# Patient Record
Sex: Male | Born: 1965
Health system: Southern US, Community
[De-identification: ages and names within clinical notes are randomized; demographics above are authoritative.]

## PROBLEM LIST (undated history)

## (undated) ENCOUNTER — Emergency Department (HOSPITAL_COMMUNITY): Discharge status: Absent without leave | Payer: Self-pay

## (undated) DIAGNOSIS — R011 Cardiac murmur, unspecified: Secondary | ICD-10-CM

## (undated) DIAGNOSIS — A938 Other specified arthropod-borne viral fevers: Secondary | ICD-10-CM

## (undated) DIAGNOSIS — F32A Depression, unspecified: Secondary | ICD-10-CM

## (undated) DIAGNOSIS — F1010 Alcohol abuse, uncomplicated: Secondary | ICD-10-CM

## (undated) DIAGNOSIS — I252 Old myocardial infarction: Secondary | ICD-10-CM

## (undated) DIAGNOSIS — G4730 Sleep apnea, unspecified: Secondary | ICD-10-CM

## (undated) DIAGNOSIS — I2510 Atherosclerotic heart disease of native coronary artery without angina pectoris: Secondary | ICD-10-CM

## (undated) DIAGNOSIS — F419 Anxiety disorder, unspecified: Secondary | ICD-10-CM

## (undated) HISTORY — DX: Depression, unspecified: F32.A

## (undated) HISTORY — DX: Anxiety disorder, unspecified: F41.9

## (undated) HISTORY — PX: KNEE SURGERY: SHX244

## (undated) HISTORY — PX: HERNIA REPAIR: SHX51

## (undated) HISTORY — DX: Sleep apnea, unspecified: G47.30

## (undated) HISTORY — DX: Cardiac murmur, unspecified: R01.1

---

## 1998-04-04 ENCOUNTER — Emergency Department (HOSPITAL_COMMUNITY): Admission: EM | Admit: 1998-04-04 | Discharge: 1998-04-04 | Payer: Self-pay | Admitting: Emergency Medicine

## 2000-09-08 ENCOUNTER — Emergency Department (HOSPITAL_COMMUNITY): Admission: EM | Admit: 2000-09-08 | Discharge: 2000-09-08 | Payer: Self-pay | Admitting: Emergency Medicine

## 2000-09-08 ENCOUNTER — Encounter: Payer: Self-pay | Admitting: Emergency Medicine

## 2000-11-02 ENCOUNTER — Encounter: Payer: Self-pay | Admitting: Emergency Medicine

## 2000-11-02 ENCOUNTER — Emergency Department (HOSPITAL_COMMUNITY): Admission: EM | Admit: 2000-11-02 | Discharge: 2000-11-02 | Payer: Self-pay | Admitting: Emergency Medicine

## 2000-12-30 ENCOUNTER — Encounter: Payer: Self-pay | Admitting: *Deleted

## 2000-12-30 ENCOUNTER — Emergency Department (HOSPITAL_COMMUNITY): Admission: EM | Admit: 2000-12-30 | Discharge: 2000-12-30 | Payer: Self-pay | Admitting: Emergency Medicine

## 2002-10-19 ENCOUNTER — Emergency Department (HOSPITAL_COMMUNITY): Admission: EM | Admit: 2002-10-19 | Discharge: 2002-10-20 | Payer: Self-pay | Admitting: Emergency Medicine

## 2002-10-19 ENCOUNTER — Encounter: Payer: Self-pay | Admitting: Emergency Medicine

## 2003-08-13 ENCOUNTER — Emergency Department (HOSPITAL_COMMUNITY): Admission: EM | Admit: 2003-08-13 | Discharge: 2003-08-13 | Payer: Self-pay | Admitting: Emergency Medicine

## 2003-12-08 ENCOUNTER — Emergency Department (HOSPITAL_COMMUNITY): Admission: EM | Admit: 2003-12-08 | Discharge: 2003-12-09 | Payer: Self-pay | Admitting: Emergency Medicine

## 2003-12-11 ENCOUNTER — Inpatient Hospital Stay (HOSPITAL_COMMUNITY): Admission: EM | Admit: 2003-12-11 | Discharge: 2003-12-18 | Payer: Self-pay | Admitting: *Deleted

## 2005-05-25 ENCOUNTER — Emergency Department (HOSPITAL_COMMUNITY): Admission: EM | Admit: 2005-05-25 | Discharge: 2005-05-26 | Payer: Self-pay | Admitting: Emergency Medicine

## 2007-08-04 ENCOUNTER — Emergency Department (HOSPITAL_COMMUNITY): Admission: EM | Admit: 2007-08-04 | Discharge: 2007-08-04 | Payer: Self-pay | Admitting: Emergency Medicine

## 2007-09-02 ENCOUNTER — Encounter: Payer: Self-pay | Admitting: Internal Medicine

## 2007-09-02 ENCOUNTER — Ambulatory Visit: Payer: Self-pay | Admitting: Pulmonary Disease

## 2007-09-02 ENCOUNTER — Ambulatory Visit: Payer: Self-pay | Admitting: Internal Medicine

## 2007-09-02 ENCOUNTER — Inpatient Hospital Stay (HOSPITAL_COMMUNITY): Admission: EM | Admit: 2007-09-02 | Discharge: 2007-09-10 | Payer: Self-pay | Admitting: Emergency Medicine

## 2007-09-03 ENCOUNTER — Encounter: Payer: Self-pay | Admitting: Internal Medicine

## 2007-09-03 ENCOUNTER — Encounter (INDEPENDENT_AMBULATORY_CARE_PROVIDER_SITE_OTHER): Payer: Self-pay | Admitting: Internal Medicine

## 2007-09-03 LAB — CONVERTED CEMR LAB
HCV Ab: NEGATIVE
Hep B C IgM: NEGATIVE
TSH: 2.531 microintl units/mL

## 2007-09-08 ENCOUNTER — Encounter: Payer: Self-pay | Admitting: Pulmonary Disease

## 2007-09-09 ENCOUNTER — Encounter: Payer: Self-pay | Admitting: Pulmonary Disease

## 2007-09-10 ENCOUNTER — Encounter: Payer: Self-pay | Admitting: Internal Medicine

## 2007-09-10 ENCOUNTER — Telehealth: Payer: Self-pay | Admitting: *Deleted

## 2007-09-10 ENCOUNTER — Encounter: Payer: Self-pay | Admitting: Pulmonary Disease

## 2007-09-10 LAB — CONVERTED CEMR LAB
CO2: 26 meq/L
Calcium: 8.9 mg/dL
Chloride: 103 meq/L
Creatinine, Ser: 1.33 mg/dL
Hemoglobin: 13.4 g/dL
Platelets: 293 10*3/uL
Sodium: 136 meq/L
WBC, blood: 4.1 10*3/uL

## 2007-09-16 ENCOUNTER — Encounter: Payer: Self-pay | Admitting: Internal Medicine

## 2007-09-16 DIAGNOSIS — R071 Chest pain on breathing: Secondary | ICD-10-CM

## 2007-09-16 DIAGNOSIS — A5139 Other secondary syphilis of skin: Secondary | ICD-10-CM

## 2007-09-17 ENCOUNTER — Ambulatory Visit: Payer: Self-pay | Admitting: Hospitalist

## 2007-09-30 ENCOUNTER — Ambulatory Visit: Payer: Self-pay | Admitting: Infectious Disease

## 2008-09-06 ENCOUNTER — Emergency Department (HOSPITAL_COMMUNITY): Admission: EM | Admit: 2008-09-06 | Discharge: 2008-09-06 | Payer: Self-pay | Admitting: Emergency Medicine

## 2009-03-27 ENCOUNTER — Emergency Department (HOSPITAL_COMMUNITY): Admission: EM | Admit: 2009-03-27 | Discharge: 2009-03-27 | Payer: Self-pay | Admitting: Emergency Medicine

## 2010-01-14 ENCOUNTER — Emergency Department (HOSPITAL_COMMUNITY): Admission: EM | Admit: 2010-01-14 | Discharge: 2010-01-14 | Payer: Self-pay | Admitting: Emergency Medicine

## 2010-09-07 LAB — POCT I-STAT, CHEM 8
Calcium, Ion: 1.13 mmol/L (ref 1.12–1.32)
Chloride: 98 mEq/L (ref 96–112)
TCO2: 28 mmol/L (ref 0–100)

## 2010-11-05 NOTE — Discharge Summary (Signed)
NAMECESAR, Dean Howard            ACCOUNT NO.:  [id number]   MEDICAL RECORD NO.:  [id number]          PATIENT TYPE:  INP   LOCATION:  5509                         FACILITY:  [facility]   PHYSICIAN:  Madaline Guthrie, M.D.    DATE OF BIRTH:  [birth year]   DATE OF ADMISSION:  09/02/2007  DATE OF DISCHARGE:  09/10/2007                               DISCHARGE SUMMARY   DIAGNOSES AT DISCHARGE:  1. Constitutional syndrome, including weight loss, diffuse      lymphadenopathy, alopecia.  2. Secondary syphilis.  3. Atypical pneumonia, bilateral.   DISCHARGE MEDICATION:  Ibuprofen 600 mg p.o. t.i.d. for 2-4 weeks.   DISPOSITION AND FOLLOWUP:  The patient is to follow up on Friday, September 17, 2007, at the outpatient clinic for benzathine penicillin 1.2 million  units IM in each buttock x1 (that is benzathine penicillin 2.4 million  mmu total).  The patient is also to follow up in the outpatient clinic  on September 30, 2007, at 3:15 p.m. for a hospital followup.  During the  visit, the patient's rash should be assessed along with right-sided  pleuritic chest pain and alopecia.   PROCEDURES PERFORMED:  The patient had a bronchoscopy by Dr. Vassie Loll on  September 09, 2007.  Findings:  Upper airway appeared normal.  Vocal cords  have normal appearance and motion.  All segmental and subsegmental  airways appeared normal.  No endobronchial secretions or lesions seen.  BAL obtained from right lower lobe.  Brushings from posteromedial  segment of right lower lobe under fluoroscopy, transbronchial biopsy x3  from the same area.   CONSULTATION:  Comer Locket. Vassie Loll, MD, from [facility].   BRIEF ADMITTING HISTORY AND PHYSICAL:  Dean Howard is a pleasant 45-  year-old man with a past medical history of [place] spotted fever  at age 33, asthma, and collar-button abscess with MRSA-positive  cultures.  He now comes in with right-sided pleuritic chest pain.  Pain  has been constant since January,  initially throbbing, now sharp, no  palliation including ibuprofen 800 mg, worsened with deep breaths,  movement, cough, and sneezing.  Currently, pain is 8/10 in intensity,  10/10 at its worst.  The patient has never had this pain before and it  does not radiate, except occasionally and present on the left side as  well.  The patient also has hoarseness of his voice, shortness of  breath, productive cough with yellow sputum, and thinning of hair since  January.  Of note, all these symptoms started after a diffuse papular  rash erupted on the chest and spread out to the limbs in November 2008.  At that time, the patient states also he had an increased watery,  itching, and swelling of the eyes.  The rash was pruritic, papular, and  did not involve the palms, soles, or mouth.  The patient also had  pronounced subjective weight loss.  He denies any fevers, chills,  abdominal pain, or recent travel.  The patient does have a history of  staying in a shelter, jail, and recent HIV test (never followed up with  results) and PPD  negative per the patient in January 2009.   PHYSICAL EXAMINATION:  Temperature 98.1, blood pressure 113/74, pulse  69, and O2 saturation 99% on room air.  GENERAL:  The patient was in no acute distress, middle-aged male.  EYES:  Bilateral palpebral edema.  Sclerae were anicteric.  Positive  bilateral gaze nystagmus.  ENT:  Oropharynx was erythematous, but no plaques, edema, or exudates.  Loss of external eyebrows noticed.  NECK:  Supple, diffuse submandibular and submental lymphadenopathy.  Also, a cervical lymphadenopathy noted.  No JVD.  No carotid bruits.  CHEST:  Respiration with good air movement, but slightly decreased at  bases with bilateral basilar crackles, right greater than left.  The  patient appeared in moderate respiratory distress, was slightly  tachypneic pausing to catch his breath.  CARDIOVASCULAR:  Regular rate and rhythm.  No murmurs, gallops, or  rubs.  Peripheral pulses 2+.  GI:  Positive bowel sounds.  Soft, nontender, nondistended.  No palpable  masses or organomegaly.  EXTREMITIES:  No clubbing, dark macules on the bottom of the feet that  were nonblanching.  GU:  No CVA tenderness, dry/desquamative rash on glans penis.  SKIN:  Diffuse healing papular rash with a white center and excoriation  changes secondary to scratching.  LYMPHATICS:  There were no cervical/trochlear/axillary lymph nodes  palpated.  Shotty lymphadenopathy noted again at the submental,  submandibular, cervical, and bilateral groin.  NEURO:  The patient was awake, alert, and oriented x3.  Cranial nerves  II through XII grossly intact.  Motor intact.  Sensation intact.  Cerebellar grossly intact.  PSYCH:  Appropriate.   LABORATORIES ON ADMISSION:  Sodium 136, potassium 4.0, chloride 101,  bicarbonate 27, BUN 11, creatinine 1.23, glucose 74, bilirubin 0.8,  alkaline phosphatase 316, AST 43, ALT 39.  Protein 8.3, albumin 3.5,  calcium 9.4.  WBC 5.1, ANC 2.7, hemoglobin 14.8, MCV 85, platelets  270,000.   HOSPITAL COURSE BY PROBLEMS:  1. Right-sided pleuritic chest pain with productive cough.  It was      felt likely secondary to pneumonia given symptoms and chest x-ray      with bilateral lower lobe densities predominantly involving the      right lower lobe.  The patient initially was put on Avelox for      likely atypical process given length of the symptoms.  Other      opportunistic infections were considered given HIV status unknown.      Therefore, HIV antibody was ordered and was negative.  Given HIV      seronegativity, the patient was to complete a course of Avelox      which he did so throughout the hospitalization.  The patient was      also put on respiratory isolation for possible TB.  Sputum was sent      for AFB and PCP staining and culture.  TB smears were negative.      First AFB stain was negative, and no other respiratory cultures       were obtainable.  The patient was also checked for syphilis with a      positive RPR with titer of 1:256.  This was confirmed with a TPA      that was positive with value greater than 8.  Given the patient's      symptoms are not greatly improved as far as the right-sided      pleuritic chest pain, pulmonary was consulted since CT angiogram  demonstrated no pulmonary embolus and persistent bibasilar airspace      disease with slight improvement on the right.  Pulmonary was      consulted for bronchoscopy for BAL with AFB stains and culture.      The patient had bronchoscopy on September 09, 2007, which was normal in      appearance.  The patient had a BAL sent for AFB, fungal, and      cytology.  Given the patient's AFB stains were negative from the      bronchoscopy, the patient was discharged on 600 mg p.o. t.i.d. for      2-4 weeks with presumptive pleurisy from questionable atypical      pneumonia in the past.  The patient is to follow up in the clinic      to assess the resolution of symptoms.  Of note, the patient also      had a C-reactive protein done that was elevated at 1.5.  Urine      Legionella antigen that was negative.  Acute hepatitis panel that      was negative.  TSH was within normal limits, as well as the T4.      GC/Chlamydia urine probes were both negative.  Epstein-Barr virus      serum PCR was negative.  2. Secondary syphilis.  Given the patient's seropositivity with RPR, a      titer of 1:256, and positive TPA, it was felt that the patient's      constitutional symptoms including weight loss, lymphadenopathy, and      alopecia were all due to the secondary syphilis.  The patient was      treated with benzathine penicillin weekly for 3 doses.  The patient      received 2 of the doses in the hospital and is to follow up on      Friday, September 17, 2007, in the outpatient clinic for the third dose      of benzathine penicillin.  The patient did state improvement in  all      the constitutional symptoms.  No further workup was pursued given      likelihood of secondary syphilis as a source of the constitutional      symptoms.   LABORATORIES ON DISCHARGE:  Sodium 136, potassium 4.0, chloride 103,  bicarbonate 26, glucose 88, BUN 15, creatinine 1.33, and calcium 8.9.  WBC 4.1, hemoglobin 13.4, and platelets 293,000.      Mariea Stable, MD  Electronically Signed      Madaline Guthrie, M.D.  Electronically Signed    MA/MEDQ  D:  09/16/2007  T:  09/17/2007  Job:  [id number]   cc:   Oretha Milch, MD  Mariea Stable, MD

## 2010-11-05 NOTE — Consult Note (Signed)
NAMEDILLIAN, FEIG            ACCOUNT NO.:  [id number]   MEDICAL RECORD NO.:  [id number]          PATIENT TYPE:  INP   LOCATION:  5509                         FACILITY:  [facility]   PHYSICIAN:  Oretha Milch, MD      DATE OF BIRTH:  [birth year]   DATE OF CONSULTATION:  09/08/2007  DATE OF DISCHARGE:                                 CONSULTATION   REFERRING PHYSICIAN:  Mariea Stable, MD   REASON FOR CONSULTATION:  Bronchoscopy for unresolved pneumonia.   HISTORY OF PRESENT ILLNESS:  Mr. Mathey is a 45 year old African  American gentleman who presented with right-sided anterior pleuritic  chest pain. He states that he does not go into the hospital unless there  is a serious problem. This has been constant and ongoing since January.  Ibuprofen does not help much. It is worse with deep breaths and with  sneezing. Occasionally it radiates to his back. He also presented with a  diffuse papular rash, pruritic, weight loss of about 20 pounds, and hair  loss noted from the scalp and his eyebrows. He was found to have an RPR  of 1: 256  and a positive FT antibody and was treated with benzathine  penicillin for secondary syphilis. Sputum x1 was negative for AFB and  for pneumocystis. HIV was negative. Hepatitis panel was negative.  Legionella antigen in the urine was negative. Sputum culture has been  positive for streptococcus group G. Chest x-ray showed bibasilar  airspace disease. CT of the chest showed bilateral lower lobe  consolidations and axillary lymphadenopathy. CT of the abdomen and  pelvis showed diffuse wall thickening of the transverse and left colon  suggestive of colitis. There was mild inguinal lymphadenopathy and  possible sacroiliitis.   We are consulted for a bronchoscopy to rule out TB in view of his  persistent pleuritic chest pain. He has been treated with Avelox for the  past five days. PPD  status is negative by report.   PAST MEDICAL HISTORY:  1. [place]  [place] spotted fever at age 89.  2. Asthma.  3. collar stud  abscess in his left hand status post incision and      drainage by Dr. Amanda Pea in June 2005.   ALLERGIES:  None.   CURRENT MEDICATIONS:  1. Benzathine penicillin.  2. Moxifloxacin 400 mg IV every 24.  3. __________  1 to 2 tablets every 4 hours p.r.n.  4. Diphenhydramine/Benadryl 25 mg IV every 4 hours p.r.n.   SOCIAL HISTORY:  He smokes about half a pack per day. He used to work in  [employer] and moved to [place] to live with his parents November  2008. He has lived in the shelter in the past and has been incarcerated  for threatening my girlfriend.   FAMILY HISTORY:  Breast cancer in his mother, prostate cancer in his  father.   REVIEW OF SYSTEMS:  Twenty pound weight loss in the last three months.  Right-sided anterior chest pain occasionally radiating to his back.  Denies dyspnea, cough, sputum production. He has a papular rash that  started on his chest and spread  to his legs and arms involving the palms  and soles. Pruritic rash  as per patient.   PHYSICAL EXAMINATION:  Alert gentleman setting up in bed in no acute  respiratory  distress. Afebrile since admission. HR 75 respirations 18,  blood pressure BP 132  /61, oxygen saturation 97% on room air.  HEENT:  No thrush. Pupils 3mm and reactive to light. No pallor. No  icterus.  NECK:  Supple. No JVD. No meningeal signs. __________ .  CARDIOVASCULAR:  S1, S2 normal.  CHEST:  Crackles at the left base.  ABDOMEN:  Soft, nontender.  NEUROLOGIC:  Nonfocal.  EXTREMITIES:  No edema.  SKIN:  Diffuse healing papular rash with signs of excoriation and some  scaling over the right arm.   LABORATORY DATA:  WBC 3.6, hemoglobin 13.2, platelets 305, BUN and  creatinine 9/1.2, sodium 139, potassium 4, chloride 105, bicarbonate 27,  calcium 9.4.   IMPRESSION:  1. Persistent bibasilar airspace disease.  2. Secondary syphilis treated with penicillin which explains  the rash      and the lymphadenopathy and his systemic symptoms.  3. Pleuritic pain and weight loss remain unexplained.   RECOMMENDATION:  As per request of the medical team, bronchoscopy will  be performed. We will obtain specimens from the right lower lobe with  lavage. Transbronchial biopsy was discussed with the patient. The risks  of the procedure including coughing, bleeding, and the small chance of  lung puncture requiring a chest tube were discussed, and he evidenced  understanding.      Oretha Milch, MD  Electronically Signed     RVA/MEDQ  D:  09/08/2007  T:  09/08/2007  Job:  [id number]

## 2010-11-06 ENCOUNTER — Emergency Department (HOSPITAL_COMMUNITY)
Admission: EM | Admit: 2010-11-06 | Discharge: 2010-11-07 | Disposition: A | Discharge status: Home / Self Care | Payer: Self-pay | Attending: Emergency Medicine | Admitting: Emergency Medicine

## 2010-11-06 ENCOUNTER — Emergency Department (HOSPITAL_COMMUNITY): Payer: Self-pay

## 2010-11-06 DIAGNOSIS — IMO0002 Reserved for concepts with insufficient information to code with codable children: Secondary | ICD-10-CM | POA: Insufficient documentation

## 2010-11-06 DIAGNOSIS — X58XXXA Exposure to other specified factors, initial encounter: Secondary | ICD-10-CM | POA: Insufficient documentation

## 2010-11-06 DIAGNOSIS — R21 Rash and other nonspecific skin eruption: Secondary | ICD-10-CM | POA: Insufficient documentation

## 2010-11-06 DIAGNOSIS — S91309A Unspecified open wound, unspecified foot, initial encounter: Secondary | ICD-10-CM | POA: Insufficient documentation

## 2010-11-08 NOTE — Op Note (Signed)
NAME:  Dean Howard, Dean Howard                      ACCOUNT NO.:  [id number]   MEDICAL RECORD NO.:  [id number]                   PATIENT TYPE:  INP   LOCATION:  6713                                 FACILITY:  [facility]   PHYSICIAN:  Dionne Ano. Everlene Other, M.D.         DATE OF BIRTH:  [birth year]   DATE OF PROCEDURE:  12/13/2003  DATE OF DISCHARGE:                                 OPERATIVE REPORT   PREOPERATIVE DIAGNOSIS:  Fourth web space collar button abscess.   POSTOPERATIVE DIAGNOSIS:  Fourth web space collar button abscess.   PROCEDURE:  Irrigation and debridement deep abscess left hand, collar button  abscess in nature.   SURGEON:  Dionne Ano. Amanda Pea, M.D.   ASSISTANT:  Karie Chimera, P.A.-C.   COMPLICATIONS:  None.   ANESTHESIA:  General.   SPECIMENS:  Aerobic and anaerobic cultures taken.   DRAINS:  Placed.   COMPLICATIONS:  None.   INDICATIONS FOR PROCEDURE:  The patient is a 45 year old male who presents  for I&D of a collar button abscess that had previous I&D 48 hours ago.  Given the wound conditions, I feel it is imperative to wash him out again as  this is a fairly significant infection with continued drainage and positive  Staph aureus abundant growing.  He understands the risks and benefits and  desires to proceed.   OPERATIVE PROCEDURE:  The patient was seen by myself and anesthesia, taken  to the operating room where he underwent a smooth induction of general  anesthesia.  He was laid supine, appropriately padded, prepped and draped in  the usual sterile fashion.  I then entered the previously incised area.  I  extended the incision somewhat and removed skin, subcutaneous tissue, and  any prenecrotic tissue.  I then went dorsal and volar to the intermetacarpal  ligament and identified some reaccumulation.  I then copiously irrigated  with greater than 2 liters of saline, packed the wound with Iodoform, and  did not close the wound, of course.  The extensor  apparatus was identified  and there was no significant substance in the sheath.  Following this, the  patient was packed nicely, placed in a sterile splint, extubated and taken  to the recovery room.  We will monitor him closely and continue IV  antibiotics, elevation, and range of motion to the fingers.  All questions  have been encouraged and answered.                                               Dionne Ano. Everlene Other, M.D.    Nash Mantis  D:  12/13/2003  T:  12/14/2003  Job:  [id number]

## 2010-11-08 NOTE — Discharge Summary (Signed)
NAME:  ASIA, FAVATA                      ACCOUNT NO.:  [id number]   MEDICAL RECORD NO.:  [id number]                   PATIENT TYPE:  INP   LOCATION:  6713                                 FACILITY:  [facility]   PHYSICIAN:  Dionne Ano. Amanda Pea, M.D.             DATE OF BIRTH:  [birth year]   DATE OF ADMISSION:  12/11/2003  DATE OF DISCHARGE:  12/18/2003                                 DISCHARGE SUMMARY   ADMITTING DIAGNOSIS:  Left hand collar button abscess.   DISCHARGE DIAGNOSIS:  Left hand collar button abscess, improved.   SURGEON:  Dionne Ano. Amanda Pea, M.D.   PROCEDURE:  I&D x2 of the left collar button abscess.   CONSULTS:  None.   BRIEF HOSPITAL COURSE:  Mr. Orourke is a 45 year old gentleman who was  noted to have a deep abscess about the left 4th web space; this had been  present many days prior to this admittance at the hospital, had  significantly worsened with ascending cellulitis, lymphangitis and pain, and  was seen and evaluated by Dr. Amanda Pea.  It was determined that the patient  certainly needed surgical intervention in the form of an I&D with most  likely a repeat washout.  The patient's preoperative labs were obtained and  he was stable to undergo surgery.   HOSPITAL COURSE:  The patient was admitted on December 11, 2003 and underwent an  I&D of the left hand 4th web space; he was noted to have a deep collar  button abscess; he tolerated this quite well.  He was admitted for standard  postoperative care including IV antibiotics in the form of clindamycin as  well as elevation, edema control and hydrotherapy.  Given the presentation,  certainly a repeat washout was in order and would be planned within 24-48  hours.  On postoperative day #2, the patient was doing well, he was alert  and oriented, vital signs were stable, he was afebrile and the wound was  still quite edematous, but without significant drainage.  Preliminary  cultures showed Staph aureus; his antibiotic  regime was changed to  vancomycin to prophylactically cover this, given the fact this was more than  likely MRSA, given his presentation.  He underwent a repeat I&D without  complications.  On postoperative day #3, he was doing well, he had minimal  tenderness, he had no nausea, vomiting, fever or chills, vital signs were  stable and he was afebrile.  Dressings were removed; he had minimal  discharge, more sanguineous discharge, but no purulent discharge was  expressed.  He had moderate edema.  Neurovascularly, he was intact,  sensation intact and he had excellent range of motion without signs of  Kanavel's.  Final cultures were obtained that showed abundant MRSA,  sensitive to vancomycin, gentamicin, rifampin and tetracycline; clindamycin  was pending.  The patient continued whirlpool and therapeutic endeavors  throughout his stay and on December 18, 2003, he was doing much better, he  was  improved overall, his vital signs were stable and he was afebrile.  The  decision was made to discharge him home.   ASSESSMENT:  Status post I&D x2 of left fourth web space abscess with  cultures indicative of MRSA.   PLAN:   CONDITION ON DISCHARGE:  Improved.   DIET:  Diet is regular.   ACTIVITIES:  He will keep his dressings clean, dry and intact, but will need  b.i.d. dressing changes, wet-to-dry, and therapists have instructed him on  how to perform wet-to-dry changes and have provided him with home supplies  including saline.  He will continue range of motion endeavors.   DISCHARGE MEDICATIONS:  His discharge medications will be:  1.  Doxycycline 100 mg 1 p.o. b.i.d. x3 weeks.  2.  He was given Vicodin 5/500 mg 1-2 p.o. q.4-6 h. p.r.n. pain, #30 of      those.   FOLLOWUP:  Will follow up with Dr. Amanda Pea in 1 week.      Karie Chimera, P.A.-C.                   Dionne Ano. Amanda Pea, M.D.    BB/MEDQ  D:  03/01/2004  T:  03/02/2004  Job:  [id number]

## 2010-11-08 NOTE — Op Note (Signed)
NAME:  Dean Howard, Dean Howard                      ACCOUNT NO.:  [id number]   MEDICAL RECORD NO.:  [id number]                   PATIENT TYPE:  INP   LOCATION:  6713                                 FACILITY:  [facility]   PHYSICIAN:  Dionne Ano. Everlene Other, M.D.         DATE OF BIRTH:  Nov 01, 1965   DATE OF PROCEDURE:  12/11/2003  DATE OF DISCHARGE:                                 OPERATIVE REPORT   PREOPERATIVE DIAGNOSIS:  Collar button abscess, left hand about the fourth  web.   POSTOPERATIVE DIAGNOSIS:  Collar button abscess, left hand about the fourth  web.   OPERATION PERFORMED:  Incision and drainage collar button abscess, fourth  webspace.   SURGEON:  Dionne Ano. Amanda Pea, M.D.   ASSISTANT:  Karie Chimera, P.A.-C.   ANESTHESIA:  Local with IV sedation as necessary.   COMPLICATIONS:  None.   TOURNIQUET TIME:  0.   CULTURES:  Aerobic and anaerobic taken.   INDICATIONS FOR PROCEDURE:  The patient is a 45 year old male who presents  with the above mentioned diagnosis.  I have counseled the patient in regard  to the risks and benefits of surgery and he desires to proceed.  I discussed  with him he had an abscess.  I would recommend drainage.  This abscess  unfortunately occurred many days ago.  This is significantly worsened with  ascending cellulitis, lymphangitis and pain.  He has no frank Kanavel's  signs but certainly has signs indicative of collar button abscess.  The  patient notes no unusual exposures.  He does note a puncture wound history  10 days ago he feels.   DESCRIPTION OF PROCEDURE:  The patient was seen by myself and was thoroughly  evaluated.  He underwent consent for operative intervention.  He  appropriately consented to the procedure of course and following this, we  performed a local block with lidocaine without epinephrine.  Following this,  he underwent a Betadine scrub and paint and once a sterile field was  secured, underwent incision.  The incision was  taken through the skin  following going through the dermal, tissue.  We then encountered an abscess  in the subcutaneous tissues and deeper tissues.  I swept a hemostat on  either side of the intermetacarpal ligament freeing up the abscess which was  very prominent and noticeable. Following this, aerobic and anaerobic  cultures were taken and sent.  Once this was done, he underwent copious  thorough lavage  with saline.  Following this, the wound was packed open  with iodoform gauze.  Once this done, he then underwent application of  sterile dressing.  Following this, the patient was then admitted to  the hospital for IV antibiotics, elevation, dressing changes, pain control,  etc.  He has a significant infection.  He will need to be admitted for  intravenous antibiotics, elevation, general precautions, possible second  wash out, etc.  All questions have been encouraged and answered.  Dionne Ano. Everlene Other, M.D.    Nash Mantis  D:  12/11/2003  T:  12/12/2003  Job:  [id number]

## 2011-03-14 LAB — I-STAT 8, (EC8 V) (CONVERTED LAB)
Acid-Base Excess: 4 — ABNORMAL HIGH
Hemoglobin: 15.3
Operator id: 198171
TCO2: 31
pCO2, Ven: 47.3
pH, Ven: 7.404 — ABNORMAL HIGH

## 2011-03-14 LAB — POCT I-STAT CREATININE: Creatinine, Ser: 1.4

## 2011-03-17 LAB — COMPREHENSIVE METABOLIC PANEL
ALT: 39
Albumin: 3.5
Alkaline Phosphatase: 316 — ABNORMAL HIGH
Calcium: 9.4
Chloride: 101
Creatinine, Ser: 1.23
Glucose, Bld: 74
Sodium: 136
Total Bilirubin: 0.8
Total Protein: 8.3

## 2011-03-17 LAB — DIFFERENTIAL
Basophils Absolute: 0.1
Basophils Relative: 1
Basophils Relative: 1
Eosinophils Absolute: 0.4
Eosinophils Absolute: 0.3
Eosinophils Relative: 7 — ABNORMAL HIGH
Lymphocytes Relative: 29
Lymphocytes Relative: 35
Lymphs Abs: 1.5
Lymphs Abs: 1.4
Monocytes Absolute: 0.5
Monocytes Relative: 11
Monocytes Relative: 13 — ABNORMAL HIGH
Monocytes Relative: 16 — ABNORMAL HIGH
Neutro Abs: 2.7
Neutro Abs: 1.6 — ABNORMAL LOW
Neutrophils Relative %: 53
Neutrophils Relative %: 31 — ABNORMAL LOW
Neutrophils Relative %: 40 — ABNORMAL LOW

## 2011-03-17 LAB — BASIC METABOLIC PANEL
BUN: 11
BUN: 10
BUN: 12
CO2: 26
CO2: 27
CO2: 29
Calcium: 8.9
Calcium: 8.9
Calcium: 9.4
Chloride: 104
Chloride: 103
Chloride: 103
Chloride: 106
Creatinine, Ser: 1.22
Creatinine, Ser: 1.22
Creatinine, Ser: 1.23
Creatinine, Ser: 1.33
GFR calc Af Amer: >60
GFR calc Af Amer: >60
GFR calc Af Amer: >60
GFR calc non Af Amer: >60
GFR calc non Af Amer: 60 — ABNORMAL LOW
Glucose, Bld: 85
Glucose, Bld: 85
Potassium: 4.1
Sodium: 138

## 2011-03-17 LAB — URINALYSIS, ROUTINE W REFLEX MICROSCOPIC
Glucose, UA: NEGATIVE
Hgb urine dipstick: NEGATIVE
Specific Gravity, Urine: 1.02

## 2011-03-17 LAB — AFB CULTURE WITH SMEAR (NOT AT ARMC): Acid Fast Smear: NONE SEEN

## 2011-03-17 LAB — CBC
HCT: 38 — ABNORMAL LOW
HCT: 43.5
Hemoglobin: 12.8 — ABNORMAL LOW
Hemoglobin: 14.8
MCHC: 33.5
MCHC: 34.1
MCV: 84.9
MCV: 85.7
MCV: 85
Platelets: 267
Platelets: 303
RBC: 4.44
RBC: 5.12
RBC: 4.54
RBC: 4.63
RDW: 18.1 — ABNORMAL HIGH
RDW: 17.9 — ABNORMAL HIGH
RDW: 18 — ABNORMAL HIGH
WBC: 5.1
WBC: 5.4
WBC: 4.1

## 2011-03-17 LAB — CARDIAC PANEL(CRET KIN+CKTOT+MB+TROPI)
CK, MB: 1.1
Relative Index: INVALID
Total CK: 73

## 2011-03-17 LAB — URINE CULTURE
Culture: NO GROWTH
Special Requests: NEGATIVE

## 2011-03-17 LAB — CULTURE, RESPIRATORY W GRAM STAIN: Gram Stain: NONE SEEN

## 2011-03-17 LAB — EXPECTORATED SPUTUM ASSESSMENT W GRAM STAIN, RFLX TO RESP C

## 2011-03-17 LAB — P CARINII SMEAR DFA

## 2011-03-17 LAB — HEPATITIS PANEL, ACUTE
Hep A IgM: NEGATIVE
Hep B C IgM: NEGATIVE
Hepatitis B Surface Ag: NEGATIVE

## 2011-03-17 LAB — GC/CHLAMYDIA PROBE AMP, URINE
Chlamydia, Swab/Urine, PCR: NEGATIVE
GC Probe Amp, Urine: NEGATIVE

## 2011-03-17 LAB — CULTURE, BLOOD (ROUTINE X 2): Culture: NO GROWTH

## 2011-03-17 LAB — LEGIONELLA ANTIGEN, URINE: Legionella Antigen, Urine: NEGATIVE

## 2011-03-17 LAB — FUNGUS CULTURE W SMEAR: Fungal Smear: NONE SEEN

## 2011-03-17 LAB — SYPHILIS: RPR W/REFLEX TO RPR TITER AND TREPONEMAL ANTIBODIES, TRADITIONAL SCREENING AND DIAGNOSIS ALGORITHM: RPR Ser Ql: REACTIVE — AB

## 2011-03-17 LAB — EPSTEIN BARR VIRUS(EBV) BY PCR: EBV DNA, PCR: NEGATIVE

## 2011-03-17 LAB — RPR TITER: RPR Titer: 1:256 {titer} — AB

## 2011-03-17 LAB — TSH: TSH: 2.531

## 2011-03-17 LAB — CK TOTAL AND CKMB (NOT AT ARMC)
CK, MB: 1
Total CK: 84

## 2011-03-17 LAB — HIV ANTIBODY (ROUTINE TESTING W REFLEX): HIV: NONREACTIVE

## 2011-03-17 LAB — TROPONIN I: Troponin I: 0.01

## 2011-03-17 LAB — T.PALLIDUM AB, IGG: T pallidum Antibodies (TP-PA): >8 — ABNORMAL HIGH

## 2011-03-17 LAB — T4, FREE: Free T4: 0.68 — ABNORMAL LOW

## 2014-01-18 ENCOUNTER — Emergency Department (HOSPITAL_COMMUNITY)
Admission: EM | Admit: 2014-01-18 | Discharge: 2014-01-18 | Disposition: A | Discharge status: Home / Self Care | Payer: Self-pay | Attending: Emergency Medicine | Admitting: Emergency Medicine

## 2014-01-18 ENCOUNTER — Encounter (HOSPITAL_COMMUNITY): Payer: Self-pay | Admitting: Emergency Medicine

## 2014-01-18 DIAGNOSIS — L253 Unspecified contact dermatitis due to other chemical products: Secondary | ICD-10-CM | POA: Insufficient documentation

## 2014-01-18 DIAGNOSIS — L259 Unspecified contact dermatitis, unspecified cause: Secondary | ICD-10-CM

## 2014-01-18 DIAGNOSIS — R21 Rash and other nonspecific skin eruption: Secondary | ICD-10-CM | POA: Insufficient documentation

## 2014-01-18 DIAGNOSIS — IMO0002 Reserved for concepts with insufficient information to code with codable children: Secondary | ICD-10-CM | POA: Insufficient documentation

## 2014-01-18 DIAGNOSIS — F17200 Nicotine dependence, unspecified, uncomplicated: Secondary | ICD-10-CM | POA: Insufficient documentation

## 2014-01-18 MED ORDER — PREDNISONE 20 MG PO TABS
60.0000 mg | ORAL_TABLET | Freq: Once | ORAL | Status: AC
  Administered 2014-01-18: 60 mg via ORAL
  Filled 2014-01-18: qty 3

## 2014-01-18 MED ORDER — PREDNISONE 20 MG PO TABS: ORAL_TABLET | ORAL | Status: DC

## 2014-01-18 NOTE — Discharge Instructions (Signed)
Please read and follow all provided instructions.  Your diagnoses today include:  1. Contact dermatitis     Tests performed today include:  Vital signs. See below for your results today.   Medications prescribed:   Prednisone - steroid medicine   It is best to take this medication in the morning to prevent sleeping problems. If you are diabetic, monitor your blood sugar closely and stop taking Prednisone if blood sugar is over 300. Take with food to prevent stomach upset.   Home care instructions:  Follow any educational materials contained in this packet.  Follow-up instructions: Please follow-up with your primary care provider as needed for further evaluation of your symptoms.  Return instructions:   Please return to the Emergency Department if you experience worsening symptoms.   Please return if you have any other emergent concerns.  Additional Information:  Your vital signs today were: BP 118/77   Pulse 66   Temp(Src) 97.6 F (36.4 C) (Oral)   Resp 17   Ht 6\' 3"  (1.905 m)   Wt 171 lb (77.565 kg)   BMI 21.37 kg/m2   SpO2 100% If your blood pressure (BP) was elevated above 135/85 this visit, please have this repeated by your doctor within one month. ---------------

## 2014-01-18 NOTE — ED Notes (Signed)
Pt reports rash to arms, neck and trunk x 2 weeks. Reports itching, rash looks like possible poison ivy. Airway intact.

## 2014-01-18 NOTE — ED Provider Notes (Signed)
Medical screening examination/treatment/procedure(s) were performed by non-physician practitioner and as supervising physician I was immediately available for consultation/collaboration.   EKG Interpretation None        William Linley Moskal, MD 01/18/14 1445 

## 2014-01-18 NOTE — ED Provider Notes (Signed)
CSN: [id number]     Arrival date & time 01/18/14  0754 History   First MD Initiated Contact with Patient 01/18/14 [phone]     Chief Complaint  Patient presents with  . Rash     (Consider location/radiation/quality/duration/timing/severity/associated sxs/prior Treatment) HPI Comments: Patients with history of itchy rash on exposed areas including upper extremities, lower extremities, neck, and forehead for one week. Patient works in [employer], [employer] and was spraying for weeds outside of a house one week ago prior to symptom onset. No pain. No eye involvement. Patient denies fever, nausea, vomiting. No tick bites or rash on palms or soles. Patient has been using over-the-counter cortisone cream with some relief of symptoms. No other symptoms of illness. Patient denies history of diabetes.  Patient is a 48 y.o. male presenting with rash. The history is provided by the patient.  Rash Associated symptoms: no fever, no myalgias, no nausea, no shortness of breath, not vomiting and not wheezing     History reviewed. No pertinent past medical history. History reviewed. No pertinent past surgical history. History reviewed. No pertinent family history. History  Substance Use Topics  . Smoking status: Current Every Day Smoker    Types: Cigarettes  . Smokeless tobacco: Not on file  . Alcohol Use: Yes    Review of Systems  Constitutional: Negative for fever.  HENT: Negative for facial swelling and trouble swallowing.   Eyes: Negative for redness.  Respiratory: Negative for shortness of breath, wheezing and stridor.   Cardiovascular: Negative for chest pain.  Gastrointestinal: Negative for nausea and vomiting.  Musculoskeletal: Negative for myalgias.  Skin: Positive for rash.  Neurological: Negative for light-headedness.  Psychiatric/Behavioral: Negative for confusion.      Allergies  Review of patient's allergies indicates no known allergies.  Home Medications   Prior to Admission  medications   Medication Sig Start Date End Date Taking? Authorizing Provider  hydrocortisone cream 1 % Apply 1 application topically as needed for itching.   Yes Historical Provider, MD  predniSONE (DELTASONE) 20 MG tablet 3 Tabs PO Days 1-3, then 2 tabs PO Days 4-6, then 1 tab PO Day 7-9, then Half Tab PO Day 10-12 01/18/14   Renne Crigler, PA-C   BP 118/77  Pulse 66  Temp(Src) 97.6 F (36.4 C) (Oral)  Resp 17  Ht 6\' 3"  (1.905 m)  Wt 171 lb (77.565 kg)  BMI 21.37 kg/m2  SpO2 100% Physical Exam  Nursing note and vitals reviewed. Constitutional: He appears well-developed and well-nourished.  HENT:  Head: Normocephalic and atraumatic.  Eyes: Conjunctivae are normal.  Neck: Normal range of motion. Neck supple.  Pulmonary/Chest: No respiratory distress.  Neurological: He is alert.  Skin: Skin is warm and dry.  No rash on palms or soles. Patient with scattered areas of vesicles, mostly in a linear pattern, over exposed areas of arms, lower legs, neck, forehead.  Psychiatric: He has a normal mood and affect.    ED Course  Procedures (including critical care time) Labs Review Labs Reviewed - No data to display  Imaging Review No results found.   EKG Interpretation None      8:17 AM Patient seen and examined. Medications ordered.   Vital signs reviewed and are as follows: Filed Vitals:   01/18/14 0801  BP: 118/77  Pulse: 66  Temp: 97.6 F (36.4 C)  Resp: 17   Patient denies history of diabetes, GI bleed, allergy to prednisone.  Patient urged to return with worsening symptoms or other concerns.  Patient verbalized understanding and agrees with plan.     MDM   Final diagnoses:  Contact dermatitis   Patients with linear vesicular rash consistent with contact dermatitis, likely due to plant exposure. No signs of anaphylaxis or other serious allergic reaction.    Renne CriglerJoshua Larraine Argo, PA-C 01/18/14 [phone]

## 2014-01-28 ENCOUNTER — Emergency Department (HOSPITAL_COMMUNITY): Payer: Self-pay

## 2014-01-28 ENCOUNTER — Encounter (HOSPITAL_COMMUNITY): Payer: Self-pay | Admitting: Emergency Medicine

## 2014-01-28 ENCOUNTER — Emergency Department (HOSPITAL_COMMUNITY)
Admission: EM | Admit: 2014-01-28 | Discharge: 2014-01-28 | Disposition: A | Discharge status: Home / Self Care | Payer: Self-pay | Attending: Emergency Medicine | Admitting: Emergency Medicine

## 2014-01-28 DIAGNOSIS — F17200 Nicotine dependence, unspecified, uncomplicated: Secondary | ICD-10-CM | POA: Insufficient documentation

## 2014-01-28 DIAGNOSIS — S0181XA Laceration without foreign body of other part of head, initial encounter: Secondary | ICD-10-CM

## 2014-01-28 DIAGNOSIS — S02109B Fracture of base of skull, unspecified side, initial encounter for open fracture: Secondary | ICD-10-CM | POA: Insufficient documentation

## 2014-01-28 DIAGNOSIS — S01409A Unspecified open wound of unspecified cheek and temporomandibular area, initial encounter: Secondary | ICD-10-CM | POA: Insufficient documentation

## 2014-01-28 DIAGNOSIS — S02401B Maxillary fracture, unspecified, initial encounter for open fracture: Secondary | ICD-10-CM

## 2014-01-28 MED ORDER — OXYCODONE-ACETAMINOPHEN 5-325 MG PO TABS
1.0000 | ORAL_TABLET | Freq: Once | ORAL | Status: AC
  Administered 2014-01-28: 1 via ORAL
  Filled 2014-01-28: qty 1

## 2014-01-28 MED ORDER — CEPHALEXIN 500 MG PO CAPS: 500.0000 mg | ORAL_CAPSULE | Freq: Four times a day (QID) | ORAL | Status: DC

## 2014-01-28 MED ORDER — OXYCODONE-ACETAMINOPHEN 5-325 MG PO TABS: 1.0000 | ORAL_TABLET | Freq: Four times a day (QID) | ORAL | Status: DC | PRN

## 2014-01-28 NOTE — Discharge Instructions (Signed)
Facial Fracture °A facial fracture is a break in one of the bones of your face. °HOME CARE INSTRUCTIONS  °· Protect the injured part of your face until it is healed. °· Do not participate in activities which give chance for re-injury until your doctor approves. °· Gently wash and dry your face. °· Wear head and facial protection while riding a bicycle, motorcycle, or snowmobile. °SEEK MEDICAL CARE IF:  °· An oral temperature above 102° F (38.9° C) develops. °· You have severe headaches or notice changes in your vision. °· You have new numbness or tingling in your face. °· You develop nausea (feeling sick to your stomach), vomiting or a stiff neck. °SEEK IMMEDIATE MEDICAL CARE IF:  °· You develop difficulty seeing or experience double vision. °· You become dizzy, lightheaded, or faint. °· You develop trouble speaking, breathing, or swallowing. °· You have a watery discharge from your nose or ear. °MAKE SURE YOU:  °· Understand these instructions. °· Will watch your condition. °· Will get help right away if you are not doing well or get worse. °Document Released: 06/09/2005 Document Revised: 09/01/2011 Document Reviewed: 01/27/2008 °ExitCare® Patient Information ©2015 [employer]. This information is not intended to replace advice given to you by your health care provider. Make sure you discuss any questions you have with your health care provider. ° °Facial Laceration ° A facial laceration is a cut on the face. These injuries can be painful and cause bleeding. Lacerations usually heal quickly, but they need special care to reduce scarring. °DIAGNOSIS  °Your health care provider will take a medical history, ask for details about how the injury occurred, and examine the wound to determine how deep the cut is. °TREATMENT  °Some facial lacerations may not require closure. Others may not be able to be closed because of an increased risk of infection. The risk of infection and the chance for successful closure will  depend on various factors, including the amount of time since the injury occurred. °The wound may be cleaned to help prevent infection. If closure is appropriate, pain medicines may be given if needed. Your health care provider will use stitches (sutures), wound glue (adhesive), or skin adhesive strips to repair the laceration. These tools bring the skin edges together to allow for faster healing and a better cosmetic outcome. If needed, you may also be given a tetanus shot. °HOME CARE INSTRUCTIONS °· Only take over-the-counter or prescription medicines as directed by your health care provider. °· Follow your health care provider's instructions for wound care. These instructions will vary depending on the technique used for closing the wound. °For Sutures: °· Keep the wound clean and dry.   °· If you were given a bandage (dressing), you should change it at least once a day. Also change the dressing if it becomes wet or dirty, or as directed by your health care provider.   °· Wash the wound with soap and water 2 times a day. Rinse the wound off with water to remove all soap. Pat the wound dry with a clean towel.   °· After cleaning, apply a thin layer of the antibiotic ointment recommended by your health care provider. This will help prevent infection and keep the dressing from sticking.   °· You may shower as usual after the first 24 hours. Do not soak the wound in water until the sutures are removed.   °· Get your sutures removed as directed by your health care provider. With facial lacerations, sutures should usually be taken out after   days to avoid stitch marks.   Wait a few days after your sutures are removed before applying any makeup. For Skin Adhesive Strips:  Keep the wound clean and dry.   Do not get the skin adhesive strips wet. You may bathe carefully, using caution to keep the wound dry.   If the wound gets wet, pat it dry with a clean towel.   Skin adhesive strips will fall off on  their own. You may trim the strips as the wound heals. Do not remove skin adhesive strips that are still stuck to the wound. They will fall off in time.  For Wound Adhesive:  You may briefly wet your wound in the shower or bath. Do not soak or scrub the wound. Do not swim. Avoid periods of heavy sweating until the skin adhesive has fallen off on its own. After showering or bathing, gently pat the wound dry with a clean towel.   Do not apply liquid medicine, cream medicine, ointment medicine, or makeup to your wound while the skin adhesive is in place. This may loosen the film before your wound is healed.   If a dressing is placed over the wound, be careful not to apply tape directly over the skin adhesive. This may cause the adhesive to be pulled off before the wound is healed.   Avoid prolonged exposure to sunlight or tanning lamps while the skin adhesive is in place.  The skin adhesive will usually remain in place for 5-10 days, then naturally fall off the skin. Do not pick at the adhesive film.  After Healing: Once the wound has healed, cover the wound with sunscreen during the day for 1 full year. This can help minimize scarring. Exposure to ultraviolet light in the first year will darken the scar. It can take 1-2 years for the scar to lose its redness and to heal completely.  SEEK IMMEDIATE MEDICAL CARE IF:  You have redness, pain, or swelling around the wound.   You see ayellowish-white fluid (pus) coming from the wound.   You have chills or a fever.  MAKE SURE YOU:  Understand these instructions.  Will watch your condition.  Will get help right away if you are not doing well or get worse. Document Released: 07/17/2004 Document Revised: 03/30/2013 Document Reviewed: 01/20/2013 [facility] Patient Information 2015 [place], [place]. This information is not intended to replace advice given to you by your health care provider. Make sure you discuss any questions you have with your  health care provider.
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
## 2014-01-28 NOTE — ED Notes (Signed)
Dr. Wilkie AyeHorton at bedside to suture laceration.

## 2015-05-23 ENCOUNTER — Emergency Department (HOSPITAL_COMMUNITY)
Admission: EM | Admit: 2015-05-23 | Discharge: 2015-05-23 | Disposition: A | Discharge status: Home / Self Care | Payer: Self-pay | Attending: Emergency Medicine | Admitting: Emergency Medicine

## 2015-05-23 ENCOUNTER — Encounter (HOSPITAL_COMMUNITY): Payer: Self-pay | Admitting: Emergency Medicine

## 2015-05-23 DIAGNOSIS — I252 Old myocardial infarction: Secondary | ICD-10-CM | POA: Insufficient documentation

## 2015-05-23 DIAGNOSIS — I2510 Atherosclerotic heart disease of native coronary artery without angina pectoris: Secondary | ICD-10-CM | POA: Insufficient documentation

## 2015-05-23 DIAGNOSIS — Z792 Long term (current) use of antibiotics: Secondary | ICD-10-CM | POA: Insufficient documentation

## 2015-05-23 DIAGNOSIS — Z87891 Personal history of nicotine dependence: Secondary | ICD-10-CM | POA: Insufficient documentation

## 2015-05-23 DIAGNOSIS — A084 Viral intestinal infection, unspecified: Secondary | ICD-10-CM | POA: Insufficient documentation

## 2015-05-23 HISTORY — DX: Old myocardial infarction: I25.2

## 2015-05-23 HISTORY — DX: Atherosclerotic heart disease of native coronary artery without angina pectoris: I25.10

## 2015-05-23 LAB — CBC
HEMATOCRIT: 42.4 % (ref 39.0–52.0)
HEMOGLOBIN: 14.6 g/dL (ref 13.0–17.0)
MCH: 31.5 pg (ref 26.0–34.0)
MCHC: 34.4 g/dL (ref 30.0–36.0)
MCV: 91.4 fL (ref 78.0–100.0)
Platelets: 149 10*3/uL — ABNORMAL LOW (ref 150–400)
RBC: 4.64 MIL/uL (ref 4.22–5.81)
RDW: 15 % (ref 11.5–15.5)
WBC: 3.1 10*3/uL — ABNORMAL LOW (ref 4.0–10.5)

## 2015-05-23 LAB — URINALYSIS, ROUTINE W REFLEX MICROSCOPIC
GLUCOSE, UA: NEGATIVE mg/dL
Hgb urine dipstick: NEGATIVE
KETONES UR: 40 mg/dL — AB
Nitrite: NEGATIVE
PH: 6 (ref 5.0–8.0)
Protein, ur: NEGATIVE mg/dL
SPECIFIC GRAVITY, URINE: 1.031 — AB (ref 1.005–1.030)

## 2015-05-23 LAB — COMPREHENSIVE METABOLIC PANEL
ALBUMIN: 3.4 g/dL — AB (ref 3.5–5.0)
ALK PHOS: 51 U/L (ref 38–126)
ALT: 69 U/L — ABNORMAL HIGH (ref 17–63)
ANION GAP: 8 (ref 5–15)
AST: 107 U/L — ABNORMAL HIGH (ref 15–41)
BILIRUBIN TOTAL: 1 mg/dL (ref 0.3–1.2)
BUN: 14 mg/dL (ref 6–20)
CALCIUM: 8.6 mg/dL — AB (ref 8.9–10.3)
CO2: 26 mmol/L (ref 22–32)
Chloride: 100 mmol/L — ABNORMAL LOW (ref 101–111)
Creatinine, Ser: 1.59 mg/dL — ABNORMAL HIGH (ref 0.61–1.24)
GFR calc Af Amer: 58 mL/min — ABNORMAL LOW (ref >60)
GFR, EST NON AFRICAN AMERICAN: 50 mL/min — AB (ref >60)
GLUCOSE: 86 mg/dL (ref 65–99)
POTASSIUM: 3.6 mmol/L (ref 3.5–5.1)
Sodium: 134 mmol/L — ABNORMAL LOW (ref 135–145)
TOTAL PROTEIN: 6.2 g/dL — AB (ref 6.5–8.1)

## 2015-05-23 LAB — URINE MICROSCOPIC-ADD ON

## 2015-05-23 MED ORDER — METOCLOPRAMIDE HCL 5 MG/ML IJ SOLN
10.0000 mg | INTRAMUSCULAR | Status: AC
  Administered 2015-05-23: 10 mg via INTRAVENOUS
  Filled 2015-05-23: qty 2

## 2015-05-23 MED ORDER — KETOROLAC TROMETHAMINE 30 MG/ML IJ SOLN
30.0000 mg | Freq: Once | INTRAMUSCULAR | Status: AC
  Administered 2015-05-23: 30 mg via INTRAVENOUS
  Filled 2015-05-23: qty 1

## 2015-05-23 MED ORDER — ONDANSETRON HCL 4 MG PO TABS: 4.0000 mg | ORAL_TABLET | Freq: Three times a day (TID) | ORAL | Status: DC | PRN

## 2015-05-23 MED ORDER — DIPHENHYDRAMINE HCL 50 MG/ML IJ SOLN
25.0000 mg | Freq: Once | INTRAMUSCULAR | Status: AC
  Administered 2015-05-23: 25 mg via INTRAVENOUS
  Filled 2015-05-23: qty 1

## 2015-05-23 MED ORDER — SODIUM CHLORIDE 0.9 % IV BOLUS (SEPSIS)
1000.0000 mL | Freq: Once | INTRAVENOUS | Status: AC
  Administered 2015-05-23: 1000 mL via INTRAVENOUS

## 2015-05-23 NOTE — ED Notes (Signed)
PA at bedside.

## 2015-05-23 NOTE — ED Provider Notes (Signed)
CSN: [id number]     Arrival date & time 05/23/15  [id number] History   First MD Initiated Contact with Patient 05/23/15 [phone]     Chief Complaint  Patient presents with  . Emesis  . Diarrhea  . Abdominal Pain     (Consider location/radiation/quality/duration/timing/severity/associated sxs/prior Treatment) HPI Comments: Patient is a 49 year old male who presents with a 3 day history of abdominal pain. The pain is located in his generalized abdomen and does not radiate. The pain is described as cramping and severe. The pain started gradually and progressively worsened since the onset. No alleviating/aggravating factors. The patient has tried nothing for symptoms without relief. Associated symptoms include NVD and headache. Patient denies fever, chest pain, SOB, dysuria, constipation. No history of abdominal surgery. No known sick contacts.   Patient is a 49 y.o. male presenting with vomiting, diarrhea, and abdominal pain.  Emesis Associated symptoms: abdominal pain and diarrhea   Associated symptoms: no arthralgias and no chills   Diarrhea Associated symptoms: abdominal pain and vomiting   Associated symptoms: no arthralgias, no chills and no fever   Abdominal Pain Associated symptoms: diarrhea, nausea and vomiting   Associated symptoms: no chest pain, no chills, no dysuria, no fatigue, no fever and no shortness of breath     Past Medical History  Diagnosis Date  . Coronary artery disease   . MI, old    Past Surgical History  Procedure Laterality Date  . Knee surgery     No family history on file. Social History  Substance Use Topics  . Smoking status: Current Every Day Smoker    Types: Cigarettes  . Smokeless tobacco: None  . Alcohol Use: Yes    Review of Systems  Constitutional: Negative for fever, chills and fatigue.  HENT: Negative for trouble swallowing.   Eyes: Negative for visual disturbance.  Respiratory: Negative for shortness of breath.   Cardiovascular: Negative  for chest pain and palpitations.  Gastrointestinal: Positive for nausea, vomiting, abdominal pain and diarrhea.  Genitourinary: Negative for dysuria and difficulty urinating.  Musculoskeletal: Negative for arthralgias and neck pain.  Skin: Negative for color change.  Neurological: Negative for dizziness and weakness.  Psychiatric/Behavioral: Negative for dysphoric mood.      Allergies  Review of patient's allergies indicates no known allergies.  Home Medications   Prior to Admission medications   Medication Sig Start Date End Date Taking? Authorizing Provider  cephALEXin (KEFLEX) 500 MG capsule Take 1 capsule (500 mg total) by mouth 4 (four) times daily. 01/28/14   Shon Batonourtney F Horton, MD  hydrocortisone cream 1 % Apply 1 application topically as needed for itching.    Historical Provider, MD  oxyCODONE-acetaminophen (PERCOCET/ROXICET) 5-325 MG per tablet Take 1 tablet by mouth every 6 (six) hours as needed for severe pain. 01/28/14   Shon Batonourtney F Horton, MD   BP 132/96 mmHg  Pulse 85  Temp(Src) 98.6 F (37 C)  Resp 16  Ht 6\' 2"  (1.88 m)  Wt 71.215 kg  BMI 20.15 kg/m2  SpO2 99% Physical Exam  Constitutional: He is oriented to person, place, and time. He appears well-developed and well-nourished. No distress.  HENT:  Head: Normocephalic and atraumatic.  Eyes: Conjunctivae and EOM are normal.  Neck: Normal range of motion.  Cardiovascular: Normal rate and regular rhythm.  Exam reveals no gallop and no friction rub.   No murmur heard. Pulmonary/Chest: Effort normal and breath sounds normal. He has no wheezes. He has no rales. He exhibits no tenderness.  Abdominal: Soft. He exhibits no distension. There is tenderness.  Mild diffuse abdominal tenderness to palpation. No focal tenderness.   Musculoskeletal: Normal range of motion.  Neurological: He is alert and oriented to person, place, and time. Coordination normal.  Speech is goal-oriented. Moves limbs without ataxia.   Skin: Skin  is warm and dry.  Psychiatric: He has a normal mood and affect. His behavior is normal.  Nursing note and vitals reviewed.   ED Course  Procedures (including critical care time) Labs Review Labs Reviewed  CBC - Abnormal; Notable for the following:    WBC 3.1 (*)    Platelets 149 (*)    All other components within normal limits  URINALYSIS, ROUTINE W REFLEX MICROSCOPIC (NOT AT [facility]) - Abnormal; Notable for the following:    Color, Urine ORANGE (*)    Specific Gravity, Urine 1.031 (*)    Bilirubin Urine MODERATE (*)    Ketones, ur 40 (*)    Leukocytes, UA TRACE (*)    All other components within normal limits  COMPREHENSIVE METABOLIC PANEL - Abnormal; Notable for the following:    Sodium 134 (*)    Chloride 100 (*)    Creatinine, Ser 1.59 (*)    Calcium 8.6 (*)    Total Protein 6.2 (*)    Albumin 3.4 (*)    AST 107 (*)    ALT 69 (*)    GFR calc non Af Amer 50 (*)    GFR calc Af Amer 58 (*)    All other components within normal limits  URINE MICROSCOPIC-ADD ON - Abnormal; Notable for the following:    Squamous Epithelial / LPF 0-5 (*)    Bacteria, UA RARE (*)    All other components within normal limits    Imaging Review No results found. I have personally reviewed and evaluated these images and lab results as part of my medical decision-making.   EKG Interpretation   Date/Time:  Wednesday May 23 2015 08:38:50 EST Ventricular Rate:  79 PR Interval:  161 QRS Duration: 95 QT Interval:  402 QTC Calculation: 461 R Axis:   -69 Text Interpretation:  Sinus rhythm Interpretation limited secondary to  artifact Right atrial enlargement Left anterior fascicular block Probable  anteroseptal infarct, old No old tracing to compare Confirmed by KOHUT   MD, STEPHEN ([id number]) on 05/23/2015 8:41:48 AM      MDM   Final diagnoses:  Viral gastroenteritis    8:51 AM Labs pending. Vitals stable and patient afebrile. No neuro deficits at this time. Patient likely has a  migraine and abdominal pain from a viral illness. Patient will have migraine cocktail and IV fluids.   11:53 AM Labs and urine show dehydration without other acute changes on infection. Patient feeling better after migraine cocktail. Patient likely has a viral illness and will be treated symptomatically. Vitals stable and patient afebrile. Patient instructed to drink fluids and rest. Patient will be discharged with zofran.     Emilia Beck, PA-C 05/23/15 1154  Raeford Razor, MD 05/26/15 2057

## 2015-05-23 NOTE — Discharge Instructions (Signed)
Take zofran as needed for nausea. Refer to attached documents for more information. Return to the ED with worsening or concerning symptoms.  °

## 2015-05-23 NOTE — ED Notes (Signed)
From home via [facility] for abd pain, V/D X3 days, VSS, CBG 103  4 mg Zofran
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
## 2015-09-06 ENCOUNTER — Emergency Department (HOSPITAL_COMMUNITY)
Admission: EM | Admit: 2015-09-06 | Discharge: 2015-09-06 | Disposition: A | Discharge status: Home / Self Care | Payer: Self-pay | Attending: Emergency Medicine | Admitting: Emergency Medicine

## 2015-09-06 ENCOUNTER — Emergency Department (HOSPITAL_COMMUNITY): Payer: Self-pay

## 2015-09-06 ENCOUNTER — Encounter (HOSPITAL_COMMUNITY): Payer: Self-pay | Admitting: Emergency Medicine

## 2015-09-06 DIAGNOSIS — Y9289 Other specified places as the place of occurrence of the external cause: Secondary | ICD-10-CM | POA: Insufficient documentation

## 2015-09-06 DIAGNOSIS — I252 Old myocardial infarction: Secondary | ICD-10-CM | POA: Insufficient documentation

## 2015-09-06 DIAGNOSIS — I2510 Atherosclerotic heart disease of native coronary artery without angina pectoris: Secondary | ICD-10-CM | POA: Insufficient documentation

## 2015-09-06 DIAGNOSIS — S92502A Displaced unspecified fracture of left lesser toe(s), initial encounter for closed fracture: Secondary | ICD-10-CM

## 2015-09-06 DIAGNOSIS — S92522A Displaced fracture of medial phalanx of left lesser toe(s), initial encounter for closed fracture: Secondary | ICD-10-CM | POA: Insufficient documentation

## 2015-09-06 DIAGNOSIS — F17210 Nicotine dependence, cigarettes, uncomplicated: Secondary | ICD-10-CM | POA: Insufficient documentation

## 2015-09-06 DIAGNOSIS — Y9389 Activity, other specified: Secondary | ICD-10-CM | POA: Insufficient documentation

## 2015-09-06 DIAGNOSIS — Y999 Unspecified external cause status: Secondary | ICD-10-CM | POA: Insufficient documentation

## 2015-09-06 MED ORDER — ACETAMINOPHEN 325 MG PO TABS
650.0000 mg | ORAL_TABLET | Freq: Once | ORAL | Status: AC
  Administered 2015-09-06: 650 mg via ORAL
  Filled 2015-09-06: qty 2

## 2015-09-06 MED ORDER — NAPROXEN 500 MG PO TABS: 500.0000 mg | ORAL_TABLET | Freq: Two times a day (BID) | ORAL | Status: DC
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
## 2015-09-06 NOTE — Discharge Instructions (Signed)
Toe Fracture °A toe fracture is a break in one of the toe bones (phalanges). °CAUSES °This condition may be caused by: °· Dropping a heavy object on your toe. °· Stubbing your toe. °· Overusing your toe or doing repetitive exercise. °· Twisting or stretching your toe out of place. °RISK FACTORS °This condition is more likely to develop in people who: °· Play contact sports. °· Have a bone disease. °· Have a low calcium level. °SYMPTOMS °The main symptoms of this condition are swelling and pain in the toe. The pain may get worse with standing or walking. Other symptoms include: °· Bruising. °· Stiffness. °· Numbness. °· A change in the way the toe looks. °· Broken bones that poke through the skin. °· Blood beneath the toenail. °DIAGNOSIS °This condition is diagnosed with a physical exam. You may also have X-rays. °TREATMENT  °Treatment for this condition depends on the type of fracture and its severity. Treatment may involve: °· Taping the broken toe to a toe that is next to it (buddy taping). This is the most common treatment for fractures in which the bone has not moved out of place (nondisplaced fracture). °· Wearing a shoe that has a wide, rigid sole to protect the toe and to limit its movement. °· Wearing a walking cast. °· Having a procedure to move the toe back into place. °· Surgery. This may be needed: °¨ If there are many pieces of broken bone that are out of place (displaced). °¨ If the toe joint breaks. °¨ If the bone breaks through the skin. °· Physical therapy. This is done to help regain movement and strength in the toe. °You may need follow-up X-rays to make sure that the bone is healing well and staying in position. °HOME CARE INSTRUCTIONS °If You Have a Cast: °· Do not stick anything inside the cast to scratch your skin. Doing that increases your risk of infection. °· Check the skin around the cast every day. Report any concerns to your health care provider. You may put lotion on dry skin around the  edges of the cast. Do not apply lotion to the skin underneath the cast. °· Do not put pressure on any part of the cast until it is fully hardened. This may take several hours. °· Keep the cast clean and dry. °Bathing °· Do not take baths, swim, or use a hot tub until your health care provider approves. Ask your health care provider if you can take showers. You may only be allowed to take sponge baths for bathing. °· If your health care provider approves bathing and showering, cover the cast or bandage (dressing) with a watertight plastic bag to protect it from water. Do not let the cast or dressing get wet. °Managing Pain, Stiffness, and Swelling °· If you do not have a cast, apply ice to the injured area, if directed. °¨ Put ice in a plastic bag. °¨ Place a towel between your skin and the bag. °¨ Leave the ice on for 20 minutes, 2-3 times per day. °· Move your toes often to avoid stiffness and to lessen swelling. °· Raise (elevate) the injured area above the level of your heart while you are sitting or lying down. °Driving °· Do not drive or operate heavy machinery while taking pain medicine. °· Do not drive while wearing a cast on a foot that you use for driving. °Activity °· Return to your normal activities as directed by your health care provider. Ask your health care   provider what activities are safe for you. °· Perform exercises daily as directed by your health care provider or physical therapist. °Safety °· Do not use the injured limb to support your body weight until your health care provider says that you can. Use crutches or other assistive devices as directed by your health care provider. °General Instructions °· If your toe was treated with buddy taping, follow your health care provider's instructions for changing the gauze and tape. Change it more often: °¨ The gauze and tape get wet. If this happens, dry the space between the toes. °¨ The gauze and tape are too tight and cause your toe to become pale  or numb. °· Wear a protective shoe as directed by your health care provider. If you were not given a protective shoe, wear sturdy, supportive shoes. Your shoes should not pinch your toes and should not fit tightly against your toes. °· Do not use any tobacco products, including cigarettes, chewing tobacco, or e-cigarettes. Tobacco can delay bone healing. If you need help quitting, ask your health care provider. °· Take medicines only as directed by your health care provider. °· Keep all follow-up visits as directed by your health care provider. This is important. °SEEK MEDICAL CARE IF: °· You have a fever. °· Your pain medicine is not helping. °· Your toe is cold. °· Your toe is numb. °· You still have pain after one week of rest and treatment. °· You still have pain after your health care provider has said that you can start walking again. °· You have pain, tingling, or numbness in your foot that is not going away. °SEEK IMMEDIATE MEDICAL CARE IF: °· You have severe pain. °· You have redness or inflammation in your toe that is getting worse. °· You have pain or numbness in your toe that is getting worse. °· Your toe turns blue. °  °This information is not intended to replace advice given to you by your health care provider. Make sure you discuss any questions you have with your health care provider. °  °Document Released: 06/06/2000 Document Revised: 02/28/2015 Document Reviewed: 04/05/2014 °Elsevier Interactive Patient Education ©2016 Elsevier Inc. ° °

## 2015-09-06 NOTE — ED Notes (Signed)
Declined W/C at D/C and was escorted to lobby by RN. 
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
## 2015-10-04 ENCOUNTER — Emergency Department (HOSPITAL_COMMUNITY)
Admission: EM | Admit: 2015-10-04 | Discharge: 2015-10-04 | Disposition: A | Discharge status: Home / Self Care | Payer: No Typology Code available for payment source | Attending: Emergency Medicine | Admitting: Emergency Medicine

## 2015-10-04 ENCOUNTER — Encounter (HOSPITAL_COMMUNITY): Payer: Self-pay

## 2015-10-04 ENCOUNTER — Emergency Department (HOSPITAL_COMMUNITY): Payer: No Typology Code available for payment source

## 2015-10-04 DIAGNOSIS — S92522P Displaced fracture of medial phalanx of left lesser toe(s), subsequent encounter for fracture with malunion: Secondary | ICD-10-CM | POA: Insufficient documentation

## 2015-10-04 DIAGNOSIS — Z791 Long term (current) use of non-steroidal anti-inflammatories (NSAID): Secondary | ICD-10-CM | POA: Diagnosis not present

## 2015-10-04 DIAGNOSIS — F17210 Nicotine dependence, cigarettes, uncomplicated: Secondary | ICD-10-CM | POA: Insufficient documentation

## 2015-10-04 DIAGNOSIS — Z79899 Other long term (current) drug therapy: Secondary | ICD-10-CM | POA: Diagnosis not present

## 2015-10-04 DIAGNOSIS — I252 Old myocardial infarction: Secondary | ICD-10-CM | POA: Insufficient documentation

## 2015-10-04 DIAGNOSIS — M79675 Pain in left toe(s): Secondary | ICD-10-CM | POA: Diagnosis present

## 2015-10-04 DIAGNOSIS — I2510 Atherosclerotic heart disease of native coronary artery without angina pectoris: Secondary | ICD-10-CM | POA: Insufficient documentation

## 2015-10-04 DIAGNOSIS — S92912P Unspecified fracture of left toe(s), subsequent encounter for fracture with malunion: Secondary | ICD-10-CM

## 2015-10-04 MED ORDER — OXYCODONE-ACETAMINOPHEN 5-325 MG PO TABS
1.0000 | ORAL_TABLET | Freq: Once | ORAL | Status: AC
  Administered 2015-10-04: 1 via ORAL
  Filled 2015-10-04: qty 1

## 2015-10-04 MED ORDER — NAPROXEN 500 MG PO TABS: 500.0000 mg | ORAL_TABLET | Freq: Two times a day (BID) | ORAL | Status: DC

## 2015-10-04 NOTE — Discharge Instructions (Signed)
Toe Fracture  A fracture is a break in the bone that can be either partial or complete. Fractures of the toe bones may or may not include the joints that separate the bones. SYMPTOMS   Severe pain over the fracture site at the time of injury that may persist for an extend period of time.  Pain, tenderness, inflammation, and/or bruising (contusion) over the fracture site.  Visible deformity, if the bone fragments are not properly aligned (displaced fracture).  Signs of vascular damage: numbness or coldness (uncommon). CAUSES  Toe fractures occur when a force is placed on the bone that is greater than it can withstand.  Direct hit (trauma) to the toe.  Indirect trauma to the toe, such as forcefully pivoting on a planted foot. RISK INCREASES WITH:  Performing activities barefoot (i.e. ballet, gymnastics).  Wearing shoes with little support or protection.  Sports with cleats (i.e. football, rugby, lacrosse, soccer).  Bone disease (i.e. osteoporosis, bone tumors). PREVENTION   Wear properly fitted and protective shoes.  Protect previously injured toes with tape or padding. PROGNOSIS  If treated properly, toe fractures usually heal within 4 to 6 weeks. RELATED COMPLICATIONS   Failure of the fracture to heal (nonunion).  Healing of the fracture in a poor position (malunion).  Recurring symptoms.  Recurring symptoms that result in a chronic problem.  Excessive bleeding, causing pressure on nerves and blood vessels (rare).  Arthritis of the affected joints.  Stopping of bone growth in children.  Infection in fractures where the skin is broken over the fracture (open fracture).  Shortening of injured bones. TREATMENT  Treatment first involves the use of ice and medicine to reduce pain and inflammation. The toe should be restrained for a period of time to allow for healing, usually about 4 weeks. Your caregiver may advise wearing a hard-soled shoe to minimize stress on the  healing bone. Surgery is uncommon for this injury, but may be necessary if the fracture is severely displaced or if the bone pushes through the skin. Surgery typically involves the use of screws, pins, and/or plates to hold the fracture in place. After surgery, restraint of the foot is necessary. MEDICATION   If pain medicine is necessary, nonsteroidal anti-inflammatory medications (aspirin and ibuprofen), or other minor pain relievers (acetaminophen), are often recommended.  Do not take pain medicine for 7 days before surgery.  Prescription pain relievers may be given if your caregiver thinks they are needed. Use only as directed and only as much as you need. COLD THERAPY  Cold treatment (icing) relieves pain and reduces inflammation. Cold treatment should be applied for 10 to 15 minutes every 2 to 3 hours, and immediately after activity that aggravates your symptoms. Use ice packs or an ice massage. SEEK MEDICAL CARE IF:   Treatment does not seem to help, or the condition gets worse.  Any medicines produce negative side effects.  Any complications from surgery occur:  Pain, numbness, or coldness in the affected foot.  Discoloration beneath the toenails (blue or gray) of the affected foot.  Signs of infection (fever, pain, inflammation, redness, or persistent bleeding).    This information is not intended to replace advice given to you by your health care provider. Make sure you discuss any questions you have with your health care provider.   Document Released: 06/09/2005 Document Revised: 10/24/2014 Document Reviewed: 09/21/2008 Elsevier Interactive Patient Education [employer]2016 Elsevier Inc.

## 2015-10-04 NOTE — ED Notes (Signed)
Returned from xray
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
## 2015-10-04 NOTE — ED Notes (Signed)
Into to room to check on pt pain relief s/p po medication.  Pt sleeping, did not awaken to my voice.
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
## 2015-10-04 NOTE — ED Notes (Signed)
Patient transported to X-ray via wheelchair 

## 2015-10-04 NOTE — ED Notes (Signed)
Pt has crutches at home and prefers to use them.  Shoe replaced.

## 2015-10-06 NOTE — ED Notes (Signed)
Pt admits to being "hard headed" and not following dr orders.  Has removed hard soled shoe and worn regular shoes.   Toe #2 left foot swollen with copious amounts of odiferous accumulation of dirt/dead skin between buddy taped toe #2 and #3.  Reports when he lived in [place] he once used crutches for 3 years due to broken foot that did not heal right due to his "hard headed" non-compliance.

## 2015-10-22 ENCOUNTER — Encounter: Payer: Self-pay | Admitting: Internal Medicine

## 2015-10-22 ENCOUNTER — Inpatient Hospital Stay: Payer: Self-pay | Admitting: Internal Medicine

## 2015-10-22 ENCOUNTER — Ambulatory Visit: Payer: Self-pay | Attending: Internal Medicine | Admitting: Internal Medicine

## 2015-10-22 VITALS — BP 119/71 | HR 57 | Temp 98.0°F | Resp 18 | Ht 74.0 in | Wt 157.0 lb

## 2015-10-22 DIAGNOSIS — M79675 Pain in left toe(s): Secondary | ICD-10-CM | POA: Insufficient documentation

## 2015-10-22 DIAGNOSIS — F4323 Adjustment disorder with mixed anxiety and depressed mood: Secondary | ICD-10-CM | POA: Insufficient documentation

## 2015-10-22 DIAGNOSIS — I252 Old myocardial infarction: Secondary | ICD-10-CM | POA: Insufficient documentation

## 2015-10-22 DIAGNOSIS — Z79899 Other long term (current) drug therapy: Secondary | ICD-10-CM | POA: Insufficient documentation

## 2015-10-22 DIAGNOSIS — I2510 Atherosclerotic heart disease of native coronary artery without angina pectoris: Secondary | ICD-10-CM | POA: Insufficient documentation

## 2015-10-22 DIAGNOSIS — F329 Major depressive disorder, single episode, unspecified: Secondary | ICD-10-CM

## 2015-10-22 DIAGNOSIS — S92592A Other fracture of left lesser toe(s), initial encounter for closed fracture: Secondary | ICD-10-CM

## 2015-10-22 DIAGNOSIS — Z560 Unemployment, unspecified: Secondary | ICD-10-CM

## 2015-10-22 DIAGNOSIS — S92502A Displaced unspecified fracture of left lesser toe(s), initial encounter for closed fracture: Secondary | ICD-10-CM

## 2015-10-22 DIAGNOSIS — F17210 Nicotine dependence, cigarettes, uncomplicated: Secondary | ICD-10-CM | POA: Insufficient documentation

## 2015-10-22 DIAGNOSIS — F32A Depression, unspecified: Secondary | ICD-10-CM

## 2015-10-22 MED ORDER — BLOOD PRESSURE CUFF MISC: 1.0000 "application " | Freq: Two times a day (BID) | Status: DC

## 2015-10-22 MED ORDER — CITALOPRAM HYDROBROMIDE 20 MG PO TABS: 20.0000 mg | ORAL_TABLET | Freq: Every day | ORAL | Status: DC

## 2015-10-22 MED ORDER — GABAPENTIN 300 MG PO CAPS: 300.0000 mg | ORAL_CAPSULE | Freq: Two times a day (BID) | ORAL | Status: DC

## 2015-10-22 NOTE — Addendum Note (Signed)
Addended byDierdre Searles: Farah Benish T on: 10/22/2015 05:49 PM   Modules accepted: Orders, Medications

## 2015-10-22 NOTE — Patient Instructions (Signed)
Generalized Anxiety Disorder Generalized anxiety disorder (GAD) is a mental disorder. It interferes with life functions, including relationships, work, and school. GAD is different from normal anxiety, which everyone experiences at some point in their lives in response to specific life events and activities. Normal anxiety actually helps [place] prepare for and get through these life events and activities. Normal anxiety goes away after the event or activity is over.  GAD causes anxiety that is not necessarily related to specific events or activities. It also causes excess anxiety in proportion to specific events or activities. The anxiety associated with GAD is also difficult to control. GAD can vary from mild to severe. People with severe GAD can have intense waves of anxiety with physical symptoms (panic attacks).  SYMPTOMS The anxiety and worry associated with GAD are difficult to control. This anxiety and worry are related to many life events and activities and also occur more days than not for 6 months or longer. People with GAD also have three or more of the following symptoms (one or more in children):  Restlessness.   Fatigue.  Difficulty concentrating.   Irritability.  Muscle tension.  Difficulty sleeping or unsatisfying sleep. DIAGNOSIS GAD is diagnosed through an assessment by your health care provider. Your health care provider will ask you questions aboutyour mood,physical symptoms, and events in your life. Your health care provider may ask you about your medical history and use of alcohol or drugs, including prescription medicines. Your health care provider may also do a physical exam and blood tests. Certain medical conditions and the use of certain substances can cause symptoms similar to those associated with GAD. Your health care provider may refer you to a mental health specialist for further evaluation. TREATMENT The following therapies are usually used to treat GAD:    Medication. Antidepressant medication usually is prescribed for long-term daily control. Antianxiety medicines may be added in severe cases, especially when panic attacks occur.   Talk therapy (psychotherapy). Certain types of talk therapy can be helpful in treating GAD by providing support, education, and guidance. A form of talk therapy called cognitive behavioral therapy can teach you healthy ways to think about and react to daily life events and activities.  Stress managementtechniques. These include yoga, meditation, and exercise and can be very helpful when they are practiced regularly. A mental health specialist can help determine which treatment is best for you. Some people see improvement with one therapy. However, other people require a combination of therapies.   This information is not intended to replace advice given to you by your health care provider. Make sure you discuss any questions you have with your health care provider.   Document Released: 10/04/2012 Document Revised: 06/30/2014 Document Reviewed: 10/04/2012  - Major Depressive Disorder Major depressive disorder is a mental illness. It also may be called clinical depression or unipolar depression. Major depressive disorder usually causes feelings of sadness, hopelessness, or helplessness. Some people with this disorder do not feel particularly sad but lose interest in doing things they used to enjoy (anhedonia). Major depressive disorder also can cause physical symptoms. It can interfere with work, school, relationships, and other normal everyday activities. The disorder varies in severity but is longer lasting and more serious than the sadness we all feel from time to time in our lives. Major depressive disorder often is triggered by stressful life events or major life changes. Examples of these triggers include divorce, loss of your job or home, a move, and the death of a  family member or close friend. Sometimes this  disorder occurs for no obvious reason at all. People who have family members with major depressive disorder or bipolar disorder are at higher risk for developing this disorder, with or without life stressors. Major depressive disorder can occur at any age. It may occur just once in your life (single episode major depressive disorder). It may occur multiple times (recurrent major depressive disorder). SYMPTOMS People with major depressive disorder have either anhedonia or depressed mood on nearly a daily basis for at least 2 weeks or longer. Symptoms of depressed mood include:  Feelings of sadness (blue or down in the dumps) or emptiness.  Feelings of hopelessness or helplessness.  Tearfulness or episodes of crying (may be observed by others).  Irritability (children and adolescents). In addition to depressed mood or anhedonia or both, people with this disorder have at least four of the following symptoms:  Difficulty sleeping or sleeping too much.   Significant change (increase or decrease) in appetite or weight.   Lack of energy or motivation.  Feelings of guilt and worthlessness.   Difficulty concentrating, remembering, or making decisions.  Unusually slow movement (psychomotor retardation) or restlessness (as observed by others).   Recurrent wishes for death, recurrent thoughts of self-harm (suicide), or a suicide attempt. People with major depressive disorder commonly have persistent negative thoughts about themselves, other people, and the world. People with severe major depressive disorder may experiencedistorted beliefs or perceptions about the world (psychotic delusions). They also may see or hear things that are not real (psychotic hallucinations). DIAGNOSIS Major depressive disorder is diagnosed through an assessment by your health care provider. Your health care provider will ask aboutaspects of your daily life, such as mood,sleep, and appetite, to see if you have the  diagnostic symptoms of major depressive disorder. Your health care provider may ask about your medical history and use of alcohol or drugs, including prescription medicines. Your health care provider also may do a physical exam and blood work. This is because certain medical conditions and the use of certain substances can cause major depressive disorder-like symptoms (secondary depression). Your health care provider also may refer you to a mental health specialist for further evaluation and treatment. TREATMENT It is important to recognize the symptoms of major depressive disorder and seek treatment. The following treatments can be prescribed for this disorder:   Medicine. Antidepressant medicines usually are prescribed. Antidepressant medicines are thought to correct chemical imbalances in the brain that are commonly associated with major depressive disorder. Other types of medicine may be added if the symptoms do not respond to antidepressant medicines alone or if psychotic delusions or hallucinations occur.  Talk therapy. Talk therapy can be helpful in treating major depressive disorder by providing support, education, and guidance. Certain types of talk therapy also can help with negative thinking (cognitive behavioral therapy) and with relationship issues that trigger this disorder (interpersonal therapy). A mental health specialist can help determine which treatment is best for you. Most people with major depressive disorder do well with a combination of medicine and talk therapy. Treatments involving electrical stimulation of the brain can be used in situations with extremely severe symptoms or when medicine and talk therapy do not work over time. These treatments include electroconvulsive therapy, transcranial magnetic stimulation, and vagal nerve stimulation.   This information is not intended to replace advice given to you by your health care provider. Make sure you discuss any questions you  have with your health care provider.  Document Released: 10/04/2012 Document Revised: 06/30/2014 Document Reviewed: 10/04/2012 Elsevier Interactive Patient Education 2016 [employer].  Risk analyst Patient Education [employer]

## 2015-10-22 NOTE — Progress Notes (Signed)
Patient is here for Toe Pain and Referral to Ortho  Patient complains of toe pain being present intermittently for the past month. Pain is described as burning scaled at a 6.  Patient has not taken any medications. Patient has eaten.
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
## 2015-11-05 ENCOUNTER — Ambulatory Visit: Payer: Self-pay | Attending: Internal Medicine | Admitting: Internal Medicine

## 2015-11-05 ENCOUNTER — Encounter: Payer: Self-pay | Admitting: Internal Medicine

## 2015-11-05 VITALS — BP 118/70 | HR 70 | Temp 97.7°F | Resp 18 | Ht 74.0 in | Wt 152.8 lb

## 2015-11-05 DIAGNOSIS — F329 Major depressive disorder, single episode, unspecified: Secondary | ICD-10-CM | POA: Insufficient documentation

## 2015-11-05 DIAGNOSIS — F32A Depression, unspecified: Secondary | ICD-10-CM

## 2015-11-05 DIAGNOSIS — I2510 Atherosclerotic heart disease of native coronary artery without angina pectoris: Secondary | ICD-10-CM | POA: Insufficient documentation

## 2015-11-05 DIAGNOSIS — F411 Generalized anxiety disorder: Secondary | ICD-10-CM | POA: Insufficient documentation

## 2015-11-05 DIAGNOSIS — Z79899 Other long term (current) drug therapy: Secondary | ICD-10-CM | POA: Insufficient documentation

## 2015-11-05 DIAGNOSIS — S92902P Unspecified fracture of left foot, subsequent encounter for fracture with malunion: Secondary | ICD-10-CM

## 2015-11-05 MED ORDER — CITALOPRAM HYDROBROMIDE 20 MG PO TABS: 20.0000 mg | ORAL_TABLET | Freq: Every day | ORAL | Status: DC

## 2015-11-05 MED FILL — ?CITALOPRAM HBR 20 MG TABLE: 20 | 30 days supply | Qty: 30 | Fill #0

## 2015-11-05 NOTE — Progress Notes (Signed)
Patient is here for FU 2 WEEK  Patient complains of left foot pain scaled currently at a 6.  Patient has not picked up medication from pharmacy. Patient has not eaten today.
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
## 2015-11-05 NOTE — Patient Instructions (Signed)
Generalized Anxiety Disorder Generalized anxiety disorder (GAD) is a mental disorder. It interferes with life functions, including relationships, work, and school. GAD is different from normal anxiety, which everyone experiences at some point in their lives in response to specific life events and activities. Normal anxiety actually helps us prepare for and get through these life events and activities. Normal anxiety goes away after the event or activity is over.  GAD causes anxiety that is not necessarily related to specific events or activities. It also causes excess anxiety in proportion to specific events or activities. The anxiety associated with GAD is also difficult to control. GAD can vary from mild to severe. People with severe GAD can have intense waves of anxiety with physical symptoms (panic attacks).  SYMPTOMS The anxiety and worry associated with GAD are difficult to control. This anxiety and worry are related to many life events and activities and also occur more days than not for 6 months or longer. People with GAD also have three or more of the following symptoms (one or more in children):  Restlessness.   Fatigue.  Difficulty concentrating.   Irritability.  Muscle tension.  Difficulty sleeping or unsatisfying sleep. DIAGNOSIS GAD is diagnosed through an assessment by your health care provider. Your health care provider will ask you questions aboutyour mood,physical symptoms, and events in your life. Your health care provider may ask you about your medical history and use of alcohol or drugs, including prescription medicines. Your health care provider may also do a physical exam and blood tests. Certain medical conditions and the use of certain substances can cause symptoms similar to those associated with GAD. Your health care provider may refer you to a mental health specialist for further evaluation. TREATMENT The following therapies are usually used to treat GAD:    Medication. Antidepressant medication usually is prescribed for long-term daily control. Antianxiety medicines may be added in severe cases, especially when panic attacks occur.   Talk therapy (psychotherapy). Certain types of talk therapy can be helpful in treating GAD by providing support, education, and guidance. A form of talk therapy called cognitive behavioral therapy can teach you healthy ways to think about and react to daily life events and activities.  Stress managementtechniques. These include yoga, meditation, and exercise and can be very helpful when they are practiced regularly. A mental health specialist can help determine which treatment is best for you. Some people see improvement with one therapy. However, other people require a combination of therapies.   This information is not intended to replace advice given to you by your health care provider. Make sure you discuss any questions you have with your health care provider.   Document Released: 10/04/2012 Document Revised: 06/30/2014 Document Reviewed: 10/04/2012 Elsevier Interactive Patient Education 2016 Elsevier Inc.  

## 2015-11-12 ENCOUNTER — Ambulatory Visit: Payer: Self-pay | Attending: Internal Medicine

## 2015-11-22 ENCOUNTER — Telehealth: Payer: Self-pay | Admitting: Internal Medicine

## 2015-11-22 NOTE — Telephone Encounter (Signed)
Pt. Called requesting a Rx for pain. He stated he broke his toe and he has pain. Please f/u

## 2015-11-22 NOTE — Telephone Encounter (Signed)
Will forward message to PCP.

## 2015-11-23 MED ORDER — IBUPROFEN 800 MG PO TABS: 800.0000 mg | ORAL_TABLET | Freq: Three times a day (TID) | ORAL | Status: DC | PRN

## 2015-11-23 MED FILL — IBUPROFEN 800 MG TABLET: 800 | 10 days supply | Qty: 30 | Fill #0

## 2015-11-23 NOTE — Telephone Encounter (Signed)
i put in rx for motrin 800mg .  Please call pt abt rx and to f/u W/ ortho:  Referral placed; Orthopedics Ph# [phone] address [address]. They will contact the patient for an appointment   - or he can call them to.  thanks

## 2015-11-27 NOTE — Telephone Encounter (Signed)
Clld pt - advsd of Rx Motrin 800 mg and status of referral to Orthopedics. Pt stated he understood. Pt advsd because of the numbness getting worse in his foot he fell last night. He thinks he may have broken a rib and may go the the ER for evaluation and treatment. Pt was advsd that he definitely should go for E&T.

## 2015-12-05 ENCOUNTER — Ambulatory Visit: Payer: Self-pay | Admitting: Internal Medicine

## 2016-03-19 ENCOUNTER — Ambulatory Visit: Payer: Self-pay | Attending: Internal Medicine | Admitting: *Deleted

## 2016-03-19 DIAGNOSIS — Z111 Encounter for screening for respiratory tuberculosis: Secondary | ICD-10-CM | POA: Insufficient documentation

## 2016-03-19 NOTE — Patient Instructions (Signed)
[facility] for you TB TEST. Please return in 48 hours to have you test read and resulted

## 2016-03-19 NOTE — Progress Notes (Signed)
Patient denies any allergies to rubbing alcohol or latex.  Patient is aware of instructions of TB site. Patient tolerated injection well today.

## 2016-03-21 ENCOUNTER — Ambulatory Visit: Payer: Self-pay | Attending: Internal Medicine

## 2016-03-21 DIAGNOSIS — Z111 Encounter for screening for respiratory tuberculosis: Secondary | ICD-10-CM

## 2016-03-21 LAB — TB SKIN TEST
Induration: 0 mm
TB Skin Test: NEGATIVE

## 2016-03-21 NOTE — Progress Notes (Signed)
Patient here for PPD reading only. 

## 2016-03-21 NOTE — Patient Instructions (Signed)
Letter generated

## 2016-03-28 ENCOUNTER — Telehealth: Payer: Self-pay

## 2016-03-28 NOTE — Telephone Encounter (Signed)
Contacted pt to inform him of his ppd patient didn't answer and was unable to lvm

## 2016-06-03 ENCOUNTER — Emergency Department (HOSPITAL_COMMUNITY)
Admission: EM | Admit: 2016-06-03 | Discharge: 2016-06-04 | Disposition: A | Discharge status: Home / Self Care | Payer: Self-pay

## 2016-06-03 ENCOUNTER — Encounter (HOSPITAL_COMMUNITY): Payer: Self-pay | Admitting: Emergency Medicine

## 2016-06-03 DIAGNOSIS — Z79899 Other long term (current) drug therapy: Secondary | ICD-10-CM | POA: Insufficient documentation

## 2016-06-03 DIAGNOSIS — I252 Old myocardial infarction: Secondary | ICD-10-CM | POA: Insufficient documentation

## 2016-06-03 DIAGNOSIS — F32A Depression, unspecified: Secondary | ICD-10-CM

## 2016-06-03 DIAGNOSIS — Z008 Encounter for other general examination: Secondary | ICD-10-CM

## 2016-06-03 DIAGNOSIS — F329 Major depressive disorder, single episode, unspecified: Secondary | ICD-10-CM | POA: Insufficient documentation

## 2016-06-03 DIAGNOSIS — F1010 Alcohol abuse, uncomplicated: Secondary | ICD-10-CM | POA: Insufficient documentation

## 2016-06-03 DIAGNOSIS — F17210 Nicotine dependence, cigarettes, uncomplicated: Secondary | ICD-10-CM | POA: Insufficient documentation

## 2016-06-03 DIAGNOSIS — F4325 Adjustment disorder with mixed disturbance of emotions and conduct: Secondary | ICD-10-CM | POA: Diagnosis present

## 2016-06-03 DIAGNOSIS — R45851 Suicidal ideations: Secondary | ICD-10-CM | POA: Insufficient documentation

## 2016-06-03 DIAGNOSIS — I2510 Atherosclerotic heart disease of native coronary artery without angina pectoris: Secondary | ICD-10-CM | POA: Insufficient documentation

## 2016-06-03 LAB — ETHANOL

## 2016-06-03 LAB — CBC
HCT: 38 % — ABNORMAL LOW (ref 39.0–52.0)
Hemoglobin: 13.4 g/dL (ref 13.0–17.0)
MCH: 31.1 pg (ref 26.0–34.0)
MCHC: 35.3 g/dL (ref 30.0–36.0)
MCV: 88.2 fL (ref 78.0–100.0)
PLATELETS: 152 10*3/uL (ref 150–400)
RBC: 4.31 MIL/uL (ref 4.22–5.81)
RDW: 15.4 % (ref 11.5–15.5)
WBC: 4.7 10*3/uL (ref 4.0–10.5)

## 2016-06-03 LAB — COMPREHENSIVE METABOLIC PANEL
ALT: 29 U/L (ref 17–63)
ANION GAP: 10 (ref 5–15)
AST: 41 U/L (ref 15–41)
Albumin: 4.5 g/dL (ref 3.5–5.0)
Alkaline Phosphatase: 67 U/L (ref 38–126)
BUN: 16 mg/dL (ref 6–20)
CALCIUM: 9.6 mg/dL (ref 8.9–10.3)
CHLORIDE: 101 mmol/L (ref 101–111)
CO2: 26 mmol/L (ref 22–32)
CREATININE: 1.34 mg/dL — AB (ref 0.61–1.24)
Glucose, Bld: 102 mg/dL — ABNORMAL HIGH (ref 65–99)
Potassium: 3.6 mmol/L (ref 3.5–5.1)
SODIUM: 137 mmol/L (ref 135–145)
Total Bilirubin: 1.1 mg/dL (ref 0.3–1.2)
Total Protein: 7.8 g/dL (ref 6.5–8.1)

## 2016-06-03 LAB — RAPID URINE DRUG SCREEN, HOSP PERFORMED
Amphetamines: NOT DETECTED
Barbiturates: NOT DETECTED
Benzodiazepines: NOT DETECTED
COCAINE: NOT DETECTED
OPIATES: NOT DETECTED
Tetrahydrocannabinol: POSITIVE — AB

## 2016-06-03 LAB — SALICYLATE LEVEL

## 2016-06-03 LAB — ACETAMINOPHEN LEVEL

## 2016-06-03 MED ORDER — LORAZEPAM 1 MG PO TABS
1.0000 mg | ORAL_TABLET | Freq: Three times a day (TID) | ORAL | Status: DC | PRN
Start: 2016-06-03 — End: 2016-06-04

## 2016-06-03 MED ORDER — ONDANSETRON HCL 4 MG PO TABS: 4.0000 mg | ORAL_TABLET | Freq: Three times a day (TID) | ORAL | Status: DC | PRN

## 2016-06-03 MED ORDER — ZOLPIDEM TARTRATE 5 MG PO TABS: 5.0000 mg | ORAL_TABLET | Freq: Every evening | ORAL | Status: DC | PRN

## 2016-06-03 NOTE — ED Notes (Signed)
TTS in with pt. 
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
## 2016-06-03 NOTE — ED Provider Notes (Signed)
[facility]-EMERGENCY DEPT Provider Note   CSN: [id number] Arrival date & time: 06/03/16  1823     History   Chief Complaint Chief Complaint  Patient presents with  . Medical Clearance    SI hI     HPI Dean Howard is a 50 y.o. male.  HPI Pt comes in with cc of SI. [facility]. Pt reports hx of CAD. He states that he has hx of depression he thinks, never been on any meds for it. He reports that he gets angry easily and is hostile/agressive towards his family members and even strangers. Pt is not sure why he has that problem, but thinks it's because of depression. He reports that he also gets down sometimes and has thoughts of hurting himself,although he has no active thoughts or plans. Pt denies nausea, emesis, fevers, chills, chest pains, shortness of breath, headaches, abdominal pain, uti like symptoms.  Pt reports daily heavy drinking. Last drinking was yday   Past Medical History:  Diagnosis Date  . Coronary artery disease   . MI, old     Patient Active Problem List   Diagnosis Date Noted  . 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 CHEST PAIN, PLEURITIC 09/16/2007    Past Surgical History:  Procedure Laterality Date  . KNEE SURGERY         Home Medications    Prior to Admission medications   Medication Sig Start Date End Date Taking? Authorizing Provider  cephALEXin (KEFLEX) 500 MG capsule Take 1 capsule (500 mg total) by mouth 4 (four) times daily. Patient not taking: Reported on 06/03/2016 01/28/14   Shon Batonourtney F Horton, MD  citalopram (CELEXA) 20 MG tablet Take 1 tablet (20 mg total) by mouth daily. Patient not taking: Reported on 06/03/2016 11/05/15   Pete Glatterawn T Langeland, MD  gabapentin (NEURONTIN) 300 MG capsule Take 1 capsule (300 mg total) by mouth 2 (two) times daily. Patient not taking: Reported on 06/03/2016 10/22/15   Pete Glatterawn T Langeland, MD  hydrocortisone cream 1 % Apply 1 application topically as needed for itching.  Reported on 11/05/2015    Historical Provider, MD  ibuprofen (ADVIL,MOTRIN) 800 MG tablet Take 1 tablet (800 mg total) by mouth every 8 (eight) hours as needed (take with food). Patient not taking: Reported on 06/03/2016 11/23/15   Pete Glatterawn T Langeland, MD  naproxen (NAPROSYN) 500 MG tablet Take 1 tablet (500 mg total) by mouth 2 (two) times daily. Patient not taking: Reported on 06/03/2016 10/04/15   Felicie Mornavid Smith, NP  naproxen (NAPROSYN) 500 MG tablet Take 1 tablet (500 mg total) by mouth 2 (two) times daily. Patient not taking: Reported on 06/03/2016 10/04/15   Felicie Mornavid Smith, NP  ondansetron (ZOFRAN) 4 MG tablet Take 1 tablet (4 mg total) by mouth every 8 (eight) hours as needed for nausea or vomiting. Patient not taking: Reported on 06/03/2016 05/23/15   Emilia BeckKaitlyn Szekalski, PA-C  oxyCODONE-acetaminophen (PERCOCET/ROXICET) 5-325 MG per tablet Take 1 tablet by mouth every 6 (six) hours as needed for severe pain. Patient not taking: Reported on 06/03/2016 01/28/14   Shon Batonourtney F Horton, MD    Family History No family history on file.  Social History Social History  Substance Use Topics  . Smoking status: Current Every Day Smoker    Packs/day: 0.25    Types: Cigarettes  . Smokeless tobacco: Never Used  . Alcohol use Yes     Allergies   Patient has no known allergies.   Review  of Systems Review of Systems  ROS 10 Systems reviewed and are negative for acute change except as noted in the HPI.     Physical Exam Updated Vital Signs BP 140/83   Pulse (!) 58   Temp 98.2 F (36.8 C) (Oral)   Resp 14   SpO2 99%   Physical Exam  Constitutional: He is oriented to person, place, and time. He appears well-developed.  HENT:  Head: Atraumatic.  Eyes: EOM are normal.  Neck: Neck supple.  Cardiovascular: Normal rate and regular rhythm.   Pulmonary/Chest: Effort normal.  Abdominal: Soft. There is no tenderness.  Neurological: He is alert and oriented to person, place, and time.  Skin: Skin is  warm.  Psychiatric: He has a normal mood and affect. His behavior is normal. Judgment and thought content normal.  Nursing note and vitals reviewed.    ED Treatments / Results  Labs (all labs ordered are listed, but only abnormal results are displayed) Labs Reviewed  COMPREHENSIVE METABOLIC PANEL - Abnormal; Notable for the following:       Result Value   Glucose, Bld 102 (*)    Creatinine, Ser 1.34 (*)    All other components within normal limits  ACETAMINOPHEN LEVEL - Abnormal; Notable for the following:    Acetaminophen (Tylenol), Serum <10 (*)    All other components within normal limits  CBC - Abnormal; Notable for the following:    HCT 38.0 (*)    All other components within normal limits  RAPID URINE DRUG SCREEN, HOSP PERFORMED - Abnormal; Notable for the following:    Tetrahydrocannabinol POSITIVE (*)    All other components within normal limits  ETHANOL  SALICYLATE LEVEL    EKG  EKG Interpretation None       Radiology No results found.  Procedures Procedures (including critical care time)  Medications Ordered in ED Medications  LORazepam (ATIVAN) tablet 1 mg (not administered)  zolpidem (AMBIEN) tablet 5 mg (not administered)  ondansetron (ZOFRAN) tablet 4 mg (not administered)     Initial Impression / Assessment and Plan / ED Course  I have reviewed the triage vital signs and the nursing notes.  Pertinent labs & imaging results that were available during my care of the patient were reviewed by me and considered in my medical decision making (see chart for details).  Clinical Course     Pr comes in with SI and depression like symptoms. Also having some aggressive behavior towards others and has underlying behavioral/mood disorder. Medically cleared.  Pt has alcoholism, we will activate CIWA.  Final Clinical Impressions(s) / ED Diagnoses   Final diagnoses:  Medical clearance for psychiatric admission  Suicidal ideation  Alcohol abuse    Depression, unspecified depression type    New Prescriptions New Prescriptions   No medications on file     Derwood KaplanAnkit Lisette Mancebo, MD 06/03/16 2041

## 2016-06-03 NOTE — ED Triage Notes (Signed)
[facility] . IVC paper reporting pt is SI and HI without plan. Positive for marijuana use. Pt reports hallucinations.

## 2016-06-03 NOTE — ED Notes (Signed)
Bed: WA29 Expected date:  Expected time:  Means of arrival:  Comments: [facility]/SI uses CPAP at night

## 2016-06-03 NOTE — ED Notes (Signed)
Safety check performed for contraband none found. 

## 2016-06-04 DIAGNOSIS — Z79899 Other long term (current) drug therapy: Secondary | ICD-10-CM

## 2016-06-04 DIAGNOSIS — Z9889 Other specified postprocedural states: Secondary | ICD-10-CM

## 2016-06-04 DIAGNOSIS — F4325 Adjustment disorder with mixed disturbance of emotions and conduct: Secondary | ICD-10-CM

## 2016-06-04 DIAGNOSIS — F17210 Nicotine dependence, cigarettes, uncomplicated: Secondary | ICD-10-CM

## 2016-06-04 MED ORDER — CARBAMAZEPINE ER 200 MG PO TB12
200.0000 mg | ORAL_TABLET | Freq: Two times a day (BID) | ORAL | Status: DC
  Administered 2016-06-04: 200 mg via ORAL
  Filled 2016-06-04: qty 1

## 2016-06-04 MED ORDER — CARBAMAZEPINE ER 200 MG PO TB12: 200.0000 mg | ORAL_TABLET | Freq: Two times a day (BID) | ORAL | 0 refills | Status: DC

## 2016-06-04 MED ORDER — IBUPROFEN 800 MG PO TABS
800.0000 mg | ORAL_TABLET | Freq: Once | ORAL | Status: AC
  Administered 2016-06-04: 800 mg via ORAL
  Filled 2016-06-04: qty 1

## 2016-06-04 MED ORDER — ACETAMINOPHEN 325 MG PO TABS
650.0000 mg | ORAL_TABLET | Freq: Once | ORAL | Status: AC
  Administered 2016-06-04: 650 mg via ORAL
  Filled 2016-06-04: qty 2

## 2016-06-04 NOTE — ED Notes (Signed)
Patient admits to SI no plan and denies HI and AVH at this time. Plan of care discussed with patient. Patient voices no complaints or concerns at this time. Encouragement and support provided and safety main. Q 15 min safety checks remain in place and video monitoring.

## 2016-06-04 NOTE — ED Notes (Signed)
Pt d/c home per MD order. This nurse reviewed discharge summary with pt. Pt verbalizes understanding of discharge summary and outpatient resources. RX provided. Pt denies SI/HI. Denies AVH at discharge. Pt signed for personal property and property returned. Pt given bus pass per his request. Pt signed e-signature. Ambulatory off unit with [facility].

## 2016-06-04 NOTE — BH Assessment (Signed)
[facility] Assessment Progress Note  Per Thedore MinsMojeed Akintayo, MD, this pt does not require psychiatric hospitalization at this time.  [facility].  This has been included in pt's discharge instructions.  Pt's nurse, Morrie Sheldonshley, has been notified.  Doylene Canninghomas Sanika Brosious, MA Triage Specialist [phone]

## 2016-06-04 NOTE — B[facility] Suicide Risk Assessment (Signed)
Suicide Risk Assessment  Discharge Assessment   [facility]   Principal Problem: Adjustment disorder with mixed disturbance of emotions and conduct Discharge Diagnoses:  Patient Active Problem List   Diagnosis Date Noted  . Adjustment disorder with mixed disturbance of emotions and conduct [F43.25] 06/04/2016    Priority: High  . 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 CHEST PAIN, PLEURITIC [R07.1] 09/16/2007    Total Time spent with patient: 45 minutes  Musculoskeletal: Strength & Muscle Tone: within normal limits Gait & Station: normal Patient leans: N/A  Psychiatric Specialty Exam: Physical Exam  Constitutional: He is oriented to person, place, and time. He appears well-developed and well-nourished.  HENT:  Head: Normocephalic.  Neck: Normal range of motion.  Respiratory: Effort normal.  Musculoskeletal: Normal range of motion.  Neurological: He is alert and oriented to person, place, and time.  Skin: Skin is warm and dry.  Psychiatric: He has a normal mood and affect. His speech is normal and behavior is normal. Judgment and thought content normal. Cognition and memory are normal.    Review of Systems  Psychiatric/Behavioral: Positive for substance abuse.  All other systems reviewed and are negative.   Blood pressure 116/70, pulse 88, temperature 98.1 F (36.7 C), resp. rate 18, SpO2 94 %.There is no height or weight on file to calculate BMI.  General Appearance: Casual  Eye Contact:  Good  Speech:  Normal Rate  Volume:  Normal  Mood:  Euthymic  Affect:  Appropriate and Congruent  Thought Process:  Coherent and Descriptions of Associations: Intact  Orientation:  Full (Time, Place, and Person)  Thought Content:  WDL  Suicidal Thoughts:  No  Homicidal Thoughts:  No  Memory:  Immediate;   Good Recent;   Good Remote;   Good  Judgement:  Fair  Insight:  Fair  Psychomotor Activity:  Normal  Concentration:   Concentration: Good and Attention Span: Good  Recall:  Good  Fund of Knowledge:  Fair  Language:  Good  Akathisia:  No  Handed:  Right  AIMS (if indicated):     Assets:  Housing Leisure Time Physical Health Resilience Social Support  ADL's:  Intact  Cognition:  WNL  Sleep:       Mental Status Per Nursing Assessment::   On Admission:   suicidal/homicidal ideations  Demographic Factors:  Male  Loss Factors: NA  Historical Factors: NA  Risk Reduction Factors:   Sense of responsibility to family, Living with another person, especially a relative and Positive social support  Continued Clinical Symptoms:  None  Cognitive Features That Contribute To Risk:  None    Suicide Risk:  Minimal: No identifiable suicidal ideation.  Patients presenting with no risk factors but with morbid ruminations; may be classified as minimal risk based on the severity of the depressive symptoms    Plan Of Care/Follow-up recommendations:  Activity:  as tolerated Diet:  heart healthy diet  Baraka Klatt, NP 06/04/2016, 10:41 AM

## 2016-06-04 NOTE — ED Notes (Signed)
Pt behavior cooperative,elated, pleasant on approach. Pt denies SI/HI. Pt denies AVH. Special checks q 15 mins in place for safety. Video monitoring in place.

## 2016-06-04 NOTE — Consult Note (Signed)
[name] Psychiatry Consult   Reason for Consult:  Suicidal and homicidal ideations Referring Physician:  EDP Patient Identification: Dean Howard MRN:  [id number] Principal Diagnosis: Adjustment disorder with mixed disturbance of emotions and conduct Diagnosis:   Patient Active Problem List   Diagnosis Date Noted  . Adjustment disorder with mixed disturbance of emotions and conduct [F43.25] 06/04/2016    Priority: High  . 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 CHEST PAIN, PLEURITIC [R07.1] 09/16/2007    Total Time spent with patient: 45 minutes  Subjective:   Dean Howard is a 50 y.o. male patient does not warrant admissoin.  HPI:  50 yo male presented to the ED from [facility] with suicidal/homicidal ideations but denied these here.  Today, he is calm and cooperative.  He reports he got upset at home and his mother told him he needed something to help his mood/anger.  Denies suicidal/homicidal ideations, hallucinations.  Uses cannabis frequently and alcohol but negative for alcohol.  Agreeable to start medications and go to outpatient services [facility].  Past Psychiatric History: substance abuse  Risk to Self: Suicidal Ideation: No Suicidal Intent: No Is patient at risk for suicide?: No Suicidal Plan?: No Access to Means: No What has been your use of drugs/alcohol within the last 12 months?: reports to daily alcohol and marijuana use How many times?: 1 Triggers for Past Attempts: Other (Comment) (father passed away, conflict with significant others) Intentional Self Injurious Behavior: None Risk to Others:None Prior Inpatient Therapy: Prior Inpatient Therapy: No Prior Outpatient Therapy: Prior Outpatient Therapy: Yes Prior Therapy Dates: current Prior Therapy Facilty/Provider(s): [facility] Reason for Treatment: Med management, anger Does patient have an ACCT team?: No Does patient have Intensive In-House Services?  : No Does  patient have [facility] services? : Yes Does patient have P4CC services?: No  Past Medical History:  Past Medical History:  Diagnosis Date  . Coronary artery disease   . MI, old     Past Surgical History:  Procedure Laterality Date  . KNEE SURGERY     Family History: No family history on file. Family Psychiatric  History: none Social History:  History  Alcohol Use  . Yes     History  Drug Use  . Types: Marijuana    Social History   Social History  . Marital status: Single    Spouse name: N/A  . Number of children: N/A  . Years of education: N/A   Social History Main Topics  . Smoking status: Current Every Day Smoker    Packs/day: 0.25    Types: Cigarettes  . Smokeless tobacco: Never Used  . Alcohol use Yes  . Drug use:     Types: Marijuana  . Sexual activity: Not Asked   Other Topics Concern  . None   Social History Narrative  . None   Additional Social History:    Allergies:  No Known Allergies  Labs:  Results for orders placed or performed during the hospital encounter of 06/03/16 (from the past 48 hour(s))  Rapid urine drug screen (hospital performed)     Status: Abnormal   Collection Time: 06/03/16  6:40 PM  Result Value Ref Range   Opiates NONE DETECTED NONE DETECTED   Cocaine NONE DETECTED NONE DETECTED   Benzodiazepines NONE DETECTED NONE DETECTED   Amphetamines NONE DETECTED NONE DETECTED   Tetrahydrocannabinol POSITIVE (A) NONE DETECTED   Barbiturates NONE DETECTED NONE DETECTED    Comment:  DRUG SCREEN FOR MEDICAL PURPOSES ONLY.  IF CONFIRMATION IS NEEDED FOR ANY PURPOSE, NOTIFY LAB WITHIN 5 DAYS.        LOWEST DETECTABLE LIMITS FOR URINE DRUG SCREEN Drug Class       Cutoff (ng/mL) Amphetamine      1000 Barbiturate      200 Benzodiazepine   778 Tricyclics       242 Opiates          300 Cocaine          300 THC              50   Comprehensive metabolic panel     Status: Abnormal   Collection Time: 06/03/16  6:50 PM  Result  Value Ref Range   Sodium 137 135 - 145 mmol/L   Potassium 3.6 3.5 - 5.1 mmol/L   Chloride 101 101 - 111 mmol/L   CO2 26 22 - 32 mmol/L   Glucose, Bld 102 (H) 65 - 99 mg/dL   BUN 16 6 - 20 mg/dL   Creatinine, Ser 1.34 (H) 0.61 - 1.24 mg/dL   Calcium 9.6 8.9 - 10.3 mg/dL   Total Protein 7.8 6.5 - 8.1 g/dL   Albumin 4.5 3.5 - 5.0 g/dL   AST 41 15 - 41 U/L   ALT 29 17 - 63 U/L   Alkaline Phosphatase 67 38 - 126 U/L   Total Bilirubin 1.1 0.3 - 1.2 mg/dL   GFR calc non Af Amer >60 >60 mL/min   GFR calc Af Amer >60 >60 mL/min    Comment: (NOTE) The eGFR has been calculated using the CKD EPI equation. This calculation has not been validated in all clinical situations. eGFR's persistently <60 mL/min signify possible Chronic Kidney Disease.    Anion gap 10 5 - 15  Ethanol     Status: None   Collection Time: 06/03/16  6:50 PM  Result Value Ref Range   Alcohol, Ethyl (B) <5 <5 mg/dL    Comment:        LOWEST DETECTABLE LIMIT FOR SERUM ALCOHOL IS 5 mg/dL FOR MEDICAL PURPOSES ONLY   Salicylate level     Status: None   Collection Time: 06/03/16  6:50 PM  Result Value Ref Range   Salicylate Lvl <3.5 2.8 - 30.0 mg/dL  Acetaminophen level     Status: Abnormal   Collection Time: 06/03/16  6:50 PM  Result Value Ref Range   Acetaminophen (Tylenol), Serum <10 (L) 10 - 30 ug/mL    Comment:        THERAPEUTIC CONCENTRATIONS VARY SIGNIFICANTLY. A RANGE OF 10-30 ug/mL MAY BE AN EFFECTIVE CONCENTRATION FOR MANY PATIENTS. HOWEVER, SOME ARE BEST TREATED AT CONCENTRATIONS OUTSIDE THIS RANGE. ACETAMINOPHEN CONCENTRATIONS >150 ug/mL AT 4 HOURS AFTER INGESTION AND >50 ug/mL AT 12 HOURS AFTER INGESTION ARE OFTEN ASSOCIATED WITH TOXIC REACTIONS.   cbc     Status: Abnormal   Collection Time: 06/03/16  6:50 PM  Result Value Ref Range   WBC 4.7 4.0 - 10.5 K/uL   RBC 4.31 4.22 - 5.81 MIL/uL   Hemoglobin 13.4 13.0 - 17.0 g/dL   HCT 38.0 (L) 39.0 - 52.0 %   MCV 88.2 78.0 - 100.0 fL   MCH 31.1  26.0 - 34.0 pg   MCHC 35.3 30.0 - 36.0 g/dL   RDW 15.4 11.5 - 15.5 %   Platelets 152 150 - 400 K/uL    Current Facility-Administered Medications  Medication Dose Route Frequency Provider Last  Rate Last Dose  . carbamazepine (TEGRETOL XR) 12 hr tablet 200 mg  200 mg Oral BID Patrecia Pour, NP      . ibuprofen (ADVIL,MOTRIN) tablet 800 mg  800 mg Oral Once Patrecia Pour, NP       Current Outpatient Prescriptions  Medication Sig Dispense Refill  . carbamazepine (TEGRETOL XR) 200 MG 12 hr tablet Take 1 tablet (200 mg total) by mouth 2 (two) times daily. 60 tablet 0  . cephALEXin (KEFLEX) 500 MG capsule Take 1 capsule (500 mg total) by mouth 4 (four) times daily. (Patient not taking: Reported on 06/03/2016) 28 capsule 0  . citalopram (CELEXA) 20 MG tablet Take 1 tablet (20 mg total) by mouth daily. (Patient not taking: Reported on 06/03/2016) 30 tablet 3  . gabapentin (NEURONTIN) 300 MG capsule Take 1 capsule (300 mg total) by mouth 2 (two) times daily. (Patient not taking: Reported on 06/03/2016) 60 capsule 3  . hydrocortisone cream 1 % Apply 1 application topically as needed for itching. Reported on 11/05/2015    . ibuprofen (ADVIL,MOTRIN) 800 MG tablet Take 1 tablet (800 mg total) by mouth every 8 (eight) hours as needed (take with food). (Patient not taking: Reported on 06/03/2016) 30 tablet 0  . naproxen (NAPROSYN) 500 MG tablet Take 1 tablet (500 mg total) by mouth 2 (two) times daily. (Patient not taking: Reported on 06/03/2016) 20 tablet 0  . naproxen (NAPROSYN) 500 MG tablet Take 1 tablet (500 mg total) by mouth 2 (two) times daily. (Patient not taking: Reported on 06/03/2016) 20 tablet 0  . ondansetron (ZOFRAN) 4 MG tablet Take 1 tablet (4 mg total) by mouth every 8 (eight) hours as needed for nausea or vomiting. (Patient not taking: Reported on 06/03/2016) 10 tablet 0  . oxyCODONE-acetaminophen (PERCOCET/ROXICET) 5-325 MG per tablet Take 1 tablet by mouth every 6 (six) hours as needed  for severe pain. (Patient not taking: Reported on 06/03/2016) 15 tablet 0    Musculoskeletal: Strength & Muscle Tone: within normal limits Gait & Station: normal Patient leans: N/A  Psychiatric Specialty Exam: Physical Exam  Constitutional: He is oriented to person, place, and time. He appears well-developed and well-nourished.  HENT:  Head: Normocephalic.  Neck: Normal range of motion.  Respiratory: Effort normal.  Musculoskeletal: Normal range of motion.  Neurological: He is alert and oriented to person, place, and time.  Skin: Skin is warm and dry.  Psychiatric: He has a normal mood and affect. His speech is normal and behavior is normal. Judgment and thought content normal. Cognition and memory are normal.    Review of Systems  Psychiatric/Behavioral: Positive for substance abuse.  All other systems reviewed and are negative.   Blood pressure 116/70, pulse 88, temperature 98.1 F (36.7 C), resp. rate 18, SpO2 94 %.There is no height or weight on file to calculate BMI.  General Appearance: Casual  Eye Contact:  Good  Speech:  Normal Rate  Volume:  Normal  Mood:  Euthymic  Affect:  Appropriate and Congruent  Thought Process:  Coherent and Descriptions of Associations: Intact  Orientation:  Full (Time, Place, and Person)  Thought Content:  WDL  Suicidal Thoughts:  No  Homicidal Thoughts:  No  Memory:  Immediate;   Good Recent;   Good Remote;   Good  Judgement:  Fair  Insight:  Fair  Psychomotor Activity:  Normal  Concentration:  Concentration: Good and Attention Span: Good  Recall:  Good  Fund of Knowledge:  Fair  Language:  Good  Akathisia:  No  Handed:  Right  AIMS (if indicated):     Assets:  Housing Leisure Time Physical Health Resilience Social Support  ADL's:  Intact  Cognition:  WNL  Sleep:        Treatment Plan Summary: Daily contact with patient to assess and evaluate symptoms and progress in treatment, Medication management and Plan adjustment  disorder with mixed disturbance of emotions and conduct:  -Crisis stabilization -Medication management:  Tegretol 200 mg BID started for mood/anger -Individual counseling -Rx provided  Disposition: No evidence of imminent risk to self or others at present.    Waylan Boga, NP 06/04/2016 10:34 AM  Patient seen face-to-face for psychiatric evaluation, chart reviewed and case discussed with the physician extender and developed treatment plan. Reviewed the information documented and agree with the treatment plan. Corena Pilgrim, MD

## 2016-06-04 NOTE — Discharge Instructions (Signed)
For your ongoing mental health needs, you are advised to follow up with [facility].  If you do not currently have an appointment, new and returning patients are seen at their walk-in clinic.  Walk-in hours are Monday - Friday from 8:00 am - 3:00 pm.  Walk-in patients are seen on a first come, first served basis.  Try to arrive as early as possible for he best chance of being seen the same day: ° °     [facility] °     [address] °     [place], [place] [address] °     [phone] °

## 2016-06-06 MED FILL — CARBAMAZEPINE ER 200 MG TAB: 200 | 30 days supply | Qty: 60 | Fill #0

## 2016-07-17 ENCOUNTER — Emergency Department (HOSPITAL_COMMUNITY): Payer: Self-pay

## 2016-07-17 ENCOUNTER — Encounter (HOSPITAL_COMMUNITY): Payer: Self-pay

## 2016-07-17 ENCOUNTER — Emergency Department (HOSPITAL_COMMUNITY)
Admission: EM | Admit: 2016-07-17 | Discharge: 2016-07-20 | Disposition: A | Discharge status: Other Acute Inpatient Hospital | Payer: Self-pay | Attending: Emergency Medicine | Admitting: Emergency Medicine

## 2016-07-17 DIAGNOSIS — R45851 Suicidal ideations: Secondary | ICD-10-CM | POA: Insufficient documentation

## 2016-07-17 DIAGNOSIS — F17210 Nicotine dependence, cigarettes, uncomplicated: Secondary | ICD-10-CM | POA: Insufficient documentation

## 2016-07-17 DIAGNOSIS — R945 Abnormal results of liver function studies: Secondary | ICD-10-CM | POA: Insufficient documentation

## 2016-07-17 DIAGNOSIS — Z79899 Other long term (current) drug therapy: Secondary | ICD-10-CM | POA: Insufficient documentation

## 2016-07-17 DIAGNOSIS — I252 Old myocardial infarction: Secondary | ICD-10-CM | POA: Insufficient documentation

## 2016-07-17 DIAGNOSIS — F333 Major depressive disorder, recurrent, severe with psychotic symptoms: Secondary | ICD-10-CM | POA: Diagnosis present

## 2016-07-17 DIAGNOSIS — R7989 Other specified abnormal findings of blood chemistry: Secondary | ICD-10-CM

## 2016-07-17 HISTORY — DX: Other specified arthropod-borne viral fevers: A93.8

## 2016-07-17 LAB — COMPREHENSIVE METABOLIC PANEL
ALBUMIN: 3.5 g/dL (ref 3.5–5.0)
ALK PHOS: 251 U/L — AB (ref 38–126)
ALT: 105 U/L — ABNORMAL HIGH (ref 17–63)
ANION GAP: 12 (ref 5–15)
AST: 114 U/L — ABNORMAL HIGH (ref 15–41)
BUN: 6 mg/dL (ref 6–20)
CALCIUM: 9 mg/dL (ref 8.9–10.3)
CO2: 24 mmol/L (ref 22–32)
Chloride: 99 mmol/L — ABNORMAL LOW (ref 101–111)
Creatinine, Ser: 1.53 mg/dL — ABNORMAL HIGH (ref 0.61–1.24)
GFR calc non Af Amer: 52 mL/min — ABNORMAL LOW (ref >60)
GFR, EST AFRICAN AMERICAN: 60 mL/min — AB (ref >60)
Glucose, Bld: 78 mg/dL (ref 65–99)
POTASSIUM: 3.3 mmol/L — AB (ref 3.5–5.1)
SODIUM: 135 mmol/L (ref 135–145)
Total Bilirubin: 0.5 mg/dL (ref 0.3–1.2)
Total Protein: 7 g/dL (ref 6.5–8.1)

## 2016-07-17 LAB — RAPID URINE DRUG SCREEN, HOSP PERFORMED
Amphetamines: NOT DETECTED
BENZODIAZEPINES: NOT DETECTED
Barbiturates: NOT DETECTED
COCAINE: NOT DETECTED
Opiates: NOT DETECTED
TETRAHYDROCANNABINOL: POSITIVE — AB

## 2016-07-17 LAB — CBC
HCT: 39.4 % (ref 39.0–52.0)
HEMOGLOBIN: 13.6 g/dL (ref 13.0–17.0)
MCH: 30.3 pg (ref 26.0–34.0)
MCHC: 34.5 g/dL (ref 30.0–36.0)
MCV: 87.8 fL (ref 78.0–100.0)
Platelets: 186 10*3/uL (ref 150–400)
RBC: 4.49 MIL/uL (ref 4.22–5.81)
RDW: 15 % (ref 11.5–15.5)
WBC: 5.6 10*3/uL (ref 4.0–10.5)

## 2016-07-17 LAB — ACETAMINOPHEN LEVEL

## 2016-07-17 LAB — SALICYLATE LEVEL

## 2016-07-17 LAB — ETHANOL: Alcohol, Ethyl (B): <5 mg/dL (ref <5)

## 2016-07-17 MED ORDER — ACETAMINOPHEN 325 MG PO TABS
650.0000 mg | ORAL_TABLET | Freq: Once | ORAL | Status: AC
  Administered 2016-07-17: 650 mg via ORAL

## 2016-07-17 MED ORDER — IBUPROFEN 400 MG PO TABS
400.0000 mg | ORAL_TABLET | Freq: Four times a day (QID) | ORAL | Status: DC | PRN
  Administered 2016-07-18 – 2016-07-20 (×4): 400 mg via ORAL
  Filled 2016-07-17 (×4): qty 1

## 2016-07-17 MED ORDER — CARBAMAZEPINE ER 200 MG PO TB12
200.0000 mg | ORAL_TABLET | Freq: Two times a day (BID) | ORAL | Status: DC
  Administered 2016-07-18 – 2016-07-20 (×6): 200 mg via ORAL
  Filled 2016-07-17 (×7): qty 1

## 2016-07-17 MED ORDER — ACETAMINOPHEN 325 MG PO TABS
ORAL_TABLET | ORAL | Status: AC
  Filled 2016-07-17: qty 2

## 2016-07-17 NOTE — ED Notes (Signed)
PT c/o headache, tylenol 650mg  PO given for same

## 2016-07-17 NOTE — ED Triage Notes (Signed)
Per Pt, Pt is coming from home with complaints of SOB, dizziness, Migraines, nose bleeds, and intermittent bilateral arm and leg numbness. Pt reports that it has been taking place for three weeks. [facility] and "nervous break down" recently. Pt reports falling on his left side, but denies hitting head.

## 2016-07-17 NOTE — ED Provider Notes (Signed)
[facility]-EMERGENCY DEPT Provider Note   CSN: [id number] Arrival date & time: 07/17/16  1303     History   Chief Complaint Chief Complaint  Patient presents with  . Shortness of Breath  . Suicidal    HPI Dean Howard is a 51 y.o. male.  HPI Pt has been having trouble with dizziness, headaches for several weeks.   He has had back pain and arm pain. He has had numbness in his arms.   He has not been eating well.  Pt has been depressed and getting more angry and agitated.  Pt has been thinking about hurting himself and others.  He usually goes to [facility] for his mental problems.  He was last seen in December.   Past Medical History:  Diagnosis Date  . Coronary artery disease   . MI, old   . Tick fever     Patient Active Problem List   Diagnosis Date Noted  . 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 CHEST PAIN, PLEURITIC 09/16/2007    Past Surgical History:  Procedure Laterality Date  . HERNIA REPAIR    . KNEE SURGERY         Home Medications    Prior to Admission medications   Medication Sig Start Date End Date Taking? Authorizing Provider  acetaminophen (TYLENOL) 500 MG tablet Take 500 mg by mouth every 6 (six) hours as needed for moderate pain.    Yes Historical Provider, MD  carbamazepine (TEGRETOL XR) 200 MG 12 hr tablet Take 1 tablet (200 mg total) by mouth 2 (two) times daily. 06/04/16  Yes Charm Rings, NP  ibuprofen (ADVIL,MOTRIN) 200 MG tablet Take 400 mg by mouth every 6 (six) hours as needed for moderate pain.    Yes Historical Provider, MD    Family History No family history on file.  Social History Social History  Substance Use Topics  . Smoking status: Current Every Day Smoker    Packs/day: 0.25    Types: Cigarettes  . Smokeless tobacco: Never Used  . Alcohol use Yes     Allergies   Patient has no known allergies.   Review of  Systems Review of Systems  All other systems reviewed and are negative.    Physical Exam Updated Vital Signs BP 116/78   Pulse (!) 54   Temp 98.6 F (37 C) (Oral)   Resp 15   Ht 6\' 3"  (1.905 m)   Wt 72.6 kg   SpO2 99%   BMI 20.00 kg/m   Physical Exam  Constitutional: He is oriented to person, place, and time. He appears well-developed and well-nourished. No distress.  HENT:  Head: Normocephalic and atraumatic.  Right Ear: External ear normal.  Left Ear: External ear normal.  Mouth/Throat: Oropharynx is clear and moist.  Eyes: Conjunctivae are normal. Right eye exhibits no discharge. Left eye exhibits no discharge. No scleral icterus.  Neck: Neck supple. No tracheal deviation present.  Cardiovascular: Normal rate, regular rhythm and intact distal pulses.   Pulmonary/Chest: Effort normal and breath sounds normal. No stridor. No respiratory distress. He has no wheezes. He has no rales.  Abdominal: Soft. Bowel sounds are normal. He exhibits no distension. There is no tenderness. There is no rebound and no guarding.  Musculoskeletal: He exhibits no edema or tenderness.  Neurological: He is alert and oriented to person, place, and time. He has normal strength. No cranial nerve deficit (  no facial droop, extraocular movements intact, no slurred speech) or sensory deficit. He exhibits normal muscle tone. He displays no seizure activity. Coordination normal.  No pronator drift bilateral upper extrem, able to hold both legs off bed for 5 seconds, sensation intact in all extremities, no visual field cuts, no left or right sided neglect, normal finger-nose exam bilaterally, no nystagmus noted   Skin: Skin is warm and dry. No rash noted.  Psychiatric: He has a normal mood and affect.  Nursing note and vitals reviewed.    ED Treatments / Results  Labs (all labs ordered are listed, but only abnormal results are displayed) Labs Reviewed  COMPREHENSIVE METABOLIC PANEL - Abnormal; Notable  for the following:       Result Value   Potassium 3.3 (*)    Chloride 99 (*)    Creatinine, Ser 1.53 (*)    AST 114 (*)    ALT 105 (*)    Alkaline Phosphatase 251 (*)    GFR calc non Af Amer 52 (*)    GFR calc Af Amer 60 (*)    All other components within normal limits  ACETAMINOPHEN LEVEL - Abnormal; Notable for the following:    Acetaminophen (Tylenol), Serum <10 (*)    All other components within normal limits  RAPID URINE DRUG SCREEN, HOSP PERFORMED - Abnormal; Notable for the following:    Tetrahydrocannabinol POSITIVE (*)    All other components within normal limits  ETHANOL  SALICYLATE LEVEL  CBC  HEPATITIS PANEL, ACUTE    EKG  EKG Interpretation  Date/Time:  Thursday July 17 2016 13:57:18 EST Ventricular Rate:  87 PR Interval:  156 QRS Duration: 92 QT Interval:  386 QTC Calculation: 464 R Axis:   -50 Text Interpretation:  Normal sinus rhythm Left anterior fascicular block Minimal voltage criteria for LVH, may be normal variant Septal infarct , age undetermined Abnormal ECG Confirmed by RAY MD, Duwayne Heck [phone]) on 07/17/2016 3:59:04 PM       Radiology Dg Chest 2 View  Result Date: 07/17/2016 CLINICAL DATA:  Shortness of breath and headache over the last 3 weeks. EXAM: CHEST  2 VIEW COMPARISON:  09/06/2008 FINDINGS: Heart size is normal. Mediastinal shadows are normal except for early calcification of the thoracic aorta. I think there is mild bronchial thickening. There is mild interstitial prominence in a tiny amount of fluid in the fissures. This could go along with interstitial pneumonitis or mild interstitial edema. No airspace filling, collapse or effusion. Button artifact overlies the left upper chest. No significant bone finding. IMPRESSION: Mild interstitial prominence. Small amount of fluid in the fissures. Findings could go along with interstitial pneumonitis or mild interstitial edema. Electronically Signed   By: Paulina Fusi M.D.   On: 07/17/2016 15:31    US Abdomen Complete  Result Date: 07/17/2016 CLINICAL DATA:  51 year old male with elevated LFTs and poor appetite. EXAM: ABDOMEN ULTRASOUND COMPLETE COMPARISON:  Abdominal CT dated 09/02/2007 FINDINGS: Gallbladder: No gallstones or wall thickening visualized. No sonographic Murphy sign noted by sonographer. Common bile duct: Diameter: 4 mm Liver: Mild increased hepatic echotexture likely mild fatty infiltration. IVC: No abnormality visualized. Pancreas: Visualized portion unremarkable. Spleen: Size and appearance within normal limits. Right Kidney: Length: 12 cm. Echogenicity within normal limits. No mass or hydronephrosis visualized. Left Kidney: Length: 12 cm. Echogenicity within normal limits. No mass or hydronephrosis visualized. Abdominal aorta: Top-normal aortic size measuring 2 cm in diameter. No aneurysmal dilatation. There is minimal atherosclerotic calcification of the distal aorta. Other  findings: None. IMPRESSION: Mild fatty infiltration of the liver otherwise unremarkable abdominal ultrasound. Electronically Signed   By: Elgie CollardArash  Radparvar M.D.   On: 07/17/2016 22:14    Procedures Procedures (including critical care time)  Medications Ordered in ED Medications  carbamazepine (TEGRETOL XR) 12 hr tablet 200 mg (not administered)  ibuprofen (ADVIL,MOTRIN) tablet 400 mg (not administered)  acetaminophen (TYLENOL) tablet 650 mg (650 mg Oral Given 07/17/16 1839)     Initial Impression / Assessment and Plan / ED Course  I have reviewed the triage vital signs and the nursing notes.  Pertinent labs & imaging results that were available during my care of the patient were reviewed by me and considered in my medical decision making (see chart for details).   Pt is medically stable. Elevated lfts without acute findings on us.  Pt does not have any focal ttp.  Will send off hepatitis panel but that can be followed up as an outpatient.   Clear for psychiatric eval.  Final Clinical  Impressions(s) / ED Diagnoses   Final diagnoses:  Suicidal ideation  Elevated LFTs    New Prescriptions New Prescriptions   No medications on file     Linwood DibblesJon Ryleigh Buenger, MD 07/17/16 2257

## 2016-07-18 DIAGNOSIS — F333 Major depressive disorder, recurrent, severe with psychotic symptoms: Secondary | ICD-10-CM | POA: Diagnosis present

## 2016-07-18 NOTE — Consult Note (Signed)
[facility] Telepsychiatry Consult   Reason for Consult:  Suicidal and homicidal ideations Referring Physician:  EDP Patient Identification: Dean Howard MRN:  [id number] Principal Diagnosis: MDD (major depressive disorder), recurrent, severe, with psychosis ([place]) Diagnosis:   Patient Active Problem List   Diagnosis Date Noted  . MDD (major depressive disorder), recurrent, severe, with psychosis ([place]) [F33.3] 07/18/2016  . Adjustment disorder with mixed disturbance of emotions and conduct [F43.25] 06/04/2016  . 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 CHEST PAIN, PLEURITIC [R07.1] 09/16/2007    Total Time spent with patient: 30 minutes  Subjective:   Dean Howard is a 51 y.o. male patient presents with paranoid ideation and OD on 16 pills of over-the-counter medication. Pt seen and chart reviewed. Pt is alert/oriented x4, calm, cooperative, and appropriate to situation. Pt denies homicidal ideation and psychosis and does not appear to be responding to internal stimuli. Pt reports that he intentionally overdosed because he felt overwhelmed and that his "Tegretol isn't working at all to help my mood."   HPI:  I have reviewed and concur with HPI elements below, modified as follows: "Dean Howard is an 51 y.o. male, African American who presents to [facility] per ED report: having trouble with dizziness, headaches for several weeks. He has had back pain and arm pain. He has had numbness in his arms. He has not been eating well. Pt has been depressed and getting more angry and agitated. Pt has been thinking about hurting himself and others. He usually goes to [facility] for his mental problems. He was last seen in December. Patient states primary c/o anger management problems. Patient states that he is here for majority of medical concerns and c/o headaches past x 3 weeks. Patient states that he currently lives with mother.  Patient denies current  SI, but states did have earlier. Patient acknowledges HI thoughts which are for most pat constant, but no plan/intent. Patient denies AVH. Patient denies S.A., but states does drink occasionally, and smokes some marijuana on occasion. Patient denies past hx. Of inpatient psych care. Patient is seen outpatient for psych care at [facility].  Patient is dressed in scrubs and is alert and oriented x4. Patient speech was within normal limits and motor behavior appeared normal. Patient thought process is coherent. Patient does not appear to be responding to internal stimuli. Patient was cooperative throughout the assessment and states that he/ she is agreeable to inpatient psychiatric treatment."  Pt spent the night in the ED without incident. Today, on 07/18/2016 , pt seen and chart reviewed as above.   Past Psychiatric History: substance abuse  Risk to Self: Suicidal Ideation: Yes-Currently Present Suicidal Intent: No Is patient at risk for suicide?: Yes Suicidal Plan?: No Access to Means: No What has been your use of drugs/alcohol within the last 12 months?: some alcohol per pt. How many times?: 1 Other Self Harm Risks: none Triggers for Past Attempts: Unpredictable Intentional Self Injurious Behavior: None Risk to Others:None Prior Inpatient Therapy: Prior Inpatient Therapy: No Prior Therapy Dates: n/a Prior Therapy Facilty/Provider(s): n/a Reason for Treatment: n/a Prior Outpatient Therapy: Prior Outpatient Therapy: Yes Prior Therapy Dates: current Prior Therapy Facilty/Provider(s): [facility] Reason for Treatment: med mngmt. Does patient have an ACCT team?: No Does patient have Intensive In-House Services?  : No Does patient have [facility] services? : Yes Does patient have [facility] services?: No  Past Medical History:  Past Medical History:  Diagnosis Date  . Coronary artery disease   .  MI, old   . Tick fever     Past Surgical History:  Procedure Laterality Date  . HERNIA REPAIR    .  KNEE SURGERY     Family History: No family history on file. Family Psychiatric  History: none Social History:  History  Alcohol Use  . Yes     History  Drug Use  . Types: Marijuana    Social History   Social History  . Marital status: Single    Spouse name: N/A  . Number of children: N/A  . Years of education: N/A   Social History Main Topics  . Smoking status: Current Every Day Smoker    Packs/day: 0.25    Types: Cigarettes  . Smokeless tobacco: Never Used  . Alcohol use Yes  . Drug use: Yes    Types: Marijuana  . Sexual activity: Not Asked   Other Topics Concern  . None   Social History Narrative  . None   Additional Social History:    Allergies:  No Known Allergies  Labs:  Results for orders placed or performed during the hospital encounter of 07/17/16 (from the past 48 hour(s))  Comprehensive metabolic panel     Status: Abnormal   Collection Time: 07/17/16  2:12 PM  Result Value Ref Range   Sodium 135 135 - 145 mmol/L   Potassium 3.3 (L) 3.5 - 5.1 mmol/L   Chloride 99 (L) 101 - 111 mmol/L   CO2 24 22 - 32 mmol/L   Glucose, Bld 78 65 - 99 mg/dL   BUN 6 6 - 20 mg/dL   Creatinine, Ser 1.53 (H) 0.61 - 1.24 mg/dL   Calcium 9.0 8.9 - 10.3 mg/dL   Total Protein 7.0 6.5 - 8.1 g/dL   Albumin 3.5 3.5 - 5.0 g/dL   AST 114 (H) 15 - 41 U/L   ALT 105 (H) 17 - 63 U/L   Alkaline Phosphatase 251 (H) 38 - 126 U/L   Total Bilirubin 0.5 0.3 - 1.2 mg/dL   GFR calc non Af Amer 52 (L) >60 mL/min   GFR calc Af Amer 60 (L) >60 mL/min    Comment: (NOTE) The eGFR has been calculated using the CKD EPI equation. This calculation has not been validated in all clinical situations. eGFR's persistently <60 mL/min signify possible Chronic Kidney Disease.    Anion gap 12 5 - 15  Ethanol     Status: None   Collection Time: 07/17/16  2:12 PM  Result Value Ref Range   Alcohol, Ethyl (B) <5 <5 mg/dL    Comment:        LOWEST DETECTABLE LIMIT FOR SERUM ALCOHOL IS 5 mg/dL FOR  MEDICAL PURPOSES ONLY   Salicylate level     Status: None   Collection Time: 07/17/16  2:12 PM  Result Value Ref Range   Salicylate Lvl <8.6 2.8 - 30.0 mg/dL  Acetaminophen level     Status: Abnormal   Collection Time: 07/17/16  2:12 PM  Result Value Ref Range   Acetaminophen (Tylenol), Serum <10 (L) 10 - 30 ug/mL    Comment:        THERAPEUTIC CONCENTRATIONS VARY SIGNIFICANTLY. A RANGE OF 10-30 ug/mL MAY BE AN EFFECTIVE CONCENTRATION FOR MANY PATIENTS. HOWEVER, SOME ARE BEST TREATED AT CONCENTRATIONS OUTSIDE THIS RANGE. ACETAMINOPHEN CONCENTRATIONS >150 ug/mL AT 4 HOURS AFTER INGESTION AND >50 ug/mL AT 12 HOURS AFTER INGESTION ARE OFTEN ASSOCIATED WITH TOXIC REACTIONS.   cbc     Status: None  Collection Time: 07/17/16  2:12 PM  Result Value Ref Range   WBC 5.6 4.0 - 10.5 K/uL   RBC 4.49 4.22 - 5.81 MIL/uL   Hemoglobin 13.6 13.0 - 17.0 g/dL   HCT 39.4 39.0 - 52.0 %   MCV 87.8 78.0 - 100.0 fL   MCH 30.3 26.0 - 34.0 pg   MCHC 34.5 30.0 - 36.0 g/dL   RDW 15.0 11.5 - 15.5 %   Platelets 186 150 - 400 K/uL  Rapid urine drug screen (hospital performed)     Status: Abnormal   Collection Time: 07/17/16  2:12 PM  Result Value Ref Range   Opiates NONE DETECTED NONE DETECTED   Cocaine NONE DETECTED NONE DETECTED   Benzodiazepines NONE DETECTED NONE DETECTED   Amphetamines NONE DETECTED NONE DETECTED   Tetrahydrocannabinol POSITIVE (A) NONE DETECTED   Barbiturates NONE DETECTED NONE DETECTED    Comment:        DRUG SCREEN FOR MEDICAL PURPOSES ONLY.  IF CONFIRMATION IS NEEDED FOR ANY PURPOSE, NOTIFY LAB WITHIN 5 DAYS.        LOWEST DETECTABLE LIMITS FOR URINE DRUG SCREEN Drug Class       Cutoff (ng/mL) Amphetamine      1000 Barbiturate      200 Benzodiazepine   106 Tricyclics       269 Opiates          300 Cocaine          300 THC              50     Current Facility-Administered Medications  Medication Dose Route Frequency Provider Last Rate Last Dose  .  carbamazepine (TEGRETOL XR) 12 hr tablet 200 mg  200 mg Oral BID Dorie Rank, MD   200 mg at 07/18/16 1110  . ibuprofen (ADVIL,MOTRIN) tablet 400 mg  400 mg Oral Q6H PRN Dorie Rank, MD   400 mg at 07/18/16 4854   Current Outpatient Prescriptions  Medication Sig Dispense Refill  . acetaminophen (TYLENOL) 500 MG tablet Take 500 mg by mouth every 6 (six) hours as needed for moderate pain.     . carbamazepine (TEGRETOL XR) 200 MG 12 hr tablet Take 1 tablet (200 mg total) by mouth 2 (two) times daily. 60 tablet 0  . ibuprofen (ADVIL,MOTRIN) 200 MG tablet Take 400 mg by mouth every 6 (six) hours as needed for moderate pain.       Musculoskeletal: Strength & Muscle Tone: within normal limits Gait & Station: normal Patient leans: N/A  Psychiatric Specialty Exam: Physical Exam  Constitutional: He is oriented to person, place, and time. He appears well-developed and well-nourished.  HENT:  Head: Normocephalic.  Neck: Normal range of motion.  Respiratory: Effort normal.  Musculoskeletal: Normal range of motion.  Neurological: He is alert and oriented to person, place, and time.  Skin: Skin is warm and dry.  Psychiatric: He has a normal mood and affect. His speech is normal and behavior is normal. Judgment and thought content normal. Cognition and memory are normal.    Review of Systems  Psychiatric/Behavioral: Positive for depression, hallucinations (paranoid ideation), substance abuse and suicidal ideas. The patient is nervous/anxious.   All other systems reviewed and are negative.   Blood pressure 106/65, pulse 75, temperature 98.9 F (37.2 C), temperature source Oral, resp. rate 20, height '6\' 3"'  (1.905 m), weight 72.6 kg (160 lb), SpO2 97 %.Body mass index is 20 kg/m.  General Appearance: Casual and Fairly Groomed  Eye  Contact:  Poor  Speech:  Normal Rate  Volume:  Normal  Mood:  Anxious and Depressed  Affect:  Appropriate and Congruent  Thought Process:  Coherent and Descriptions of  Associations: Intact  Orientation:  Full (Time, Place, and Person)  Thought Content:  WDL  Suicidal Thoughts:  No  Homicidal Thoughts:  No  Memory:  Immediate;   Good Recent;   Good Remote;   Good  Judgement:  Fair  Insight:  Fair  Psychomotor Activity:  Normal  Concentration:  Concentration: Good and Attention Span: Good  Recall:  Good  Fund of Knowledge:  Fair  Language:  Good  Akathisia:  No  Handed:  Right  AIMS (if indicated):     Assets:  Housing Leisure Time Physical Health Resilience Social Support  ADL's:  Intact  Cognition:  WNL  Sleep:      Treatment Plan Summary: MDD (major depressive disorder), recurrent, severe, with psychosis ([place]) unstable, with paranoid ideation, warrants inpatient admission for psychiatric stabilization   Disposition: Recommend psychiatric Inpatient admission when medically cleared.  Benjamine Mola, [place] 07/18/2016 4:38 PM

## 2016-07-18 NOTE — ED Notes (Addendum)
Pt wanded by security. Belongings sheet completed and belongings placed in locked cabinet in room. Pt valuables sent to security: one cellphone, one wallet with misc cards, and one cross necklace with broken chain.

## 2016-07-18 NOTE — BH Assessment (Signed)
Under Review: [place], [place], [address], [place], [place], [place]

## 2016-07-18 NOTE — ED Notes (Signed)
Regular Diet was ordered for patient for Lunch. 

## 2016-07-18 NOTE — Progress Notes (Signed)
Per Nira ConnJason Berry, NP recommend a.m. Psych evaluation. Herminio Kniskern K. Sherlon HandingHarris, LCAS-A, LPC-A, [facility]  Counselor 07/18/2016 3:29 AM

## 2016-07-18 NOTE — ED Notes (Addendum)
Patient sister called and wanted to speak with patient, but patient stated he didn't want to talk at this time, so Sister Number was taken and given to pt. To call back later.

## 2016-07-18 NOTE — ED Notes (Signed)
TTS at bedside. 
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
## 2016-07-19 LAB — HEPATITIS PANEL, ACUTE
HCV AB: 0.1 {s_co_ratio} (ref 0.0–0.9)
HEP A IGM: NEGATIVE
HEP B C IGM: NEGATIVE
Hepatitis B Surface Ag: NEGATIVE

## 2016-07-19 MED ORDER — POTASSIUM CHLORIDE CRYS ER 20 MEQ PO TBCR
40.0000 meq | EXTENDED_RELEASE_TABLET | Freq: Two times a day (BID) | ORAL | Status: AC
  Administered 2016-07-19 (×2): 40 meq via ORAL
  Filled 2016-07-19 (×2): qty 2

## 2016-07-19 NOTE — ED Notes (Addendum)
Pt signed consent to release info form - [facility] semt [facility] records, and original placed on chart. Discussed tx plan w/pt's mother and sister as requested by pt - while visiting w/pt.

## 2016-07-19 NOTE — ED Notes (Signed)
Pt's mother and sister visiting. Pt had asked for med for headache when advised him they were here to visit. Pt's mother requesting to be advised as to why he is here d/t told her he was coming to ED d/t headache. Advised mother advised her unable to discuss w/o pt's written permission - voiced understanding.

## 2016-07-19 NOTE — BHH Counselor (Signed)
TTS reassessment: Pt presented with a bizarre affect with pressured speech. He currently endorses depression and states "I've been depressed for a minute" but denies SI. He states that he feels agitated and there are people that he wants to hurt but was not specific. He states that [facility]. He states that his mom wanted him to come to the hospital because she is afraid he is going to hurt someone. He stated "if they leave me alone I'll be fine but if someone pisses me off I'll be like the [place] [employer]". He states that he has "visions" of being attacked or attacking someone else. He states that he often has nightmares where he is hurting people. Pt meets inpatient criteria for homicidal ideations.   [address]Kristin Cas Tracz BondurantLPC, [address]

## 2016-07-19 NOTE — ED Notes (Signed)
Pt eating dinner
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
## 2016-07-19 NOTE — ED Notes (Signed)
Re-TTS being performed.  

## 2016-07-19 NOTE — ED Notes (Signed)
Patient was given a snack and a cup of water, and a regular diet was taken for Lunch.

## 2016-07-20 ENCOUNTER — Inpatient Hospital Stay (HOSPITAL_COMMUNITY)
Admission: AD | Admit: 2016-07-20 | Discharge: 2016-07-24 | DRG: 882 | Disposition: A | Discharge status: Home / Self Care | Payer: Federal, State, Local not specified - Other | Source: Intra-hospital | Attending: Psychiatry | Admitting: Psychiatry

## 2016-07-20 ENCOUNTER — Encounter (HOSPITAL_COMMUNITY): Payer: Self-pay

## 2016-07-20 DIAGNOSIS — F4320 Adjustment disorder, unspecified: Principal | ICD-10-CM | POA: Diagnosis present

## 2016-07-20 DIAGNOSIS — R45851 Suicidal ideations: Secondary | ICD-10-CM | POA: Diagnosis present

## 2016-07-20 DIAGNOSIS — Z79899 Other long term (current) drug therapy: Secondary | ICD-10-CM | POA: Diagnosis not present

## 2016-07-20 DIAGNOSIS — Z833 Family history of diabetes mellitus: Secondary | ICD-10-CM | POA: Diagnosis not present

## 2016-07-20 DIAGNOSIS — F333 Major depressive disorder, recurrent, severe with psychotic symptoms: Secondary | ICD-10-CM | POA: Diagnosis present

## 2016-07-20 DIAGNOSIS — G4700 Insomnia, unspecified: Secondary | ICD-10-CM | POA: Diagnosis present

## 2016-07-20 DIAGNOSIS — F411 Generalized anxiety disorder: Secondary | ICD-10-CM | POA: Diagnosis present

## 2016-07-20 DIAGNOSIS — F1020 Alcohol dependence, uncomplicated: Secondary | ICD-10-CM | POA: Diagnosis not present

## 2016-07-20 DIAGNOSIS — I2510 Atherosclerotic heart disease of native coronary artery without angina pectoris: Secondary | ICD-10-CM | POA: Diagnosis present

## 2016-07-20 DIAGNOSIS — I252 Old myocardial infarction: Secondary | ICD-10-CM

## 2016-07-20 DIAGNOSIS — Z23 Encounter for immunization: Secondary | ICD-10-CM

## 2016-07-20 DIAGNOSIS — F1220 Cannabis dependence, uncomplicated: Secondary | ICD-10-CM | POA: Diagnosis not present

## 2016-07-20 DIAGNOSIS — F17210 Nicotine dependence, cigarettes, uncomplicated: Secondary | ICD-10-CM | POA: Diagnosis present

## 2016-07-20 DIAGNOSIS — F3162 Bipolar disorder, current episode mixed, moderate: Secondary | ICD-10-CM

## 2016-07-20 MED ORDER — ALUM & MAG HYDROXIDE-SIMETH 200-200-20 MG/5ML PO SUSP: 30.0000 mL | ORAL | Status: DC | PRN

## 2016-07-20 MED ORDER — ACETAMINOPHEN 325 MG PO TABS: 650.0000 mg | ORAL_TABLET | Freq: Four times a day (QID) | ORAL | Status: DC | PRN

## 2016-07-20 MED ORDER — MAGNESIUM HYDROXIDE 400 MG/5ML PO SUSP: 30.0000 mL | Freq: Every day | ORAL | Status: DC | PRN

## 2016-07-20 MED ORDER — IBUPROFEN 600 MG PO TABS
600.0000 mg | ORAL_TABLET | Freq: Four times a day (QID) | ORAL | Status: DC | PRN
  Administered 2016-07-20 – 2016-07-22 (×6): 600 mg via ORAL
  Filled 2016-07-20 (×6): qty 1

## 2016-07-20 MED ORDER — INFLUENZA VAC SPLIT QUAD 0.5 ML IM SUSY
0.5000 mL | PREFILLED_SYRINGE | INTRAMUSCULAR | Status: AC
  Administered 2016-07-21: 0.5 mL via INTRAMUSCULAR
  Filled 2016-07-20: qty 0.5

## 2016-07-20 MED ORDER — HYDROXYZINE HCL 25 MG PO TABS: 25.0000 mg | ORAL_TABLET | Freq: Four times a day (QID) | ORAL | Status: DC | PRN

## 2016-07-20 MED ORDER — PNEUMOCOCCAL VAC POLYVALENT 25 MCG/0.5ML IJ INJ
0.5000 mL | INJECTION | INTRAMUSCULAR | Status: AC
  Administered 2016-07-21: 0.5 mL via INTRAMUSCULAR

## 2016-07-20 MED ORDER — TRAZODONE HCL 50 MG PO TABS
50.0000 mg | ORAL_TABLET | Freq: Every evening | ORAL | Status: DC | PRN
  Administered 2016-07-20: 50 mg via ORAL
  Filled 2016-07-20: qty 1

## 2016-07-20 NOTE — Progress Notes (Signed)
Dean Howard is a 51 year old male being admitted voluntarily to [facility] from [facility].  He came to the ED with complaints of dizziness, headaches, back and arm pain.  He reported that he is becoming more depressed and agitated with thoughts of wanting to harm himself.  He did report homicidal ideation and denies A/V hallucinations.  [facility] is [facility] but has no history if inpatient hospitalizations.  He is diagnosed with Adjustment Disorder with mixed anxiety and depressed mood.  He has a medical history of CAD and has had an MI in the past.  He has been having chronic head aches and is upset that he wasn't treated at the hospital.  He denies any SI/HI at this time.  He is able to contract for safety on the unit.  Oriented him to the unit.  Admission paperwork completed and signed.  Belongings searched and secured in locker # 50.  Skin assessment completed and no skin issues noted.  Q 15 minute checks initiated for safety.  We will monitor the progress towards his goals.

## 2016-07-20 NOTE — Tx Team (Signed)
Initial Treatment Plan 07/20/2016 6:24 PM Dean MathRonald E Godman [id number]    PATIENT STRESSORS: Financial difficulties Occupational concerns Substance abuse   PATIENT STRENGTHS: Wellsite geologistCommunication skills General fund of knowledge Motivation for treatment/growth Physical Health   PATIENT IDENTIFIED PROBLEMS: Depression  Suicidal ideation/homicidal ideation  Substance abuse  "I want to be able to go to sleep and stay asleep"  "Breathing"             DISCHARGE CRITERIA:  Improved stabilization in mood, thinking, and/or behavior Verbal commitment to aftercare and medication compliance Withdrawal symptoms are absent or subacute and managed without 24-hour nursing intervention  PRELIMINARY DISCHARGE PLAN: Outpatient therapy Medication management  PATIENT/FAMILY INVOLVEMENT: This treatment plan has been presented to and reviewed with the patient, Dean Howard.  The patient and family have been given the opportunity to ask questions and make suggestions.  Levin BaconHeather V Terrel Nesheiwat, RN 07/20/2016, 6:24 PM

## 2016-07-20 NOTE — ED Notes (Signed)
Pt finished lunch tray. Pt calm and watching TV

## 2016-07-20 NOTE — Progress Notes (Signed)
BHH Group Notes:  (Nursing/MHT/Case Management/Adjunct)  Date:  07/20/2016  Time:  9:59 PM  Type of Therapy:  Psychoeducational Skills  Participation Level:  Active  Participation Quality:  Appropriate  Affect:  Blunted  Cognitive:  Appropriate  Insight:  Appropriate  Engagement in Group:  Engaged  Modes of Intervention:  Education  Summary of Progress/Problems: The patient verbalized that he had a good day despite the fact that he has been experiencing headaches throughout the day. He states that he is working on trying to "maintain" himself. [place]   Hazle CocaGOODMAN, Shahd Occhipinti S 07/20/2016, 9:59 PM

## 2016-07-20 NOTE — BHH Counselor (Signed)
Pt reassessed at 8:44.  Pt reported that he continues to have thoughts of people harming him, and he harming them.  He also reported significant bad dreams where he murders people.  Pt endorsed ongoing (but reduced) auditory hallucination, and he expressed a desire to get help.

## 2016-07-20 NOTE — ED Notes (Signed)
Pt sitting on bed watching tv. Nad.

## 2016-07-20 NOTE — ED Notes (Signed)
Pt voiced agreement w/tx plan [facility] Dr Jama Flavorsobos. Signed consent forms - [facility] - copy to [facility] Records - original [facility]. Pt on phone at nurses' desk advising his mother.

## 2016-07-20 NOTE — ED Notes (Signed)
Pt on the way to shower, declined snack; Lunch tray ordered

## 2016-07-20 NOTE — ED Notes (Signed)
Pt receiving regular breakfast tray.

## 2016-07-20 NOTE — ED Notes (Signed)
Re-TTS being performed.  

## 2016-07-20 NOTE — Progress Notes (Signed)
D: Pt was pleasant but slightly anxious during the assessment. Informed the writer that he's had a headache for several weeks. Stated he went to the ER, "they did chest xrays and wasn't asked to". Pt stated, he informed the staff that "he wasn't going to pay for it". Pt didn't give a clear cut circumstance as to why he was admitted to bhh. However, did inform the writer of his history. Stated, "if I feel threatened then I become a threat".  Pt has no questions or concerns.   A:Prn ibuprofen given.  Support and encouragement was offered. 15 min checks continued for safety.  R: Pt remains safe.

## 2016-07-20 NOTE — BHH Counselor (Signed)
[facility] Please call report to [phone].  Attending is Dr. Jama Flavorsobos.

## 2016-07-21 DIAGNOSIS — F17210 Nicotine dependence, cigarettes, uncomplicated: Secondary | ICD-10-CM

## 2016-07-21 DIAGNOSIS — Z79899 Other long term (current) drug therapy: Secondary | ICD-10-CM

## 2016-07-21 DIAGNOSIS — Z8489 Family history of other specified conditions: Secondary | ICD-10-CM

## 2016-07-21 DIAGNOSIS — F3162 Bipolar disorder, current episode mixed, moderate: Secondary | ICD-10-CM

## 2016-07-21 DIAGNOSIS — F1020 Alcohol dependence, uncomplicated: Secondary | ICD-10-CM

## 2016-07-21 DIAGNOSIS — Z818 Family history of other mental and behavioral disorders: Secondary | ICD-10-CM

## 2016-07-21 DIAGNOSIS — Z833 Family history of diabetes mellitus: Secondary | ICD-10-CM

## 2016-07-21 DIAGNOSIS — Z813 Family history of other psychoactive substance abuse and dependence: Secondary | ICD-10-CM

## 2016-07-21 DIAGNOSIS — F1220 Cannabis dependence, uncomplicated: Secondary | ICD-10-CM

## 2016-07-21 MED ORDER — VITAMIN B-1 100 MG PO TABS
100.0000 mg | ORAL_TABLET | Freq: Every day | ORAL | Status: DC
  Administered 2016-07-22 – 2016-07-24 (×3): 100 mg via ORAL
  Filled 2016-07-21 (×5): qty 1

## 2016-07-21 MED ORDER — ADULT MULTIVITAMIN W/MINERALS CH
1.0000 | ORAL_TABLET | Freq: Every day | ORAL | Status: DC
  Administered 2016-07-21 – 2016-07-24 (×4): 1 via ORAL
  Filled 2016-07-21 (×7): qty 1

## 2016-07-21 MED ORDER — OLANZAPINE 5 MG PO TABS
5.0000 mg | ORAL_TABLET | Freq: Every day | ORAL | Status: DC
  Administered 2016-07-21: 5 mg via ORAL
  Filled 2016-07-21 (×3): qty 1

## 2016-07-21 MED ORDER — HYDROXYZINE HCL 25 MG PO TABS
25.0000 mg | ORAL_TABLET | Freq: Four times a day (QID) | ORAL | Status: AC | PRN
  Administered 2016-07-23: 25 mg via ORAL
  Filled 2016-07-21: qty 1

## 2016-07-21 MED ORDER — LOPERAMIDE HCL 2 MG PO CAPS: 2.0000 mg | ORAL_CAPSULE | ORAL | Status: AC | PRN

## 2016-07-21 MED ORDER — POTASSIUM CHLORIDE CRYS ER 10 MEQ PO TBCR
10.0000 meq | EXTENDED_RELEASE_TABLET | Freq: Two times a day (BID) | ORAL | Status: AC
  Administered 2016-07-21 – 2016-07-23 (×4): 10 meq via ORAL
  Filled 2016-07-21 (×4): qty 1

## 2016-07-21 MED ORDER — LORAZEPAM 1 MG PO TABS: 1.0000 mg | ORAL_TABLET | Freq: Four times a day (QID) | ORAL | Status: AC | PRN

## 2016-07-21 MED ORDER — ONDANSETRON 4 MG PO TBDP: 4.0000 mg | ORAL_TABLET | Freq: Four times a day (QID) | ORAL | Status: AC | PRN

## 2016-07-21 MED ORDER — THIAMINE HCL 100 MG/ML IJ SOLN
100.0000 mg | Freq: Once | INTRAMUSCULAR | Status: AC
  Administered 2016-07-21: 100 mg via INTRAMUSCULAR
  Filled 2016-07-21: qty 2

## 2016-07-21 MED ORDER — TRAZODONE HCL 50 MG PO TABS
50.0000 mg | ORAL_TABLET | Freq: Every evening | ORAL | Status: DC | PRN
  Administered 2016-07-21 – 2016-07-23 (×3): 50 mg via ORAL
  Filled 2016-07-21: qty 7
  Filled 2016-07-21 (×3): qty 1

## 2016-07-21 NOTE — B[facility] Suicide Risk Assessment (Signed)
[facility]   Nursing information obtained from:  Patient Demographic factors:  Unemployed Current Mental Status:  NA Loss Factors:  Decrease in vocational status, Financial problems / change in socioeconomic status, Loss of significant relationship Historical Factors:  Prior suicide attempts, Family history of mental illness or substance abuse, Impulsivity Risk Reduction Factors:  Living with another person, especially a relative  Total Time spent with patient: 45 minutes Principal Problem: <principal problem not specified> Diagnosis:   Patient Active Problem List   Diagnosis Date Noted  . Adjustment disorder [F43.20] 07/20/2016  . MDD (major depressive disorder), recurrent, severe, with psychosis (HCC) [F33.3] 07/18/2016  . Adjustment disorder with mixed disturbance of emotions and conduct [F43.25] 06/04/2016  . 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 CHEST PAIN, PLEURITIC [R07.1] 09/16/2007     Continued Clinical Symptoms:  Alcohol Use Disorder Identification Test Final Score (AUDIT): 26 The "Alcohol Use Disorders Identification Test", Guidelines for Use in Primary Care, Second Edition.  World Science writerHealth Organization [facility](WHO). Score between 0-7:  no or low risk or alcohol related problems. Score between 8-15:  moderate risk of alcohol related problems. Score between 16-19:  high risk of alcohol related problems. Score 20 or above:  warrants further diagnostic evaluation for alcohol dependence and treatment.   CLINICAL FACTORS:   51 year old male, reports symptoms of depression, sadness, but at the same time reports subjective sense of irritability, angry outbursts , and presents with a tendency towards pressured speech.  Has been drinking daily , heavily, up to day of admission, but is not currently presenting with withdrawal symptoms. Has been on Tegretol over recent weeks, but does not feel it is working .   Psychiatric  Specialty Exam: Physical Exam  ROS  Blood pressure 114/65, pulse 89, temperature 98.5 F (36.9 C), temperature source Oral, resp. rate 18, height 6\' 2"  (1.88 m), weight 72.6 kg (160 lb), SpO2 99 %.Body mass index is 20.54 kg/m.   see admit note MSE    COGNITIVE FEATURES THAT CONTRIBUTE TO RISK:  Closed-mindedness and Loss of executive function    SUICIDE RISK:   Moderate:  Frequent suicidal ideation with limited intensity, and duration, some specificity in terms of plans, no associated intent, good self-control, limited dysphoria/symptomatology, some risk factors present, and identifiable protective factors, including available and accessible social support.  PLAN OF CARE: Patient will be admitted to inpatient psychiatric unit for stabilization and safety. Will provide and encourage milieu participation. Provide medication management and maked adjustments as needed.  Will follow daily.    I certify that inpatient services furnished can reasonably be expected to improve the patient's condition.   Nehemiah MassedOBOS, FERNANDO, MD 07/21/2016, 10:43 AM
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
## 2016-07-21 NOTE — [facility] Group Notes (Signed)
[facility] LCSW Group Therapy  07/21/2016 2:30 PM  Type of Therapy:  Group Therapy  Participation Level:  Active  Participation Quality:  Attentive  Affect:  Appropriate  Cognitive:  Alert  Insight:  Improving  Engagement in Therapy:  Improving  Modes of Intervention:  Discussion, Education, Socialization and Support  Summary of Progress/Problems:Balance in life: Patients will discuss the concept of balance and how it looks and feels to be unbalanced. Pt will identify areas in their life that is unbalanced and ways to become more balanced.    Dean Howard Dean Howard MSW, LCSWA  07/21/2016, 2:30 PM   

## 2016-07-21 NOTE — Progress Notes (Signed)
NUTRITION ASSESSMENT  Pt identified as at risk on the Malnutrition Screen Tool  INTERVENTION: 1. Educated patient on the importance of nutrition and encouraged intake of food and beverages. 2. Discussed weight goals. 3. Supplements: none at this time.   NUTRITION DIAGNOSIS: Unintentional weight loss related to sub-optimal intake as evidenced by pt report.   Goal: Pt to meet >/= 90% of their estimated nutrition needs.  Monitor:  PO intake  Assessment:  Pt admitted for MDD. Pt reports increasing anger and agitation PTA with SI and HI. Pt reports decreased appetite PTA. No need for oral nutrition supplements at this time but will continue to monitor for needs. Continue to encourage PO intakes of meals and snacks.   51 y.o. male  Height: Ht Readings from Last 1 Encounters:  07/20/16 6\' 2"  (1.88 m)    Weight: Wt Readings from Last 1 Encounters:  07/20/16 160 lb (72.6 kg)    Weight Hx: Wt Readings from Last 10 Encounters:  07/20/16 160 lb (72.6 kg)  07/17/16 160 lb (72.6 kg)  11/05/15 152 lb 12.8 oz (69.3 kg)  10/22/15 157 lb (71.2 kg)  09/06/15 170 lb (77.1 kg)  05/23/15 157 lb (71.2 kg)  01/28/14 169 lb (76.7 kg)  01/18/14 171 lb (77.6 kg)  09/30/07 160 lb 9.6 oz (72.8 kg)    BMI:  Body mass index is 20.54 kg/m. Pt meets criteria for normal weight based on current BMI.  Estimated Nutritional Needs: Kcal: 25-30 kcal/kg Protein: > 1 gram protein/kg Fluid: 1 ml/kcal  Diet Order: Diet regular Room service appropriate? Yes; Fluid consistency: Thin Pt is also offered choice of unit snacks mid-morning and mid-afternoon.  Pt is eating as desired.   Lab results and medications reviewed.      Trenton GammonJessica Kalee Mcclenathan, MS, RD, LDN, [facility] Inpatient Clinical Dietitian Pager # [phone] After hours/weekend pager # [phone]

## 2016-07-21 NOTE — Progress Notes (Signed)
Recreation Therapy Notes  Date: 07/21/16 Time: 0930 Location: [address] Group Room  Group Topic: Stress Management  Goal Area(s) Addresses:  Patient will verbalize importance of using healthy stress management.  Patient will identify positive emotions associated with healthy stress management.   Intervention: Stress Management  Activity :  Daily Calm.  LRT introduced the stress management technique of meditation to the group.  LRT played a meditation from the Calm App to allow patients the opportunity to engage in the meditation.  Patients were to follow along with the meditation to fully engage in the technique.  Education:  Stress Management, Discharge Planning.   Education Outcome: Acknowledges edcuation/In group clarification offered/Needs additional education  Clinical Observations/Feedback: Pt did not attend group.    Caroll RancherMarjette Aldon Hengst, LRT/CTRS         Caroll RancherLindsay, Taijah Macrae A 07/21/2016 12:20 PM

## 2016-07-21 NOTE — Progress Notes (Signed)
Patient ID: Dean Howard, male   DOB: 11/24/1965, 51 y.o.   MRN: [id number]  DAR: Pt. Denies SI/HI and A/V Hallucinations. He reports sleep is good, appetite is good, energy level is normal, and concentration is good. He rates depression 2/10, hopelessness 0/10, and anxiety 8/10. Patient does continue to report a headache and states, "it's been going on for two weeks." MD Cobos was notified of this. Support and encouragement provided to the patient to come to writer if he had any questions or concerns. Scheduled medications administered to patient per physician's orders. Patient received his influenza and pneumococcal vaccines today and tolerated well. Patient is minimal but cooperative. He is seen in the milieu and in the dayroom throughout the day. Q15 minute checks are maintained for safety.

## 2016-07-22 LAB — LIPID PANEL
Cholesterol: 146 mg/dL (ref 0–200)
HDL: 46 mg/dL (ref >40)
LDL CALC: 81 mg/dL (ref 0–99)
Total CHOL/HDL Ratio: 3.2 RATIO
Triglycerides: 95 mg/dL (ref <150)
VLDL: 19 mg/dL (ref 0–40)

## 2016-07-22 LAB — TSH: TSH: 3.879 u[IU]/mL (ref 0.350–4.500)

## 2016-07-22 LAB — HIV ANTIBODY (ROUTINE TESTING W REFLEX): HIV SCREEN 4TH GENERATION: NONREACTIVE

## 2016-07-22 MED ORDER — OLANZAPINE 10 MG PO TABS
10.0000 mg | ORAL_TABLET | Freq: Every day | ORAL | Status: DC
  Administered 2016-07-22 – 2016-07-23 (×2): 10 mg via ORAL
  Filled 2016-07-22: qty 1
  Filled 2016-07-22: qty 7
  Filled 2016-07-22 (×3): qty 1

## 2016-07-22 NOTE — Progress Notes (Signed)
[facility] MD Progress Note  07/22/2016 3:18 PM Dean Howard  MRN:  [id number] Subjective:  Patient states " I feel good today".  States " my head is no longer hurting". ( had reported headache earlier ) . At this time he is focusing on being discharged soon. Denies medication side effects. Objective : I have discussed  case with treatment team and have met with patient . Reports he is feeling much better,and at this time he is " not feeling bothered anymore"- is focusing on going home soon . He is not currently presenting with any withdrawal symptoms. No tremors, no diaphoresis, no restlessness, vitals stable . Denies any alcohol cravings. Denies any recent angry or explosive outbursts and on unit has been generally  calm, currently pleasant on approach. Chart notes indicate he has been visible in day room, going to some groups, presenting with some irritability at times . Denies medication side effects ( Was started on Zyprexa trial) . No akathisia noted .  Principal Problem: Bipolar Disorder, Mixed, Alcohol, Cannabis Abuse  Diagnosis:   Patient Active Problem List   Diagnosis Date Noted  . Adjustment disorder [F43.20] 07/20/2016  . MDD (major depressive disorder), recurrent, severe, with psychosis ([place]) [F33.3] 07/18/2016  . Adjustment disorder with mixed disturbance of emotions and conduct [F43.25] 06/04/2016  . 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 CHEST PAIN, PLEURITIC [R07.1] 09/16/2007   Total Time spent with patient: 20 minutes   Past Medical History:  Past Medical History:  Diagnosis Date  . Coronary artery disease   . MI, old   . Tick fever     Past Surgical History:  Procedure Laterality Date  . HERNIA REPAIR    . KNEE SURGERY     Family History: History reviewed. No pertinent family history.  Social History:  History  Alcohol Use  . Yes    Comment: about 14 beers per day     History  Drug Use  . Types: Marijuana    Social  History   Social History  . Marital status: Single    Spouse name: N/A  . Number of children: N/A  . Years of education: N/A   Social History Main Topics  . Smoking status: Current Every Day Smoker    Packs/day: 0.25    Types: Cigarettes  . Smokeless tobacco: Never Used  . Alcohol use Yes     Comment: about 14 beers per day  . Drug use: Yes    Types: Marijuana  . Sexual activity: Not Asked   Other Topics Concern  . None   Social History Narrative  . None   Additional Social History:    Pain Medications: SEE MAR Prescriptions: SEE MAR Over the Counter: SEE MAR History of alcohol / drug use?: Yes Longest period of sobriety (when/how long): unknown  Negative Consequences of Use: Financial, Personal relationships Withdrawal Symptoms: Other (Comment) (No history of withdrawal symptoms)   Name of Substance 2: alcohol 2 - Age of First Use: 16 2 - Amount (size/oz): 14 12 oz beers 2 - Frequency: daily 2 - Duration: ong 2 - Last Use / Amount: 07/17/16  Sleep: Good  Appetite:  Good  Current Medications: Current Facility-Administered Medications  Medication Dose Route Frequency Provider Last Rate Last Dose  . acetaminophen (TYLENOL) tablet 650 mg  650 mg Oral Q6H PRN Jenne Campus, MD      . alum & mag hydroxide-simeth (MAALOX/MYLANTA) 200-200-20 MG/5ML suspension 30 mL  30 mL  Oral Q4H PRN Jenne Campus, MD      . hydrOXYzine (ATARAX/VISTARIL) tablet 25 mg  25 mg Oral Q6H PRN Jenne Campus, MD      . ibuprofen (ADVIL,MOTRIN) tablet 600 mg  600 mg Oral Q6H PRN Benjamine Mola, FNP   600 mg at 07/22/16 0830  . loperamide (IMODIUM) capsule 2-4 mg  2-4 mg Oral PRN Jenne Campus, MD      . LORazepam (ATIVAN) tablet 1 mg  1 mg Oral Q6H PRN Myer Peer Cobos, MD      . magnesium hydroxide (MILK OF MAGNESIA) suspension 30 mL  30 mL Oral Daily PRN Jenne Campus, MD      . multivitamin with minerals tablet 1 tablet  1 tablet Oral Daily Jenne Campus, MD   1 tablet at  07/22/16 0831  . OLANZapine (ZYPREXA) tablet 5 mg  5 mg Oral QHS Jenne Campus, MD   5 mg at 07/21/16 2124  . ondansetron (ZOFRAN-ODT) disintegrating tablet 4 mg  4 mg Oral Q6H PRN Myer Peer Cobos, MD      . potassium chloride (K-DUR,KLOR-CON) CR tablet 10 mEq  10 mEq Oral BID Jenne Campus, MD   10 mEq at 07/22/16 0831  . thiamine (VITAMIN B-1) tablet 100 mg  100 mg Oral Daily Jenne Campus, MD   100 mg at 07/22/16 0831  . traZODone (DESYREL) tablet 50 mg  50 mg Oral QHS PRN Jenne Campus, MD   50 mg at 07/21/16 2127    Lab Results:  Results for orders placed or performed during the hospital encounter of 07/20/16 (from the past 48 hour(s))  HIV antibody     Status: None   Collection Time: 07/22/16  6:09 AM  Result Value Ref Range   HIV Screen 4th Generation wRfx Non Reactive Non Reactive    Comment: (NOTE) Performed At: [facility] [place], [place] [id number] Lindon Romp MD IT:[id number] Performed at [facility], [place] [address] [place], [place] [address]   Lipid panel     Status: None   Collection Time: 07/22/16  6:09 AM  Result Value Ref Range   Cholesterol 146 0 - 200 mg/dL   Triglycerides 95 <150 mg/dL   HDL 46 >40 mg/dL   Total CHOL/HDL Ratio 3.2 RATIO   VLDL 19 0 - 40 mg/dL   LDL Cholesterol 81 0 - 99 mg/dL    Comment:        Total Cholesterol/HDL:CHD Risk Coronary Heart Disease Risk Table                     Men   Women  1/2 Average Risk   3.4   3.3  Average Risk       5.0   4.4  2 X Average Risk   9.6   7.1  3 X Average Risk  23.4   11.0        Use the calculated Patient Ratio above and the CHD Risk Table to determine the patient's CHD Risk.        ATP III CLASSIFICATION (LDL):  <100     mg/dL   Optimal  100-129  mg/dL   Near or Above                    Optimal  130-159  mg/dL   Borderline  160-189  mg/dL   High  >190  mg/dL   Very High Performed at [facility], [place] [address],  [place], [place] [address]   TSH     Status: None   Collection Time: 07/22/16  6:09 AM  Result Value Ref Range   TSH 3.879 0.350 - 4.500 uIU/mL    Comment: Performed by a 3rd Generation assay with a functional sensitivity of <=0.01 uIU/mL. Performed at [facility], [place] [address] [place], [place] [address]     Blood Alcohol level:  Lab Results  Component Value Date   ETH <5 07/17/2016   ETH <5 37/90/2409    Metabolic Disorder Labs: No results found for: HGBA1C, MPG No results found for: PROLACTIN Lab Results  Component Value Date   CHOL 146 07/22/2016   TRIG 95 07/22/2016   HDL 46 07/22/2016   CHOLHDL 3.2 07/22/2016   VLDL 19 07/22/2016   LDLCALC 81 07/22/2016    Physical Findings: AIMS: Facial and Oral Movements Muscles of Facial Expression: None, normal Lips and Perioral Area: None, normal Jaw: None, normal Tongue: None, normal,Extremity Movements Upper (arms, wrists, hands, fingers): None, normal Lower (legs, knees, ankles, toes): None, normal, Trunk Movements Neck, shoulders, hips: None, normal, Overall Severity Severity of abnormal movements (highest score from questions above): None, normal Incapacitation due to abnormal movements: None, normal Patient's awareness of abnormal movements (rate only patient's report): No Awareness, Dental Status Current problems with teeth and/or dentures?: No Does patient usually wear dentures?: No  CIWA:  CIWA-Ar Total: 0 COWS:     Musculoskeletal: Strength & Muscle Tone: within normal limits and spastic Gait & Station: normal Patient leans: N/A  Psychiatric Specialty Exam: Physical Exam  ROSdenies headache, no chest pain, no shortness of breath, no vomiting, no rash, no fever  Blood pressure 108/65, pulse 79, temperature 97.6 F (36.4 C), temperature source Oral, resp. rate 16, height '6\' 2"'  (1.88 m), weight 72.6 kg (160 lb), SpO2 99 %.Body mass index is 20.54 kg/m.  General Appearance: Fairly Groomed   Eye Contact:  Fair  Speech:  Normal Rate- improved, not pressured   Volume:  Normal  Mood:  reports he feels he is in a good mood, denies sadness or irritability  Affect:  appropriate, not irritable or expansive   Thought Process: better organized   Orientation: alert,attentive   Thought Content: denies hallucinations,no delusions, does not appear internally preoccupied   Suicidal Thoughts:  No denies any suicidal or self injurious ideations, denies any homicidal or violent ideations   Homicidal Thoughts:  No  Memory:  recent and remote grossly intact   Judgement:  Fair  Insight:  Fair  Psychomotor Activity:  Normal  Concentration:  Concentration: Good and Attention Span: Good  Recall:  Good  Fund of Knowledge:  Good  Language:  Negative  Akathisia:  Negative  Handed:  Right  AIMS (if indicated):     Assets:  Desire for Improvement Resilience  ADL's:  Intact  Cognition:  WNL  Sleep:  Number of Hours: 5.5    Assessment - patient reports improvement compared to admission , and today presents calm, pleasant, without pressured speech or irritability . He is tolerating Zyprexa well . He does continue to present with some thought disorder , which becomes noticeable when he elaborates on open ended questions. He is not presenting with any WDL symptoms. Labs reviewed. RPR pending .  Treatment Plan Summary: Daily contact with patient to assess and evaluate symptoms and progress in treatment, Medication management, Plan inpatient treatment  and  medications as below Encourage ongoing group and milieu participation to work on coping skills and symptom reduction Increase  Zyprexa to 10  mgrs QHS for mood disorder Continue Trazodone 50 mgrsd QHS PRN for insomnia  Continue VIstaril PRNs for anxiety as needed  Continue Ativan PRNs for potential alcohol WDL as needed  Treatment team working on disposition planning    Neita Garnet, MD 07/22/2016, 3:18 PM

## 2016-07-22 NOTE — Progress Notes (Signed)
[facility] Group Notes:  (Nursing/MHT/Case Management/Adjunct)  Date:  07/22/2016  Time:  12:07 AM  Type of Therapy:  Psychoeducational Skills  Participation Level:  Minimal  Participation Quality:  Resistant  Affect:  Irritable  Cognitive:  Lacking  Insight:  None  Engagement in Group:  Resistant  Modes of Intervention:  Education  Summary of Progress/Problems: The patient would only share in group that his day was "okay" and would not share any additional details. Patient states that his physical health is just fine and does not need to develop a wellness strategy (theme of the day).   Dewel Lotter S 07/22/2016, 12:07 AM

## 2016-07-22 NOTE — Progress Notes (Signed)
D:Pt rates depression as a 7 and anxiety as an 8 on 0-10 scale with 10 being the most. Pt is pleasant interacting with peers in the dayroom. Pt c/o headache that he says started happening five weeks ago and nose bleeds up to twice a day for three weeks. Pt c/o throat feeling sore this morning. He is currently talking with the MD.  A:Offered support, encouragement and 15 minute checks. Gave prn medication for a headache and sore throat.  R:Pt denies si and hi. Safety maintained on the unit.

## 2016-07-22 NOTE — Tx Team (Signed)
Interdisciplinary Treatment and Diagnostic Plan Update  07/22/2016 Time of Session: 12:35 PM  Dean Howard MRN: [id number]  Principal Diagnosis: Consider Bipolar Disorder, Mixed, Alcohol Dependence, Cannabis Dependence  Secondary Diagnoses: Active Problems:   Adjustment disorder   Current Medications:  Current Facility-Administered Medications  Medication Dose Route Frequency Provider Last Rate Last Dose  . acetaminophen (TYLENOL) tablet 650 mg  650 mg Oral Q6H PRN Craige CottaFernando A Cobos, MD      . alum & mag hydroxide-simeth (MAALOX/MYLANTA) 200-200-20 MG/5ML suspension 30 mL  30 mL Oral Q4H PRN Craige CottaFernando A Cobos, MD      . hydrOXYzine (ATARAX/VISTARIL) tablet 25 mg  25 mg Oral Q6H PRN Craige CottaFernando A Cobos, MD      . ibuprofen (ADVIL,MOTRIN) tablet 600 mg  600 mg Oral Q6H PRN Beau FannyJohn C Withrow, FNP   600 mg at 07/22/16 0830  . loperamide (IMODIUM) capsule 2-4 mg  2-4 mg Oral PRN Craige CottaFernando A Cobos, MD      . LORazepam (ATIVAN) tablet 1 mg  1 mg Oral Q6H PRN Rockey SituFernando A Cobos, MD      . magnesium hydroxide (MILK OF MAGNESIA) suspension 30 mL  30 mL Oral Daily PRN Craige CottaFernando A Cobos, MD      . multivitamin with minerals tablet 1 tablet  1 tablet Oral Daily Craige CottaFernando A Cobos, MD   1 tablet at 07/22/16 0831  . OLANZapine (ZYPREXA) tablet 5 mg  5 mg Oral QHS Craige CottaFernando A Cobos, MD   5 mg at 07/21/16 2124  . ondansetron (ZOFRAN-ODT) disintegrating tablet 4 mg  4 mg Oral Q6H PRN Rockey SituFernando A Cobos, MD      . potassium chloride (K-DUR,KLOR-CON) CR tablet 10 mEq  10 mEq Oral BID Craige CottaFernando A Cobos, MD   10 mEq at 07/22/16 0831  . thiamine (VITAMIN B-1) tablet 100 mg  100 mg Oral Daily Craige CottaFernando A Cobos, MD   100 mg at 07/22/16 0831  . traZODone (DESYREL) tablet 50 mg  50 mg Oral QHS PRN Craige CottaFernando A Cobos, MD   50 mg at 07/21/16 2127    PTA Medications: Prescriptions Prior to Admission  Medication Sig Dispense Refill Last Dose  . acetaminophen (TYLENOL) 500 MG tablet Take 500 mg by mouth every 6 (six) hours as  needed for moderate pain.    07/17/2016 at Unknown time  . carbamazepine (TEGRETOL XR) 200 MG 12 hr tablet Take 1 tablet (200 mg total) by mouth 2 (two) times daily. 60 tablet 0 07/17/2016 at Unknown time  . ibuprofen (ADVIL,MOTRIN) 200 MG tablet Take 400 mg by mouth every 6 (six) hours as needed for moderate pain.    07/16/2016 at Unknown time    Treatment Modalities: Medication Management, Group therapy, Case management,  1 to 1 session with clinician, Psychoeducation, Recreational therapy.  Patient Stressors: Financial difficulties Occupational concerns Substance abuse  Patient Strengths: DentistCommunication skills General fund of knowledge Motivation for treatment/growth Physical Health  Physician Treatment Plan for Primary Diagnosis: Consider Bipolar Disorder, Mixed, Alcohol Dependence, Cannabis Dependence Long Term Goal(s): Improvement in symptoms so as ready for discharge  Short Term Goals: Ability to verbalize feelings will improve Ability to disclose and discuss suicidal ideas Ability to demonstrate self-control will improve Ability to identify and develop effective coping behaviors will improve Ability to maintain clinical measurements within normal limits will improve Ability to identify triggers associated with substance abuse/mental health issues will improve  Medication Management: Evaluate patient's response, side effects, and tolerance of medication regimen.  Therapeutic Interventions: 1  to 1 sessions, Unit Group sessions and Medication administration.  Evaluation of Outcomes: Progressing  Physician Treatment Plan for Secondary Diagnosis: Active Problems:   Adjustment disorder   Long Term Goal(s): Improvement in symptoms so as ready for discharge  Short Term Goals: Ability to verbalize feelings will improve Ability to disclose and discuss suicidal ideas Ability to demonstrate self-control will improve Ability to identify and develop effective coping behaviors will  improve Ability to maintain clinical measurements within normal limits will improve Ability to identify triggers associated with substance abuse/mental health issues will improve  Medication Management: Evaluate patient's response, side effects, and tolerance of medication regimen.  Therapeutic Interventions: 1 to 1 sessions, Unit Group sessions and Medication administration.  Evaluation of Outcomes: Progressing   RN Treatment Plan for Primary Diagnosis: Consider Bipolar Disorder, Mixed, Alcohol Dependence, Cannabis Dependence Long Term Goal(s): Knowledge of disease and therapeutic regimen to maintain health will improve  Short Term Goals: Ability to verbalize feelings will improve, Ability to disclose and discuss suicidal ideas and Ability to identify and develop effective coping behaviors will improve  Medication Management: RN will administer medications as ordered by provider, will assess and evaluate patient's response and provide education to patient for prescribed medication. RN will report any adverse and/or side effects to prescribing provider.  Therapeutic Interventions: 1 on 1 counseling sessions, Psychoeducation, Medication administration, Evaluate responses to treatment, Monitor vital signs and CBGs as ordered, Perform/monitor CIWA, COWS, AIMS and Fall Risk screenings as ordered, Perform wound care treatments as ordered.  Evaluation of Outcomes: Progressing   LCSW Treatment Plan for Primary Diagnosis: Consider Bipolar Disorder, Mixed, Alcohol Dependence, Cannabis Dependence Long Term Goal(s): Safe transition to appropriate next level of care at discharge, Engage patient in therapeutic group addressing interpersonal concerns.  Short Term Goals: Engage patient in aftercare planning with referrals and resources, Identify triggers associated with mental health/substance abuse issues and Increase skills for wellness and recovery  Therapeutic Interventions: Assess for all discharge  needs, 1 to 1 time with Social worker, Explore available resources and support systems, Assess for adequacy in community support network, Educate family and significant other(s) on suicide prevention, Complete Psychosocial Assessment, Interpersonal group therapy.  Evaluation of Outcomes: Progressing   Progress in Treatment: Attending groups: Pt is new to milieu, continuing to assess  Participating in groups: Pt is new to milieu, continuing to assess  Taking medication as prescribed: Yes, MD continues to assess for medication changes as needed Toleration medication: Yes, no side effects reported at this time Family/Significant other contact made: No, CSW attempting to make contact with mother Patient understands diagnosis: Continuing to assess Discussing patient identified problems/goals with staff: Yes Medical problems stabilized or resolved: Yes Denies suicidal/homicidal ideation: Yes Issues/concerns per patient self-inventory: None Other: N/A  New problem(s) identified: None identified at this time.   New Short Term/Long Term Goal(s): None identified at this time.   Discharge Plan or Barriers: Pt will return home and follow-up with outpatient services.   Reason for Continuation of Hospitalization: Anxiety Depression Medication stabilization Withdrawal symptoms  Estimated Length of Stay: 3-5 days  Attendees: Patient: 07/22/2016  12:35 PM  Physician: Dr. Jama Flavors 07/22/2016  12:35 PM  Nursing: Joslyn Devon, Waynetta Sandy, RN 07/22/2016  12:35 PM  RN Care Manager: Onnie Boer, RN 07/22/2016  12:35 PM  Social Worker: Vernie Shanks, LCSW; Heather Smart, LCSW 07/22/2016  12:35 PM  Recreational Therapist:  07/22/2016  12:35 PM  Other: Armandina Stammer, NP; Gray Bernhardt, NP 07/22/2016  12:35 PM  Other:  07/22/2016  12:35 PM  Other: 07/22/2016  12:35 PM    Scribe for Treatment Team: Verdene Lennert, LCSW 07/22/2016 12:35 PM

## 2016-07-22 NOTE — Progress Notes (Signed)
Adult Psychoeducational Group Note  Date:  07/22/2016 Time:  11:29 PM  Group Topic/Focus:  Wrap-Up Group:   The focus of this group is to help patients review their daily goal of treatment and discuss progress on daily workbooks.  Participation Level:  Active  Participation Quality:  Appropriate  Affect:  Appropriate  Cognitive:  Appropriate  Insight: Good  Engagement in Group:  Engaged  Modes of Intervention:  Activity  Additional Comments:  Patient rated his day a 10 , goal is to prepare for discharge.  Claria DiceKiara M Freddi Forster 07/22/2016, 11:29 PM
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
## 2016-07-22 NOTE — Progress Notes (Signed)
  D: Pt was initially irritated, when discussing some of his peers. Stated, "they talk all day and tell all their business. They need to save something,, don't tell it all because there won't be anything to tell later". Writer reminded pt that in this setting he is supposed to tell "it all".  Pt stated he wants to be discharged so he can go back to [place]. Stated he's ready to go clubbing and dancing. Pt has no questions or concerns.    A:  Support and encouragement was offered. 15 min checks continued for safety.  R: Pt remains safe.

## 2016-07-22 NOTE — BHH Group Notes (Signed)
BHH LCSW Group Therapy 07/22/2016 1:15 PM  Type of Therapy: Group Therapy- Feelings about Diagnosis  Participation Level: Minimal  Participation Quality:  N/A  Affect:  Lethargic  Cognitive: Alert and Oriented   Insight:  Unable to assess  Engagement in Therapy: Developing/Improving and Engaged   Modes of Intervention: Clarification, Confrontation, Discussion, Education, Exploration, Limit-setting, Orientation, Problem-solving, Rapport Building, Dance movement psychotherapisteality Testing, Socialization and Support  Description of Group:   This group will allow patients to explore their thoughts and feelings about diagnoses they have received. Patients will be guided to explore their level of understanding and acceptance of these diagnoses. Facilitator will encourage patients to process their thoughts and feelings about the reactions of others to their diagnosis, and will guide patients in identifying ways to discuss their diagnosis with significant others in their lives. This group will be process-oriented, with patients participating in exploration of their own experiences as well as giving and receiving support and challenge from other group members.  Summary of Progress/Problems:  Pt slept throughout group discussion.   Therapeutic Modalities:   Cognitive Behavioral Therapy Solution Focused Therapy Motivational Interviewing Relapse Prevention Therapy  Vernie ShanksLauren Akiera Allbaugh, LCSW 07/22/2016 4:13 PM

## 2016-07-23 LAB — HEMOGLOBIN A1C
Hgb A1c MFr Bld: 5.4 % (ref 4.8–5.6)
Mean Plasma Glucose: 108 mg/dL

## 2016-07-23 LAB — RPR, QUANT+TP ABS (REFLEX)
Rapid Plasma Reagin, Quant: 1:1 {titer} — ABNORMAL HIGH
T Pallidum Abs: POSITIVE — AB

## 2016-07-23 LAB — PROLACTIN: Prolactin: 27.1 ng/mL — ABNORMAL HIGH (ref 4.0–15.2)

## 2016-07-23 LAB — RPR: RPR: REACTIVE — AB

## 2016-07-23 NOTE — BHH Group Notes (Signed)
B[facility] LCSW Group Therapy 07/23/2016 1:15 PM  Type of Therapy: Group Therapy- Emotion Regulation  Participation Level: Active   Participation Quality:  Distracting at times  Affect: Appropriate  Cognitive: Alert and Oriented   Insight:  Developing/Improving  Engagement in Therapy: Developing/Improving and Engaged   Modes of Intervention: Clarification, Confrontation, Discussion, Education, Exploration, Limit-setting, Orientation, Problem-solving, Rapport Building, Dance movement psychotherapisteality Testing, Socialization and Support  Summary of Progress/Problems: The topic for group today was emotional regulation. This group focused on both positive and negative emotion identification and allowed group members to process ways to identify feelings, regulate negative emotions, and find healthy ways to manage internal/external emotions. Group members were asked to reflect on a time when their reaction to an emotion led to a negative outcome and explored how alternative responses using emotion regulation would have benefited them. Group members were also asked to discuss a time when emotion regulation was utilized when a negative emotion was experienced. Pt identified anger as an emotion he has difficulty regulating. Pt expressed that he used alcohol as a means of self-medicating. Pt demonstrates developing insight AEB his ability to recognize that using alcohol as a coping mechanism causes more problems, however described it as effective because he just "forgets about what he was mad about" when he drinks.    Vernie ShanksLauren Mitsugi Schrader, LCSW 07/23/2016 4:17 PM

## 2016-07-23 NOTE — Progress Notes (Signed)
Recreation Therapy Notes  Date: 07/23/16 Time: 0930 Location: 300 Hall Group Room  Group Topic: Stress Management  Goal Area(s) Addresses:  Patient will verbalize importance of using healthy stress management.  Patient will identify positive emotions associated with healthy stress management.   Intervention: Stress Management  Activity :  Guided Imagery.  LRT introduced the stress management technique of guided imagery.  LRT read a script to allow patients the opportunity to engage in the activity.  Patients were to follow along as LRT read script.  Education:  Stress Management, Discharge Planning.   Education Outcome: Acknowledges edcuation/In group clarification offered/Needs additional education  Clinical Observations/Feedback: Pt did not attend group.   Dean Howard, LRT/CTRS         Dean Howard A 07/23/2016 3:09 PM 

## 2016-07-23 NOTE — Progress Notes (Signed)
Tamera St[facility]alled [facility] [phone] to ask about RPR lab result. [facility] and was informed that results should be complete tomorrow for this test.

## 2016-07-23 NOTE — Progress Notes (Signed)
D:Pt reports that he is feeling better with no headache or nosebleeds so far today. Pt is animated with interaction and talking about doing 10 push ups this morning. He says that the medication Zyprexa is helping him. Pt denies withdrawal symptoms. Per MD cancel order for withdrawal protocol.  A:Offered support, encouragement and 15 minute checks.  R:Pt denies si and hi. Safety maintained on the unit.

## 2016-07-23 NOTE — Progress Notes (Signed)
Pt reports he is doing much better today and says that he has not had a headache this evening, the first time in days.  He denies SI/HI/AVH.  He voices no needs or concerns at this time, and says the medication seems to be working for him.  He is sitting in the dayroom playing solitaire, but talking with peers at the beginning of the shift.  He continues as pleasant and cooperative with staff.  He makes his needs known to staff.  Support and encouragement offered.  Discharge plans are in process.  Pt plans to return to his mother's home.  Safety maintained with q15 minute checks.

## 2016-07-23 NOTE — Progress Notes (Signed)
[facility] MD Progress Note  07/23/2016 11:14 AM Dean Howard  MRN:  [id number] Subjective:  Patient states he is feeling better, and as he improves he is more focused on discharge planning . At this time minimizes depression, denies suicidal ideations, and is not endorsing subjective sense of irritability or anger.   Does not endorse medication side effects. Objective : I have discussed  case with treatment team and have met with patient . Patient presents calmer, less pressured in speech, with a more relaxed demeanor, not currently expansive or overtly irritable. States he is sleeping better. No disruptive or agitated behaviors on unit, going to some groups, pleasant on approach. Of note , RPR is pending , lab was contacted and report is that result will be available tomorrow. This lab was ordered due to patient's report of syphilis diagnosis in the past, report of previous symptoms suggestive of secondary syphilis such as palmar and pedal ( sole ) skin lesions which have " come and gone".  Tolerating medications well, feels Zyprexa trial is helping- no akathisia, denies excessive sedation.  Principal Problem: Bipolar Disorder, Mixed, Alcohol, Cannabis Abuse  Diagnosis:   Patient Active Problem List   Diagnosis Date Noted  . Adjustment disorder [F43.20] 07/20/2016  . MDD (major depressive disorder), recurrent, severe, with psychosis ([place]) [F33.3] 07/18/2016  . Adjustment disorder with mixed disturbance of emotions and conduct [F43.25] 06/04/2016  . 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 CHEST PAIN, PLEURITIC [R07.1] 09/16/2007   Total Time spent with patient: 20 minutes   Past Medical History:  Past Medical History:  Diagnosis Date  . Coronary artery disease   . MI, old   . Tick fever     Past Surgical History:  Procedure Laterality Date  . HERNIA REPAIR    . KNEE SURGERY     Family History: History reviewed. No pertinent family history.  Social  History:  History  Alcohol Use  . Yes    Comment: about 14 beers per day     History  Drug Use  . Types: Marijuana    Social History   Social History  . Marital status: Single    Spouse name: N/A  . Number of children: N/A  . Years of education: N/A   Social History Main Topics  . Smoking status: Current Every Day Smoker    Packs/day: 0.25    Types: Cigarettes  . Smokeless tobacco: Never Used  . Alcohol use Yes     Comment: about 14 beers per day  . Drug use: Yes    Types: Marijuana  . Sexual activity: Not Asked   Other Topics Concern  . None   Social History Narrative  . None   Additional Social History:    Pain Medications: Howard MAR Prescriptions: Howard MAR Over the Counter: Howard MAR History of alcohol / drug use?: Yes Longest period of sobriety (when/how long): unknown  Negative Consequences of Use: Financial, Personal relationships Withdrawal Symptoms: Other (Comment) (No history of withdrawal symptoms)   Name of Substance 2: alcohol 2 - Age of First Use: 16 2 - Amount (size/oz): 14 12 oz beers 2 - Frequency: daily 2 - Duration: ong 2 - Last Use / Amount: 07/17/16  Sleep: Good  Appetite:  Good  Current Medications: Current Facility-Administered Medications  Medication Dose Route Frequency Provider Last Rate Last Dose  . acetaminophen (TYLENOL) tablet 650 mg  650 mg Oral Q6H PRN Jenne Campus, MD      .  alum & mag hydroxide-simeth (MAALOX/MYLANTA) 200-200-20 MG/5ML suspension 30 mL  30 mL Oral Q4H PRN Jenne Campus, MD      . hydrOXYzine (ATARAX/VISTARIL) tablet 25 mg  25 mg Oral Q6H PRN Jenne Campus, MD      . ibuprofen (ADVIL,MOTRIN) tablet 600 mg  600 mg Oral Q6H PRN Benjamine Mola, FNP   600 mg at 07/22/16 2116  . loperamide (IMODIUM) capsule 2-4 mg  2-4 mg Oral PRN Jenne Campus, MD      . LORazepam (ATIVAN) tablet 1 mg  1 mg Oral Q6H PRN Myer Peer Romond Pipkins, MD      . magnesium hydroxide (MILK OF MAGNESIA) suspension 30 mL  30 mL Oral  Daily PRN Jenne Campus, MD      . multivitamin with minerals tablet 1 tablet  1 tablet Oral Daily Jenne Campus, MD   1 tablet at 07/23/16 [phone]  . OLANZapine (ZYPREXA) tablet 10 mg  10 mg Oral QHS Jenne Campus, MD   10 mg at 07/22/16 2116  . ondansetron (ZOFRAN-ODT) disintegrating tablet 4 mg  4 mg Oral Q6H PRN Jenne Campus, MD      . thiamine (VITAMIN B-1) tablet 100 mg  100 mg Oral Daily Jenne Campus, MD   100 mg at 07/23/16 0753  . traZODone (DESYREL) tablet 50 mg  50 mg Oral QHS PRN Jenne Campus, MD   50 mg at 07/22/16 2116    Lab Results:  Results for orders placed or performed during the hospital encounter of 07/20/16 (from the past 48 hour(s))  HIV antibody     Status: None   Collection Time: 07/22/16  6:09 AM  Result Value Ref Range   HIV Screen 4th Generation wRfx Non Reactive Non Reactive    Comment: (NOTE) Performed At: [facility] [place], [place] [id number] Lindon Romp MD [id number] Performed at [facility], [place] [address], [place], [place] [address]   Lipid panel     Status: None   Collection Time: 07/22/16  6:09 AM  Result Value Ref Range   Cholesterol 146 0 - 200 mg/dL   Triglycerides 95 <150 mg/dL   HDL 46 >40 mg/dL   Total CHOL/HDL Ratio 3.2 RATIO   VLDL 19 0 - 40 mg/dL   LDL Cholesterol 81 0 - 99 mg/dL    Comment:        Total Cholesterol/HDL:CHD Risk Coronary Heart Disease Risk Table                     Men   Women  1/2 Average Risk   3.4   3.3  Average Risk       5.0   4.4  2 X Average Risk   9.6   7.1  3 X Average Risk  23.4   11.0        Use the calculated Patient Ratio above and the CHD Risk Table to determine the patient's CHD Risk.        ATP III CLASSIFICATION (LDL):  <100     mg/dL   Optimal  100-129  mg/dL   Near or Above                    Optimal  130-159  mg/dL   Borderline  160-189  mg/dL   High  >190     mg/dL   Very High Performed at [facility]  Lab,  [address], [place], [place] [address]   Hemoglobin A1c     Status: None   Collection Time: 07/22/16  6:09 AM  Result Value Ref Range   Hgb A1c MFr Bld 5.4 4.8 - 5.6 %    Comment: (NOTE)         Pre-diabetes: 5.7 - 6.4         Diabetes: >6.4         Glycemic control for adults with diabetes: <7.0    Mean Plasma Glucose 108 mg/dL    Comment: (NOTE) Performed At: [facility] [place], [place] [id number] Lindon Romp MD [id number] Performed at [facility], [place] [address] [place], [place] [address]   Prolactin     Status: Abnormal   Collection Time: 07/22/16  6:09 AM  Result Value Ref Range   Prolactin 27.1 (H) 4.0 - 15.2 ng/mL    Comment: (NOTE) Performed At: [facility] [place], [place] [id number] Lindon Romp MD NU:[id number] Performed at [facility] [place] [address], [place], [place] [address]   TSH     Status: None   Collection Time: 07/22/16  6:09 AM  Result Value Ref Range   TSH 3.879 0.350 - 4.500 uIU/mL    Comment: Performed by a 3rd Generation assay with a functional sensitivity of <=0.01 uIU/mL. Performed at [facility], [place] [address] [place], [place] [address]     Blood Alcohol level:  Lab Results  Component Value Date   [facility] <5 07/17/2016   ETH <5 87/56/4332    Metabolic Disorder Labs: Lab Results  Component Value Date   HGBA1C 5.4 07/22/2016   MPG 108 07/22/2016   Lab Results  Component Value Date   PROLACTIN 27.1 (H) 07/22/2016   Lab Results  Component Value Date   CHOL 146 07/22/2016   TRIG 95 07/22/2016   HDL 46 07/22/2016   CHOLHDL 3.2 07/22/2016   VLDL 19 07/22/2016   LDLCALC 81 07/22/2016    Physical Findings: AIMS: Facial and Oral Movements Muscles of Facial Expression: None, normal Lips and Perioral Area: None, normal Jaw: None, normal Tongue: None, normal,Extremity Movements Upper (arms, wrists, hands,  fingers): None, normal Lower (legs, knees, ankles, toes): None, normal, Trunk Movements Neck, shoulders, hips: None, normal, Overall Severity Severity of abnormal movements (highest score from questions above): None, normal Incapacitation due to abnormal movements: None, normal Patient's awareness of abnormal movements (rate only patient's report): No Awareness, Dental Status Current problems with teeth and/or dentures?: No Does patient usually wear dentures?: No  CIWA:  CIWA-Ar Total: 0 COWS:     Musculoskeletal: Strength & Muscle Tone: within normal limits and spastic Gait & Station: normal Patient leans: N/A  Psychiatric Specialty Exam: Physical Exam  ROSdenies headache, no chest pain, no shortness of breath, no vomiting, no rash, no fever  Blood pressure 116/73, pulse (!) 104, temperature 97.8 F (36.6 C), temperature source Oral, resp. rate 18, height '6\' 2"'  (1.88 m), weight 72.6 kg (160 lb), SpO2 99 %.Body mass index is 20.54 kg/m.  General Appearance:  partially improved   Eye Contact: improving   Speech:  Normal Rate  Volume:  Normal  Mood:  Mood identified as improved   Affect:  More reactive, not irritable today  Thought Process: better organized   Orientation: alert,attentive   Thought Content: denies hallucinations,no delusions, does not appear internally preoccupied   Suicidal Thoughts:  No denies any suicidal  or self injurious ideations, denies any homicidal or violent ideations   Homicidal Thoughts:  No  Memory:  recent and remote grossly intact   Judgement: improving   Insight:  Fair  Psychomotor Activity:  Normal  Concentration:  Concentration: Good and Attention Span: Good  Recall:  Good  Fund of Knowledge:  Good  Language:  Negative  Akathisia:  Negative  Handed:  Right  AIMS (if indicated):     Assets:  Desire for Improvement Resilience  ADL's:  Intact  Cognition:  WNL  Sleep:  Number of Hours: 6.5    Assessment - patient continues to present with   improvement - at this time no irritability or affective expansiveness noted. He is on Zyprexa, has tolerated titration well  , without reported or noted side effects at this time. No akathisia noted.  RPR pending- Howard above  .  Treatment Plan Summary: Daily contact with patient to assess and evaluate symptoms and progress in treatment, Medication management, Plan inpatient treatment  and medications as below Encourage ongoing group and milieu participation to work on coping skills and symptom reduction Continue  Zyprexa  10  mgrs QHS for mood disorder Continue Trazodone 50 mgrs QHS PRN for insomnia  Continue VIstaril PRNs for anxiety as needed  Continue Ativan PRNs for potential alcohol WDL as needed  Treatment team working on disposition planning     Neita Garnet, MD 07/23/2016, 11:14 AM   Patient ID: Dean Howard, male   DOB: 04/01/1966, 51 y.o.   MRN: [id number]

## 2016-07-23 NOTE — Progress Notes (Signed)
Pt has been in the dayroom all evening.  He attended evening group.  He reports his day has been good and hopes to discharge soon.  He feels he is doing well on the Zyprexa and is aware it has been increased tonight.  He has voiced no needs or concerns.  He denies SI/HI/AVH.  He makes his needs known to staff.  He is pleasant and cooperative with staff and peers.  Support and encouragement offered.  Discharge plans are in process.  Safety maintained with q15 minute checks.

## 2016-07-24 DIAGNOSIS — F3162 Bipolar disorder, current episode mixed, moderate: Secondary | ICD-10-CM

## 2016-07-24 MED ORDER — OLANZAPINE 10 MG PO TABS: 10.0000 mg | ORAL_TABLET | Freq: Every day | ORAL | 0 refills | Status: DC

## 2016-07-24 MED ORDER — THIAMINE HCL 100 MG PO TABS: 100.0000 mg | ORAL_TABLET | Freq: Every day | ORAL | 0 refills | Status: DC

## 2016-07-24 MED ORDER — HYDROXYZINE HCL 25 MG PO TABS: 25.0000 mg | ORAL_TABLET | Freq: Four times a day (QID) | ORAL | 0 refills | Status: DC | PRN

## 2016-07-24 MED ORDER — HYDROXYZINE HCL 25 MG PO TABS
25.0000 mg | ORAL_TABLET | Freq: Four times a day (QID) | ORAL | Status: DC | PRN
  Filled 2016-07-24: qty 10

## 2016-07-24 MED ORDER — TRAZODONE HCL 50 MG PO TABS: 50.0000 mg | ORAL_TABLET | Freq: Every evening | ORAL | 0 refills | Status: DC | PRN

## 2016-07-24 NOTE — Progress Notes (Signed)
  [facility] :  Will you be returning to the same living situation after discharge:  Yes,  Pt returning home with family At discharge, do you have transportation home?: Yes,  Pt provided with bus pass Do you have the ability to pay for your medications: Yes,  Pt provided with samples and prescriptions  Release of information consent forms completed and in the chart;  Patient's signature needed at discharge.  Patient to Follow up at: Follow-up Information    [facility] Follow up on 07/28/2016.   Specialty:  Behavioral Health Why:  Monday at 8:15 to see the psychaitrist. Contact information: [address] [place] [address] [phone]           Next level of care provider has access to [facility] Link:no  Safety Planning and Suicide Prevention discussed: Yes,  with mother; see SPE note  Have you used any form of tobacco in the last 30 days? (Cigarettes, Smokeless Tobacco, Cigars, and/or Pipes): Yes  Has patient been referred to the Quitline?: Patient refused referral  Patient has been referred for addiction treatment: Yes  Verdene LennertLauren C Johniece Hornbaker 07/24/2016, 11:08 AM

## 2016-07-24 NOTE — BHH Suicide Risk Assessment (Addendum)
[facility] Discharge Suicide Risk Assessment   Principal Problem:  Mood Disorder  Discharge Diagnoses:  Patient Active Problem List   Diagnosis Date Noted  . Adjustment disorder [F43.20] 07/20/2016  . MDD (major depressive disorder), recurrent, severe, with psychosis (HCC) [F33.3] 07/18/2016  . Adjustment disorder with mixed disturbance of emotions and conduct [F43.25] 06/04/2016  . 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 CHEST PAIN, PLEURITIC [R07.1] 09/16/2007    Total Time spent with patient: 30 minutes  Musculoskeletal: Strength & Muscle Tone: within normal limits Gait & Station: normal Patient leans: N/A  Psychiatric Specialty Exam: ROS denies headache, denies chest pain, no shortness of breath, no nausea, no vomiting, no fever, no chills  Blood pressure 109/75, pulse (!) 105, temperature 98.4 F (36.9 C), temperature source Oral, resp. rate 16, height 6\' 2"  (1.88 m), weight 72.6 kg (160 lb), SpO2 99 %.Body mass index is 20.54 kg/m.  General Appearance: improved grooming  Eye Contact::  Good  Speech:  Normal Rate409  Volume:  Normal  Mood:  improved, currently euthymic   Affect:  Appropriate  Thought Process:  Linear  Orientation:  Full (Time, Place, and Person)  Thought Content:  denies hallucinations, no delusions, not internally preoccupied   Suicidal Thoughts:  No denies any suicidal ideations, no homicidal ideations  Homicidal Thoughts:  No  Memory:  recent and remote grossly intact   Judgement:  Other:  improving   Insight:  improving   Psychomotor Activity:  Normal  Concentration:  Good  Recall:  Good  Fund of Knowledge:Good  Language: Good  Akathisia:  Negative  Handed:  Right  AIMS (if indicated):     Assets:  Desire for Improvement Resilience  Sleep:  Number of Hours: 6  Cognition: WNL  ADL's:  Intact   Mental Status Per Nursing Assessment::   On Admission:  NA  Demographic Factors:  51 year old single male, unemployed,  lives with mother    Loss Factors: Unemployment, limited support network  Historical Factors: Bipolar Disorder, Alcohol Abuse   Risk Reduction Factors:   Positive coping skills or problem solving skills  Continued Clinical Symptoms:  At this time patient is improved compared to admission- presents alert, attentive, well related, calm, mood is improved, affect is appropriate , reactive, not expansive or irritable at this time, no thought disorder, no suicidal or self injurious ideations, no homicidal or violent ideations, no hallucinations, no delusions . Denies medication side effects. Behavior on unit in good control. Case/ RPR results/titers ( low positive )  reviewed with Infectious Disease Consultant - recommendation is to refer to ID outpatient clinic for ongoing monitoring and treatment .  Cognitive Features That Contribute To Risk:  No gross cognitive deficits noted upon discharge. Is alert , attentive, and oriented x 3    Suicide Risk:  Mild:  Suicidal ideation of limited frequency, intensity, duration, and specificity.  There are no identifiable plans, no associated intent, mild dysphoria and related symptoms, good self-control (both objective and subjective assessment), few other risk factors, and identifiable protective factors, including available and accessible social support.  Follow-up Information    [facility] Follow up on 07/28/2016.   Specialty:  Behavioral Health Why:  Monday at 8:15 to see the psychaitrist. Contact information: [address] [place] [address] [phone]           Plan Of Care/Follow-up recommendations:  Activity:  as tolerated  Diet:  Regular Tests:  Na Other:  See below  Patient  is leaving unit in good spirits  Plans to follow up as above  [facility] for ongoing treatment - see above  Encouraged to abstain from alcohol completely, avoid people, places and things  Nehemiah MassedOBOS, Dymon Summerhill, MD 07/24/2016,  9:55 AM

## 2016-07-24 NOTE — Discharge Summary (Signed)
Physician Discharge Summary Note  Patient:  Dean Howard is an 51 y.o., male MRN:  [id number] DOB:  12/27/1965 Patient phone:  [phone] (home)  Patient address:   [address] [place] [id number],  Total Time spent with patient: 30 minutes  Date of Admission:  07/20/2016 Date of Discharge: 07/24/2016  Reason for Admission:  ETOH abuse, Depressed and nervous  Principal Problem: Adjustment disorder Discharge Diagnoses: Patient Active Problem List   Diagnosis Date Noted  . Adjustment disorder [F43.20] 07/20/2016    Priority: High  . MDD (major depressive disorder), recurrent, severe, with psychosis (HCC) [F33.3] 07/18/2016  . Adjustment disorder with mixed disturbance of emotions and conduct [F43.25] 06/04/2016  . 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 CHEST PAIN, PLEURITIC [R07.1] 09/16/2007    Past Psychiatric History: see HPI  Past Medical History:  Past Medical History:  Diagnosis Date  . Coronary artery disease   . MI, old   . Tick fever     Past Surgical History:  Procedure Laterality Date  . HERNIA REPAIR    . KNEE SURGERY     Family History: History reviewed. No pertinent family history. Family Psychiatric  History: see HPI Social History:  History  Alcohol Use  . Yes    Comment: about 14 beers per day     History  Drug Use  . Types: Marijuana    Social History   Social History  . Marital status: Single    Spouse name: N/A  . Number of children: N/A  . Years of education: N/A   Social History Main Topics  . Smoking status: Current Every Day Smoker    Packs/day: 0.25    Types: Cigarettes  . Smokeless tobacco: Never Used  . Alcohol use Yes     Comment: about 14 beers per day  . Drug use: Yes    Types: Marijuana  . Sexual activity: Not Asked   Other Topics Concern  . None   Social History Narrative  . None    Hospital Course:  Dean Howard, 51 yo male was admitted for crisis management.   He reported upon admission that his depression was worsening, anxious and irritable often.    Dean Howard was admitted for Adjustment disorder and crisis management.  Patient was treated with medications with their indications listed below in detail under Medication List.  Medical problems were identified and treated as needed.  Home medications were restarted as appropriate.  Improvement was monitored by observation and Dean Howard daily report of symptom reduction.  Emotional and mental status was monitored by daily self inventory reports completed by Dean Howard and clinical staff.  Patient reported continued improvement, denied any new concerns.  Patient had been compliant on medications and denied side effects.  Support and encouragement was provided.    Patient encouraged to attend groups to help with recognizing triggers of emotional crises and de-stabilizations.  Patient encouraged to attend group to help identify the positive things in life that would help in dealing with feelings of loss, depression and unhealthy or abusive tendencies.         Dean Howard was evaluated by the treatment team for stability and plans for continued recovery upon discharge.  Patient was offered further treatment options upon discharge including Residential, Intensive Outpatient and Outpatient treatment. Patient will follow up with agency listed below for medication management and counseling.  Encouraged patient to maintain satisfactory support network and home environment.  Advised to adhere to medication compliance and outpatient treatment follow up.  Prescriptions provided.       Dean Howard was an integral factor for scheduling further treatment.  Employment, transportation, bed availability, health status, family support, and any pending legal issues were also considered during patient's hospital stay.  Upon completion of this admission the patient was both mentally  and medically stable for discharge denying suicidal/homicidal ideation, auditory/visual/tactile hallucinations, delusional thoughts and paranoia.      Physical Findings: AIMS: Facial and Oral Movements Muscles of Facial Expression: None, normal Lips and Perioral Area: None, normal Jaw: None, normal Tongue: None, normal,Extremity Movements Upper (arms, wrists, hands, fingers): None, normal Lower (legs, knees, ankles, toes): None, normal, Trunk Movements Neck, shoulders, hips: None, normal, Overall Severity Severity of abnormal movements (highest score from questions above): None, normal Incapacitation due to abnormal movements: None, normal Patient's awareness of abnormal movements (rate only patient's report): No Awareness, Dental Status Current problems with teeth and/or dentures?: No Does patient usually wear dentures?: No  CIWA:  CIWA-Ar Total: 0 COWS:     Musculoskeletal: Strength & Muscle Tone: within normal limits Gait & Station: normal Patient leans: N/A  Psychiatric Specialty Exam:  See MD SRA Physical Exam  Nursing note and vitals reviewed.   ROS  Blood pressure 109/75, pulse (!) 105, temperature 98.4 F (36.9 C), temperature source Oral, resp. rate 16, height 6\' 2"  (1.88 m), weight 72.6 kg (160 lb), SpO2 99 %.Body mass index is 20.54 kg/m.    Have you used any form of tobacco in the last 30 days? (Cigarettes, Smokeless Tobacco, Cigars, and/or Pipes): Yes  Has this patient used any form of tobacco in the last 30 days? (Cigarettes, Smokeless Tobacco, Cigars, and/or Pipes) Yes, N/A  Blood Alcohol level:  Lab Results  Component Value Date   [facility] <5 07/17/2016   ETH <5 06/03/2016    Metabolic Disorder Labs:  Lab Results  Component Value Date   HGBA1C 5.4 07/22/2016   MPG 108 07/22/2016   Lab Results  Component Value Date   PROLACTIN 27.1 (H) 07/22/2016   Lab Results  Component Value Date   CHOL 146 07/22/2016   TRIG 95 07/22/2016   HDL 46 07/22/2016    CHOLHDL 3.2 07/22/2016   VLDL 19 07/22/2016   LDLCALC 81 07/22/2016    See Psychiatric Specialty Exam and Suicide Risk Assessment completed by Attending Physician prior to discharge.  Discharge destination:  Home  Is patient on multiple antipsychotic therapies at discharge:  No   Has Patient had three or more failed trials of antipsychotic monotherapy by history:  No  Recommended Plan for Multiple Antipsychotic Therapies: NA   Allergies as of 07/24/2016   No Known Allergies     Medication List    STOP taking these medications   acetaminophen 500 MG tablet Commonly known as:  TYLENOL   carbamazepine 200 MG 12 hr tablet Commonly known as:  TEGRETOL XR   ibuprofen 200 MG tablet Commonly known as:  ADVIL,MOTRIN     TAKE these medications     Indication  hydrOXYzine 25 MG tablet Commonly known as:  ATARAX/VISTARIL Take 1 tablet (25 mg total) by mouth every 6 (six) hours as needed (anxiety/agitation or CIWA < or = 10).  Indication:  Anxiety Neurosis   OLANZapine 10 MG tablet Commonly known as:  ZYPREXA Take 1 tablet (10 mg total) by mouth at bedtime.  Indication:  mood stabilization   thiamine 100 MG tablet Take 1  tablet (100 mg total) by mouth daily. Start taking on:  07/25/2016  Indication:  Deficiency in Thiamine or Vitamin B1   traZODone 50 MG tablet Commonly known as:  DESYREL Take 1 tablet (50 mg total) by mouth at bedtime as needed for sleep.  Indication:  Trouble Sleeping      Follow-up Information    [facility] Follow up on 07/28/2016.   Specialty:  Behavioral Health Why:  Monday at 8:15 to see the psychaitrist. Contact information: [address] [place] [address] [phone]           Follow-up recommendations:  Activity:  as tol Diet:  as tol  Comments:  1.  Take all your medications as prescribed.   2.  Report any adverse side effects to outpatient provider. 3.  Patient instructed to not use alcohol or illegal drugs while on prescription  medicines. 4.  In the event of worsening symptoms, instructed patient to call 911, the crisis hotline or go to nearest emergency room for evaluation of symptoms. 5.  [facility] to follow up with positive RPR results.  Please call for an appt.  Signed: Lindwood Qua, NP [facility] 07/24/2016, 10:24 AM   Patient seen, Suicide Assessment Completed.  Disposition Plan Reviewed

## 2016-07-24 NOTE — [facility] Suicide Risk Assessment (Signed)
[facility] INPATIENT:  Family/Significant Other Suicide Prevention Education  Suicide Prevention Education:  Education Completed; Geryl RankinsLurie Haney, Pt's mother [phone],  (name of family member/significant other) has been identified by the patient as the family member/significant other with whom the patient will be residing, and identified as the person(s) who will aid the patient in the event of a mental health crisis (suicidal ideations/suicide attempt).  With written consent from the patient, the family member/significant other has been provided the following suicide prevention education, prior to the and/or following the discharge of the patient.  The suicide prevention education provided includes the following:  Suicide risk factors  Suicide prevention and interventions  National Suicide Hotline telephone number  [facility] assessment telephone number  [facility] Emergency Assistance 911  [facility] and/or Residential Mobile Crisis Unit telephone number  Request made of family/significant other to:  Remove weapons (e.g., guns, rifles, knives), all items previously/currently identified as safety concern.    Remove drugs/medications (over-the-counter, prescriptions, illicit drugs), all items previously/currently identified as a safety concern.  The family member/significant other verbalizes understanding of the suicide prevention education information provided.  The family member/significant other agrees to remove the items of safety concern listed above.  Verdene LennertLauren C Shyann Hefner 07/24/2016, 11:07 AM

## 2016-07-24 NOTE — Discharge Instructions (Signed)
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  [facility]

## 2016-07-24 NOTE — Progress Notes (Signed)
Pt discharged home on a bus pass. Pt was ambulatory stable and appreciative at that time. All papers and prescriptions were given and valuables returned. Verbal understanding expressed. Denies SI/HI and A/VH. Pt given opportunity to express concerns and ask questions.

## 2016-08-27 ENCOUNTER — Encounter (HOSPITAL_COMMUNITY): Payer: Self-pay

## 2016-08-27 ENCOUNTER — Emergency Department (HOSPITAL_COMMUNITY)
Admission: EM | Admit: 2016-08-27 | Discharge: 2016-08-28 | Disposition: A | Discharge status: Home / Self Care | Payer: Federal, State, Local not specified - Other | Attending: Emergency Medicine | Admitting: Emergency Medicine

## 2016-08-27 DIAGNOSIS — Z79899 Other long term (current) drug therapy: Secondary | ICD-10-CM | POA: Insufficient documentation

## 2016-08-27 DIAGNOSIS — R45850 Homicidal ideations: Secondary | ICD-10-CM

## 2016-08-27 DIAGNOSIS — I252 Old myocardial infarction: Secondary | ICD-10-CM | POA: Insufficient documentation

## 2016-08-27 DIAGNOSIS — I2510 Atherosclerotic heart disease of native coronary artery without angina pectoris: Secondary | ICD-10-CM | POA: Insufficient documentation

## 2016-08-27 DIAGNOSIS — F4325 Adjustment disorder with mixed disturbance of emotions and conduct: Secondary | ICD-10-CM | POA: Diagnosis present

## 2016-08-27 DIAGNOSIS — F17210 Nicotine dependence, cigarettes, uncomplicated: Secondary | ICD-10-CM | POA: Insufficient documentation

## 2016-08-27 LAB — SALICYLATE LEVEL

## 2016-08-27 LAB — ETHANOL

## 2016-08-27 LAB — CBC
HCT: 38.7 % — ABNORMAL LOW (ref 39.0–52.0)
Hemoglobin: 13.1 g/dL (ref 13.0–17.0)
MCH: 29.6 pg (ref 26.0–34.0)
MCHC: 33.9 g/dL (ref 30.0–36.0)
MCV: 87.4 fL (ref 78.0–100.0)
PLATELETS: 222 10*3/uL (ref 150–400)
RBC: 4.43 MIL/uL (ref 4.22–5.81)
RDW: 13.8 % (ref 11.5–15.5)
WBC: 4.3 10*3/uL (ref 4.0–10.5)

## 2016-08-27 LAB — COMPREHENSIVE METABOLIC PANEL
ALT: 18 U/L (ref 17–63)
AST: 31 U/L (ref 15–41)
Albumin: 4 g/dL (ref 3.5–5.0)
Alkaline Phosphatase: 106 U/L (ref 38–126)
Anion gap: 7 (ref 5–15)
BILIRUBIN TOTAL: 0.8 mg/dL (ref 0.3–1.2)
BUN: 16 mg/dL (ref 6–20)
CHLORIDE: 101 mmol/L (ref 101–111)
CO2: 30 mmol/L (ref 22–32)
CREATININE: 1.51 mg/dL — AB (ref 0.61–1.24)
Calcium: 9.1 mg/dL (ref 8.9–10.3)
GFR calc non Af Amer: 53 mL/min — ABNORMAL LOW (ref >60)
Glucose, Bld: 90 mg/dL (ref 65–99)
POTASSIUM: 4.2 mmol/L (ref 3.5–5.1)
Sodium: 138 mmol/L (ref 135–145)
TOTAL PROTEIN: 7.4 g/dL (ref 6.5–8.1)

## 2016-08-27 LAB — RAPID URINE DRUG SCREEN, HOSP PERFORMED
AMPHETAMINES: NOT DETECTED
Barbiturates: NOT DETECTED
Benzodiazepines: NOT DETECTED
Cocaine: NOT DETECTED
OPIATES: NOT DETECTED
Tetrahydrocannabinol: POSITIVE — AB

## 2016-08-27 LAB — ACETAMINOPHEN LEVEL: Acetaminophen (Tylenol), Serum: <10 ug/mL — ABNORMAL LOW (ref 10–30)

## 2016-08-27 MED ORDER — VITAMIN B-1 100 MG PO TABS
100.0000 mg | ORAL_TABLET | Freq: Every day | ORAL | Status: DC
  Administered 2016-08-28: 100 mg via ORAL
  Filled 2016-08-27: qty 1

## 2016-08-27 MED ORDER — OLANZAPINE 10 MG PO TABS
10.0000 mg | ORAL_TABLET | Freq: Every day | ORAL | Status: DC
Start: 2016-08-27 — End: 2016-08-28
  Administered 2016-08-28: 10 mg via ORAL
  Filled 2016-08-27: qty 1

## 2016-08-27 MED ORDER — ACETAMINOPHEN 325 MG PO TABS
650.0000 mg | ORAL_TABLET | Freq: Once | ORAL | Status: AC
  Administered 2016-08-27: 650 mg via ORAL
  Filled 2016-08-27: qty 2

## 2016-08-27 MED ORDER — TRAZODONE HCL 50 MG PO TABS
50.0000 mg | ORAL_TABLET | Freq: Every evening | ORAL | Status: DC | PRN
  Administered 2016-08-28: 50 mg via ORAL
  Filled 2016-08-27: qty 1

## 2016-08-27 MED ORDER — PAROXETINE HCL 20 MG PO TABS
20.0000 mg | ORAL_TABLET | Freq: Every day | ORAL | Status: DC
  Administered 2016-08-28: 20 mg via ORAL
  Filled 2016-08-27: qty 1

## 2016-08-27 MED ORDER — QUETIAPINE FUMARATE 25 MG PO TABS
25.0000 mg | ORAL_TABLET | Freq: Two times a day (BID) | ORAL | Status: DC
Start: 2016-08-27 — End: 2016-08-28
  Administered 2016-08-28 (×2): 25 mg via ORAL
  Filled 2016-08-27 (×2): qty 1

## 2016-08-27 MED ORDER — HYDROXYZINE HCL 25 MG PO TABS: 25.0000 mg | ORAL_TABLET | Freq: Four times a day (QID) | ORAL | Status: DC | PRN

## 2016-08-27 MED ORDER — QUETIAPINE FUMARATE 100 MG PO TABS
200.0000 mg | ORAL_TABLET | Freq: Every day | ORAL | Status: DC
Start: 2016-08-27 — End: 2016-08-28
  Administered 2016-08-28: 200 mg via ORAL
  Filled 2016-08-27: qty 2

## 2016-08-27 NOTE — ED Notes (Signed)
Pt ambulatory w/o difficulty from TCU to room 36

## 2016-08-27 NOTE — ED Notes (Signed)
Pt sleeping at present, no distress noted, easily arouseable to verbal stimuli.  Calm & cooperative at present.  Monitoring for safety, Q 15 min checks in effect.
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
## 2016-08-27 NOTE — ED Notes (Signed)
tts into see 

## 2016-08-27 NOTE — ED Triage Notes (Signed)
Per [facility]  Unable to be treated there d/t heart condition.  Pt was there for Suicidal thoughts. Per family, history of same.
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
## 2016-08-27 NOTE — ED Notes (Signed)
Pt reports that he does not use a machine at night, but has occasionally used his mothers machine.  Pt reports that he has not been diagnosed with sleep apnea.

## 2016-08-27 NOTE — ED Notes (Signed)
Phar tech into see 

## 2016-08-28 DIAGNOSIS — F1290 Cannabis use, unspecified, uncomplicated: Secondary | ICD-10-CM

## 2016-08-28 DIAGNOSIS — F4325 Adjustment disorder with mixed disturbance of emotions and conduct: Secondary | ICD-10-CM

## 2016-08-28 DIAGNOSIS — F17210 Nicotine dependence, cigarettes, uncomplicated: Secondary | ICD-10-CM

## 2016-08-28 DIAGNOSIS — Z79899 Other long term (current) drug therapy: Secondary | ICD-10-CM | POA: Diagnosis not present

## 2016-08-28 NOTE — Consult Note (Signed)
[place] Psychiatry Consult   Reason for Consult:  Depression with suicidal ideations Referring Physician:  EDP Patient Identification: Dean Howard MRN:  [id number] Principal Diagnosis: Adjustment disorder with mixed disturbance of emotions and conduct Diagnosis:   Patient Active Problem List   Diagnosis Date Noted  . Adjustment disorder with mixed disturbance of emotions and conduct [F43.25] 06/04/2016    Priority: High  . Bipolar 1 disorder, mixed, moderate ([place]) [F31.62]   . Adjustment disorder [F43.20] 07/20/2016  . MDD (major depressive disorder), recurrent, severe, with psychosis ([place]) [F33.3] 07/18/2016  . 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 CHEST PAIN, PLEURITIC [R07.1] 09/16/2007    Total Time spent with patient: 45 minutes  Subjective:   Dean Howard is a 51 y.o. male patient does not warrant admission  HPI:  51 yo male who went to [facility] for suicidal ideations and was kept overnight but then sent to the ED for past medical history.  Today, he denies suicidal/homicidal ideations, hallucinations, and alcohol/drug abuse.  He is upset that people are taking advantage of his mother and he feels he does everything but is the one who gets "put out."  He appears more frustrated than depressed, encouraged to go to [place].  Stable for discharge.  Past Psychiatric History: depression, bipolar disorder  Risk to Self: Suicidal Ideation: denies Suicidal Intent: No Is patient at risk for suicide?: No Suicidal Plan: denies Specify Current Suicidal Plan:none Access to Means: denies Specify Access to Suicidal Means: has rx medications What has been your use of drugs/alcohol within the last 12 months?: see above How many times?: 3 Triggers for Past Attempts: Unknown Intentional Self Injurious Behavior: None Risk to Others: Homicidal Ideation: No Thoughts of Harm to Others: Yes-Currently Present Comment - Thoughts of Harm to  Others: wanted to wrap a coat hanger around his hand and choke his friend Current Homicidal Intent: No Current Homicidal Plan: No Access to Homicidal Means: No History of harm to others?: Yes Assessment of Violence: None Noted Does patient have access to weapons?: No Criminal Charges Pending?: No Does patient have a court date: No Prior Inpatient Therapy: Prior Inpatient Therapy: Yes Prior Therapy Dates: 07/20/16-07/24/16 Prior Therapy Facilty/Provider(s): [facility] Reason for Treatment: med adjustment Prior Outpatient Therapy: Prior Outpatient Therapy: No Does patient have an ACCT team?: No Does patient have Intensive In-House Services?  : No Does patient have [facility] services? : Yes Does patient have P4CC services?: No  Past Medical History:  Past Medical History:  Diagnosis Date  . Coronary artery disease   . MI, old   . Tick fever     Past Surgical History:  Procedure Laterality Date  . HERNIA REPAIR    . KNEE SURGERY     Family History: History reviewed. No pertinent family history. Family Psychiatric  History: none Social History:  History  Alcohol Use  . Yes    Comment: about 14 beers per day     History  Drug Use  . Types: Marijuana    Social History   Social History  . Marital status: Single    Spouse name: N/A  . Number of children: N/A  . Years of education: N/A   Social History Main Topics  . Smoking status: Current Every Day Smoker    Packs/day: 0.25    Types: Cigarettes  . Smokeless tobacco: Never Used  . Alcohol use Yes     Comment: about 14 beers per day  . Drug use:  Yes    Types: Marijuana  . Sexual activity: Not Asked   Other Topics Concern  . None   Social History Narrative  . None   Additional Social History:    Allergies:  No Known Allergies  Labs:  Results for orders placed or performed during the hospital encounter of 08/27/16 (from the past 48 hour(s))  Rapid urine drug screen (hospital performed)     Status: Abnormal    Collection Time: 08/27/16  2:57 PM  Result Value Ref Range   Opiates NONE DETECTED NONE DETECTED   Cocaine NONE DETECTED NONE DETECTED   Benzodiazepines NONE DETECTED NONE DETECTED   Amphetamines NONE DETECTED NONE DETECTED   Tetrahydrocannabinol POSITIVE (A) NONE DETECTED   Barbiturates NONE DETECTED NONE DETECTED    Comment:        DRUG SCREEN FOR MEDICAL PURPOSES ONLY.  IF CONFIRMATION IS NEEDED FOR ANY PURPOSE, NOTIFY LAB WITHIN 5 DAYS.        LOWEST DETECTABLE LIMITS FOR URINE DRUG SCREEN Drug Class       Cutoff (ng/mL) Amphetamine      1000 Barbiturate      200 Benzodiazepine   482 Tricyclics       500 Opiates          300 Cocaine          300 THC              50   Ethanol     Status: None   Collection Time: 08/27/16  3:09 PM  Result Value Ref Range   Alcohol, Ethyl (B) <5 <5 mg/dL    Comment:        LOWEST DETECTABLE LIMIT FOR SERUM ALCOHOL IS 5 mg/dL FOR MEDICAL PURPOSES ONLY   Salicylate level     Status: None   Collection Time: 08/27/16  3:09 PM  Result Value Ref Range   Salicylate Lvl <3.7 2.8 - 30.0 mg/dL  Acetaminophen level     Status: Abnormal   Collection Time: 08/27/16  3:09 PM  Result Value Ref Range   Acetaminophen (Tylenol), Serum <10 (L) 10 - 30 ug/mL    Comment:        THERAPEUTIC CONCENTRATIONS VARY SIGNIFICANTLY. A RANGE OF 10-30 ug/mL MAY BE AN EFFECTIVE CONCENTRATION FOR MANY PATIENTS. HOWEVER, SOME ARE BEST TREATED AT CONCENTRATIONS OUTSIDE THIS RANGE. ACETAMINOPHEN CONCENTRATIONS >150 ug/mL AT 4 HOURS AFTER INGESTION AND >50 ug/mL AT 12 HOURS AFTER INGESTION ARE OFTEN ASSOCIATED WITH TOXIC REACTIONS.   Comprehensive metabolic panel     Status: Abnormal   Collection Time: 08/27/16  3:10 PM  Result Value Ref Range   Sodium 138 135 - 145 mmol/L   Potassium 4.2 3.5 - 5.1 mmol/L   Chloride 101 101 - 111 mmol/L   CO2 30 22 - 32 mmol/L   Glucose, Bld 90 65 - 99 mg/dL   BUN 16 6 - 20 mg/dL   Creatinine, Ser 1.51 (H) 0.61 - 1.24  mg/dL   Calcium 9.1 8.9 - 10.3 mg/dL   Total Protein 7.4 6.5 - 8.1 g/dL   Albumin 4.0 3.5 - 5.0 g/dL   AST 31 15 - 41 U/L   ALT 18 17 - 63 U/L   Alkaline Phosphatase 106 38 - 126 U/L   Total Bilirubin 0.8 0.3 - 1.2 mg/dL   GFR calc non Af Amer 53 (L) >60 mL/min   GFR calc Af Amer >60 >60 mL/min    Comment: (NOTE) The eGFR has been calculated  using the CKD EPI equation. This calculation has not been validated in all clinical situations. eGFR's persistently <60 mL/min signify possible Chronic Kidney Disease.    Anion gap 7 5 - 15  cbc     Status: Abnormal   Collection Time: 08/27/16  3:10 PM  Result Value Ref Range   WBC 4.3 4.0 - 10.5 K/uL   RBC 4.43 4.22 - 5.81 MIL/uL   Hemoglobin 13.1 13.0 - 17.0 g/dL   HCT 38.7 (L) 39.0 - 52.0 %   MCV 87.4 78.0 - 100.0 fL   MCH 29.6 26.0 - 34.0 pg   MCHC 33.9 30.0 - 36.0 g/dL   RDW 13.8 11.5 - 15.5 %   Platelets 222 150 - 400 K/uL    Current Facility-Administered Medications  Medication Dose Route Frequency Provider Last Rate Last Dose  . hydrOXYzine (ATARAX/VISTARIL) tablet 25 mg  25 mg Oral Q6H PRN Okey Regal, PA-C      . OLANZapine (ZYPREXA) tablet 10 mg  10 mg Oral QHS Jeffrey Hedges, PA-C   10 mg at 08/28/16 0034  . PARoxetine (PAXIL) tablet 20 mg  20 mg Oral Daily Okey Regal, PA-C      . QUEtiapine (SEROQUEL) tablet 200 mg  200 mg Oral QHS Okey Regal, PA-C   200 mg at 08/28/16 0033  . QUEtiapine (SEROQUEL) tablet 25 mg  25 mg Oral BID Okey Regal, PA-C   25 mg at 08/28/16 0034  . thiamine (VITAMIN B-1) tablet 100 mg  100 mg Oral Daily Okey Regal, PA-C      . traZODone (DESYREL) tablet 50 mg  50 mg Oral QHS PRN Okey Regal, PA-C   50 mg at 08/28/16 0034   Current Outpatient Prescriptions  Medication Sig Dispense Refill  . hydrOXYzine (ATARAX/VISTARIL) 25 MG tablet Take 1 tablet (25 mg total) by mouth every 6 (six) hours as needed (anxiety/agitation or CIWA < or = 10). 30 tablet 0  . OLANZapine (ZYPREXA) 10 MG  tablet Take 1 tablet (10 mg total) by mouth at bedtime. 30 tablet 0  . PARoxetine (PAXIL) 20 MG tablet Take 20 mg by mouth daily.    . QUEtiapine (SEROQUEL) 200 MG tablet Take 200 mg by mouth at bedtime.    Marland Kitchen QUEtiapine (SEROQUEL) 25 MG tablet Take 25 mg by mouth 2 (two) times daily.    Marland Kitchen thiamine 100 MG tablet Take 1 tablet (100 mg total) by mouth daily. 30 tablet 0  . traZODone (DESYREL) 50 MG tablet Take 1 tablet (50 mg total) by mouth at bedtime as needed for sleep. 30 tablet 0    Musculoskeletal: Strength & Muscle Tone: within normal limits Gait & Station: normal Patient leans: N/A  Psychiatric Specialty Exam: Physical Exam  Constitutional: He is oriented to person, place, and time. He appears well-developed and well-nourished.  HENT:  Head: Normocephalic.  Neck: Normal range of motion.  Respiratory: Effort normal.  Musculoskeletal: Normal range of motion.  Neurological: He is alert and oriented to person, place, and time.  Psychiatric: His speech is normal and behavior is normal. Judgment and thought content normal. His mood appears anxious. Cognition and memory are normal.    Review of Systems  Psychiatric/Behavioral: Positive for substance abuse. The patient is nervous/anxious.   All other systems reviewed and are negative.   Blood pressure 129/90, pulse 99, temperature 97.6 F (36.4 C), temperature source Oral, resp. rate 16, SpO2 99 %.There is no height or weight on file to calculate BMI.  General Appearance: Casual  Eye Contact:  Good  Speech:  Normal Rate  Volume:  Normal  Mood:  Anxious, mild  Affect:  Congruent  Thought Process:  Coherent and Descriptions of Associations: Intact  Orientation:  Full (Time, Place, and Person)  Thought Content:  WDL and Logical  Suicidal Thoughts:  No  Homicidal Thoughts:  No  Memory:  Immediate;   Good Recent;   Good Remote;   Good  Judgement:  Fair  Insight:  Fair  Psychomotor Activity:  Normal  Concentration:   Concentration: Good and Attention Span: Good  Recall:  Good  Fund of Knowledge:  Fair  Language:  Good  Akathisia:  No  Handed:  Right  AIMS (if indicated):     Assets:  Leisure Time Physical Health Resilience Social Support  ADL's:  Intact  Cognition:  WNL  Sleep:        Treatment Plan Summary: Daily contact with patient to assess and evaluate symptoms and progress in treatment, Medication management and Plan adjustment disorder with disturbance of emotions and conduct:  -Crisis stabilization -Medication management:  Restarted Paxil 20 mg daily for depression, Seroquel 200 mg at bedtime and 25 mg BID for sleep and mood,  Vistaril 25 mg every six hours PRN anxiety, and Trazodone 50 mg at bedtime PRN sleep -Individual counseling  Disposition: No evidence of imminent risk to self or others at present.    Waylan Boga, NP 08/28/2016 10:11 AM  Patient seen face-to-face for psychiatric evaluation, chart reviewed and case discussed with the physician extender and developed treatment plan. Reviewed the information documented and agree with the treatment plan. Corena Pilgrim, MD

## 2016-08-28 NOTE — ED Notes (Signed)
Pt discharged ambulatory with discharge instructions and a bus pass.  Pt was calm and cooperative.  All belongings were returned to pt.

## 2016-08-28 NOTE — [facility] Suicide Risk Assessment (Signed)
Suicide Risk Assessment  Discharge Assessment   [facility]   Principal Problem: Adjustment disorder with mixed disturbance of emotions and conduct Discharge Diagnoses:  Patient Active Problem List   Diagnosis Date Noted  . Adjustment disorder with mixed disturbance of emotions and conduct [F43.25] 06/04/2016    Priority: High  . Bipolar 1 disorder, mixed, moderate (HCC) [F31.62]   . Adjustment disorder [F43.20] 07/20/2016  . MDD (major depressive disorder), recurrent, severe, with psychosis (HCC) [F33.3] 07/18/2016  . 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 CHEST PAIN, PLEURITIC [R07.1] 09/16/2007    Total Time spent with patient: 45 minutes  Musculoskeletal: Strength & Muscle Tone: within normal limits Gait & Station: normal Patient leans: N/A  Psychiatric Specialty Exam: Physical Exam  Constitutional: He is oriented to person, place, and time. He appears well-developed and well-nourished.  HENT:  Head: Normocephalic.  Neck: Normal range of motion.  Respiratory: Effort normal.  Musculoskeletal: Normal range of motion.  Neurological: He is alert and oriented to person, place, and time.  Psychiatric: His speech is normal and behavior is normal. Judgment and thought content normal. His mood appears anxious. Cognition and memory are normal.    Review of Systems  Psychiatric/Behavioral: Positive for substance abuse. The patient is nervous/anxious.   All other systems reviewed and are negative.   Blood pressure 129/90, pulse 99, temperature 97.6 F (36.4 C), temperature source Oral, resp. rate 16, SpO2 99 %.There is no height or weight on file to calculate BMI.  General Appearance: Casual  Eye Contact:  Good  Speech:  Normal Rate  Volume:  Normal  Mood:  Anxious, mild  Affect:  Congruent  Thought Process:  Coherent and Descriptions of Associations: Intact  Orientation:  Full (Time, Place, and Person)  Thought  Content:  WDL and Logical  Suicidal Thoughts:  No  Homicidal Thoughts:  No  Memory:  Immediate;   Good Recent;   Good Remote;   Good  Judgement:  Fair  Insight:  Fair  Psychomotor Activity:  Normal  Concentration:  Concentration: Good and Attention Span: Good  Recall:  Good  Fund of Knowledge:  Fair  Language:  Good  Akathisia:  No  Handed:  Right  AIMS (if indicated):     Assets:  Leisure Time Physical Health Resilience Social Support  ADL's:  Intact  Cognition:  WNL  Sleep:       Mental Status Per Nursing Assessment::   On Admission:   depression with suicidal ideations  Demographic Factors:  Male  Loss Factors: NA  Historical Factors: NA  Risk Reduction Factors:   Sense of responsibility to family and Positive social support  Continued Clinical Symptoms:  Anxiety, mild  Cognitive Features That Contribute To Risk:  None    Suicide Risk:  Minimal: No identifiable suicidal ideation.  Patients presenting with no risk factors but with morbid ruminations; may be classified as minimal risk based on the severity of the depressive symptoms    Plan Of Care/Follow-up recommendations:  Activity:  as tolerated  Diet:  heart healthy diet  LORD, JAMISON, NP 08/28/2016, 11:11 AM

## 2016-08-28 NOTE — BH Assessment (Signed)
[facility] Assessment Progress Note  Per Thedore MinsMojeed Akintayo, MD, this pt does not require psychiatric hospitalization at this time.  [facility], which Dr Jannifer FranklinAkintayo has rescinded.  [facility]  This has been included in pt's discharge instructions.  Pt's nurse, Kendal Hymendie, has been notified.  Doylene Canninghomas Renleigh Ouellet, MA Triage Specialist [phone]

## 2016-08-28 NOTE — Discharge Instructions (Addendum)
For your ongoing mental health needs, you are advised to follow up with [facility].  If you do not currently have an appointment, new and returning patients are seen at their walk-in clinic.  Walk-in hours are Monday - Friday from 8:00 am - 3:00 pm.  Walk-in patients are seen on a first come, first served basis.  Try to arrive as early as possible for he best chance of being seen the same day: ° °     [facility] °     [address] °     [place], [place] [address] °     [phone] °

## 2016-10-02 ENCOUNTER — Encounter (HOSPITAL_COMMUNITY): Payer: Self-pay | Admitting: Emergency Medicine

## 2016-10-02 ENCOUNTER — Emergency Department (HOSPITAL_COMMUNITY)
Admission: EM | Admit: 2016-10-02 | Discharge: 2016-10-02 | Disposition: A | Discharge status: Home / Self Care | Payer: Self-pay | Attending: Emergency Medicine | Admitting: Emergency Medicine

## 2016-10-02 ENCOUNTER — Emergency Department (HOSPITAL_COMMUNITY): Payer: Self-pay

## 2016-10-02 DIAGNOSIS — S82831A Other fracture of upper and lower end of right fibula, initial encounter for closed fracture: Secondary | ICD-10-CM | POA: Insufficient documentation

## 2016-10-02 DIAGNOSIS — Y929 Unspecified place or not applicable: Secondary | ICD-10-CM | POA: Insufficient documentation

## 2016-10-02 DIAGNOSIS — Z87891 Personal history of nicotine dependence: Secondary | ICD-10-CM | POA: Insufficient documentation

## 2016-10-02 DIAGNOSIS — I2510 Atherosclerotic heart disease of native coronary artery without angina pectoris: Secondary | ICD-10-CM | POA: Insufficient documentation

## 2016-10-02 DIAGNOSIS — X501XXA Overexertion from prolonged static or awkward postures, initial encounter: Secondary | ICD-10-CM | POA: Insufficient documentation

## 2016-10-02 DIAGNOSIS — I252 Old myocardial infarction: Secondary | ICD-10-CM | POA: Insufficient documentation

## 2016-10-02 DIAGNOSIS — Y999 Unspecified external cause status: Secondary | ICD-10-CM | POA: Insufficient documentation

## 2016-10-02 DIAGNOSIS — Y9389 Activity, other specified: Secondary | ICD-10-CM | POA: Insufficient documentation

## 2016-10-02 MED ORDER — IBUPROFEN 800 MG PO TABS
800.0000 mg | ORAL_TABLET | Freq: Once | ORAL | Status: AC
  Administered 2016-10-02: 800 mg via ORAL
  Filled 2016-10-02: qty 1

## 2016-10-02 MED ORDER — HYDROCODONE-ACETAMINOPHEN 5-325 MG PO TABS: 1.0000 | ORAL_TABLET | Freq: Four times a day (QID) | ORAL | 0 refills | Status: DC | PRN

## 2016-10-02 NOTE — ED Notes (Signed)
Pt from home via [facility] with complaints of right ankle pain following a fall while playing with his nephews. Pt has swelling to the extremity. Pt also states he drank a 40 and a half of a 40. Pt denies other injuries or LOC

## 2016-10-02 NOTE — ED Provider Notes (Signed)
[facility]-EMERGENCY DEPT Provider Note: Lowella Dell, MD, FACEP  CSN: [id number] MRN: [id number] ARRIVAL: 10/02/16 at 0100 ROOM: [facility]  By signing my name below, I, Diona Browner, attest that this documentation has been prepared under the direction and in the presence of Paula Libra, MD. Electronically Signed: Diona Browner, ED Scribe. 10/02/16. 3:45 AM.  CHIEF COMPLAINT  Ankle Injury (right )   HISTORY OF PRESENT ILLNESS  Dean Howard is a 51 y.o. male with a PMHx of CAD and MI. Pt presents to the Emergency Department c/o right ankle pain that started around 8 pm yesterday evening after twisting it while playing with his nephews. He is unable to bear weight on that ankle. He rates pain as moderate to severe, worse with movement or palpation. Pt can wiggle his toes. He denies other injury. His ankle was placed in a splint by EMS prior to arrival. He admits to drinking alcohol earlier.     Past Medical History:  Diagnosis Date  . Coronary artery disease   . MI, old   . Tick fever     Past Surgical History:  Procedure Laterality Date  . HERNIA REPAIR    . KNEE SURGERY      History reviewed. No pertinent family history.  Social History  Substance Use Topics  . Smoking status: Former Smoker    Packs/day: 0.25    Types: Cigarettes    Quit date: 03/04/2016  . Smokeless tobacco: Never Used  . Alcohol use Yes     Comment: about 14 beers per day    Prior to Admission medications   Medication Sig Start Date End Date Taking? Authorizing Provider  HYDROcodone-acetaminophen (NORCO) 5-325 MG tablet Take 1-2 tablets by mouth every 6 (six) hours as needed for severe pain. 10/02/16   Niyla Marone, MD  hydrOXYzine (ATARAX/VISTARIL) 25 MG tablet Take 1 tablet (25 mg total) by mouth every 6 (six) hours as needed (anxiety/agitation or CIWA < or = 10). 07/24/16   Adonis Brook, NP  OLANZapine (ZYPREXA) 10 MG tablet Take 1 tablet (10 mg total) by mouth at bedtime. 07/24/16   Adonis Brook, NP  PARoxetine (PAXIL) 20 MG tablet Take 20 mg by mouth daily.    Historical Provider, MD  QUEtiapine (SEROQUEL) 200 MG tablet Take 200 mg by mouth at bedtime.    Historical Provider, MD  QUEtiapine (SEROQUEL) 25 MG tablet Take 25 mg by mouth 2 (two) times daily.    Historical Provider, MD  thiamine 100 MG tablet Take 1 tablet (100 mg total) by mouth daily. 07/25/16   Adonis Brook, NP  traZODone (DESYREL) 50 MG tablet Take 1 tablet (50 mg total) by mouth at bedtime as needed for sleep. 07/24/16   Adonis Brook, NP    Allergies Patient has no known allergies.   REVIEW OF SYSTEMS  Negative except as noted here or in the History of Present Illness.   PHYSICAL EXAMINATION  Initial Vital Signs Blood pressure 112/77, pulse 77, temperature 97.9 F (36.6 C), temperature source Oral, resp. rate 18, SpO2 99 %.  Examination General: Well-developed, well-nourished male in no acute distress; appearance consistent with age of record HENT: normocephalic; atraumatic Eyes: pupils equal, round and reactive to light; extraocular muscles intact; normal appearance Neck: supple Heart: regular rate and rhythm Lungs: clear to auscultation bilaterally Abdomen: soft; nondistended Extremities: No deformity; full range of motion except right ankle; pulses normal; swelling and tenderness of the right ankle with pain on attempted range of motion; right foot  distally neurovascular intact with intact tendon function in the toes Neurologic: sleeping but easily awakened; dysarthic; motor function intact in all extremities and symmetric; no facial droop Skin: Warm and dry Psychiatric: Flat affect   RESULTS  Summary of this visit's results, reviewed by myself:   EKG Interpretation  Date/Time:    Ventricular Rate:    PR Interval:    QRS Duration:   QT Interval:    QTC Calculation:   R Axis:     Text Interpretation:        Laboratory Studies: No results found for this or any previous visit  (from the past 24 hour(s)). Imaging Studies: Dg Ankle Complete Right  Result Date: 10/02/2016 CLINICAL DATA:  Ankle pain after twisting injury EXAM: RIGHT ANKLE - COMPLETE 3+ VIEW COMPARISON:  None. FINDINGS: There is an acute, closed, oblique fracture of the distal fibula extending into the ankle joint with slight dorsal displacement of the distal fracture fragment. Minimal widening of the ankle mortise anteriorly and medially by 8 mm and 6 mm respectively. The base of fifth metatarsal appears intact. No medial or posterior malleolar fracture is seen. The subtalar and midfoot articulations are grossly intact. Minimal dorsal spurring of the talus. Tiny posterior calcaneal enthesophyte. IMPRESSION: Acute, oblique, intra-articular, minimally displaced fracture of the distal fibular metaphysis extending into the ankle joint with minimal widening of the ankle mortise anteriorly and medially. Overlying soft tissue swelling is noted more so over the lateral malleolus. Electronically Signed   By: Tollie Eth M.D.   On: 10/02/2016 02:19    ED COURSE  Nursing notes and initial vitals signs, including pulse oximetry, reviewed.  Vitals:   10/02/16 0101 10/02/16 0110 10/02/16 0129  BP: 112/77    Pulse: 77    Resp: 18    Temp: 97.9 F (36.6 C)    TempSrc: Oral    SpO2: 100% 98% 99%    PROCEDURES   Splint placed by orthopedic technician. Patient provided with crutches and referred to Dr. Jerl Santos.  ED DIAGNOSES     ICD-9-CM ICD-10-CM   1. Other closed fracture of distal end of right fibula, initial encounter 824.8 S82.831A     I personally performed the services described in this documentation, which was scribed in my presence. The recorded information has been reviewed and is accurate.     Paula Libra, MD 10/02/16 [phone]

## 2016-10-02 NOTE — ED Notes (Signed)
ORTHO called to place splint, crutches ready

## 2016-10-02 NOTE — Progress Notes (Signed)
Orthopedic Tech Progress Note Patient Details:  Dean Howard 1966-06-12 [id number]  Ortho Devices Type of Ortho Device: Post (short leg) splint, Stirrup splint Ortho Device/Splint Location: rle Ortho Device/Splint Interventions: Ordered, Application   Trinna Post 10/02/2016, 4:33 AM

## 2016-11-06 ENCOUNTER — Encounter: Payer: Self-pay | Admitting: Internal Medicine

## 2016-11-07 ENCOUNTER — Encounter: Payer: Self-pay | Admitting: Internal Medicine

## 2016-11-10 ENCOUNTER — Encounter: Payer: Self-pay | Admitting: Internal Medicine

## 2017-09-15 DIAGNOSIS — F4325 Adjustment disorder with mixed disturbance of emotions and conduct: Secondary | ICD-10-CM

## 2017-10-03 IMAGING — CR DG TOE 2ND 2+V*L*
3 series · 3 of 3 positions shown · non-contrast
Comparison: 09/06/2015

CLINICAL DATA: Followup fracture second toe.  Pain.

EXAM:
LEFT SECOND TOE

[toe ap]
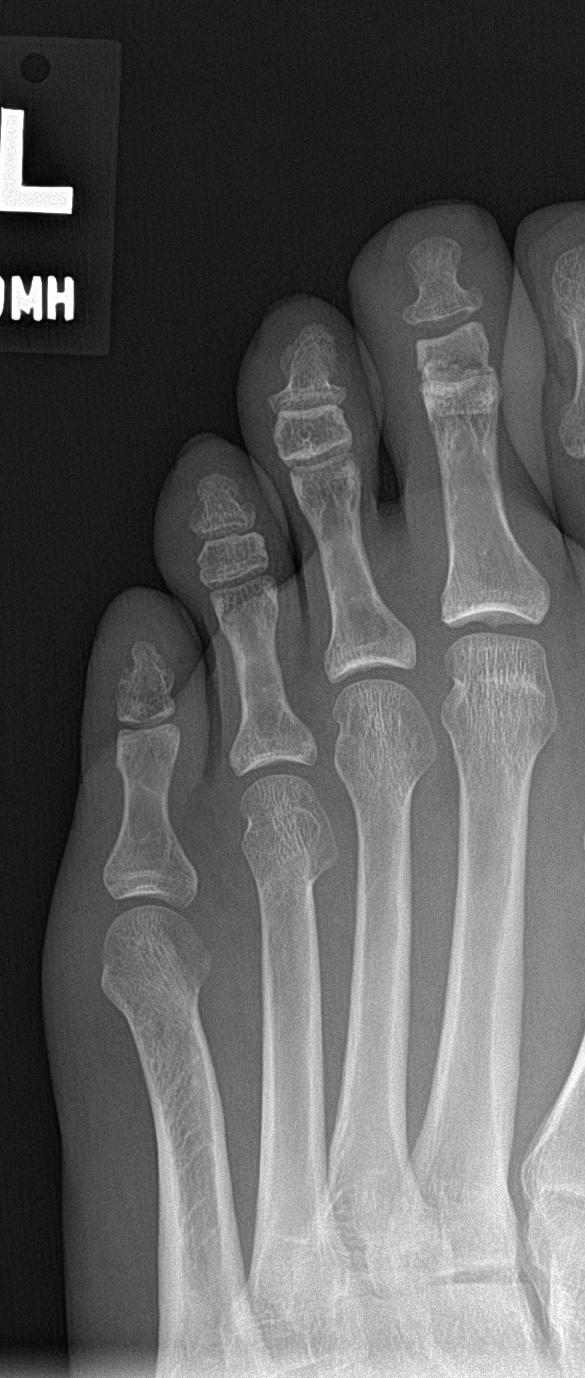

[toe obl]
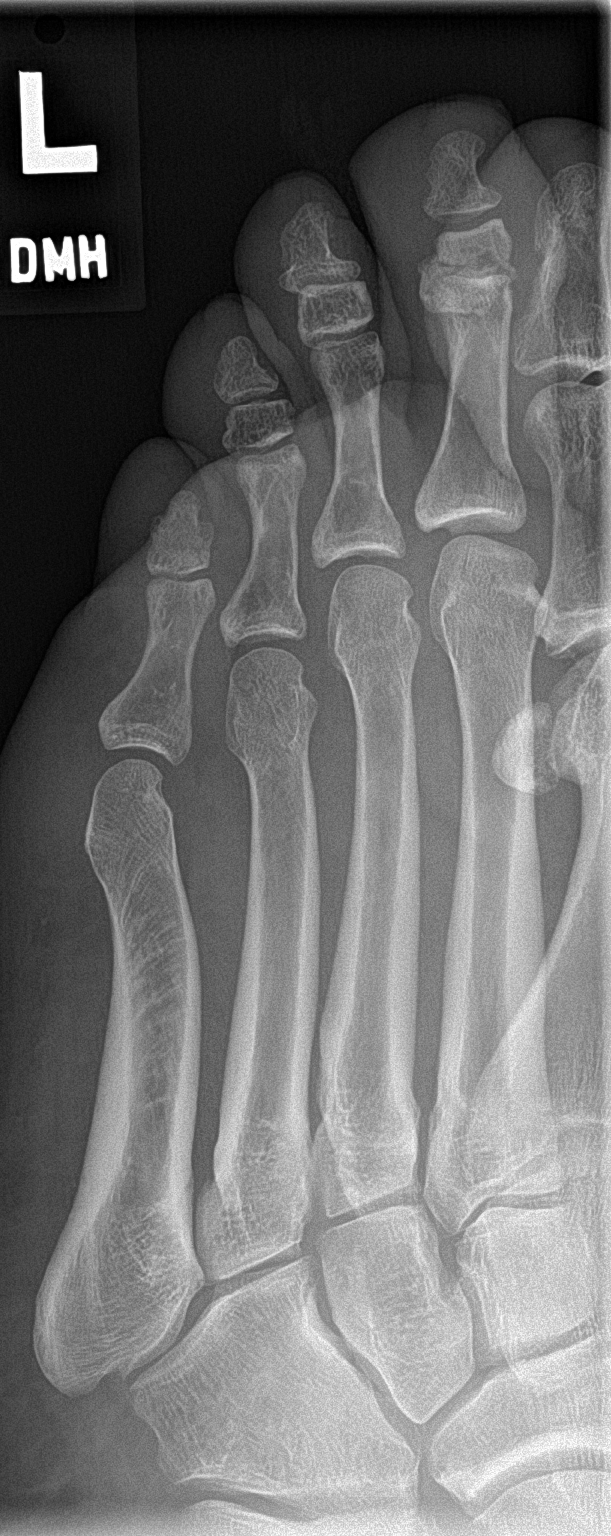

[toe lat]
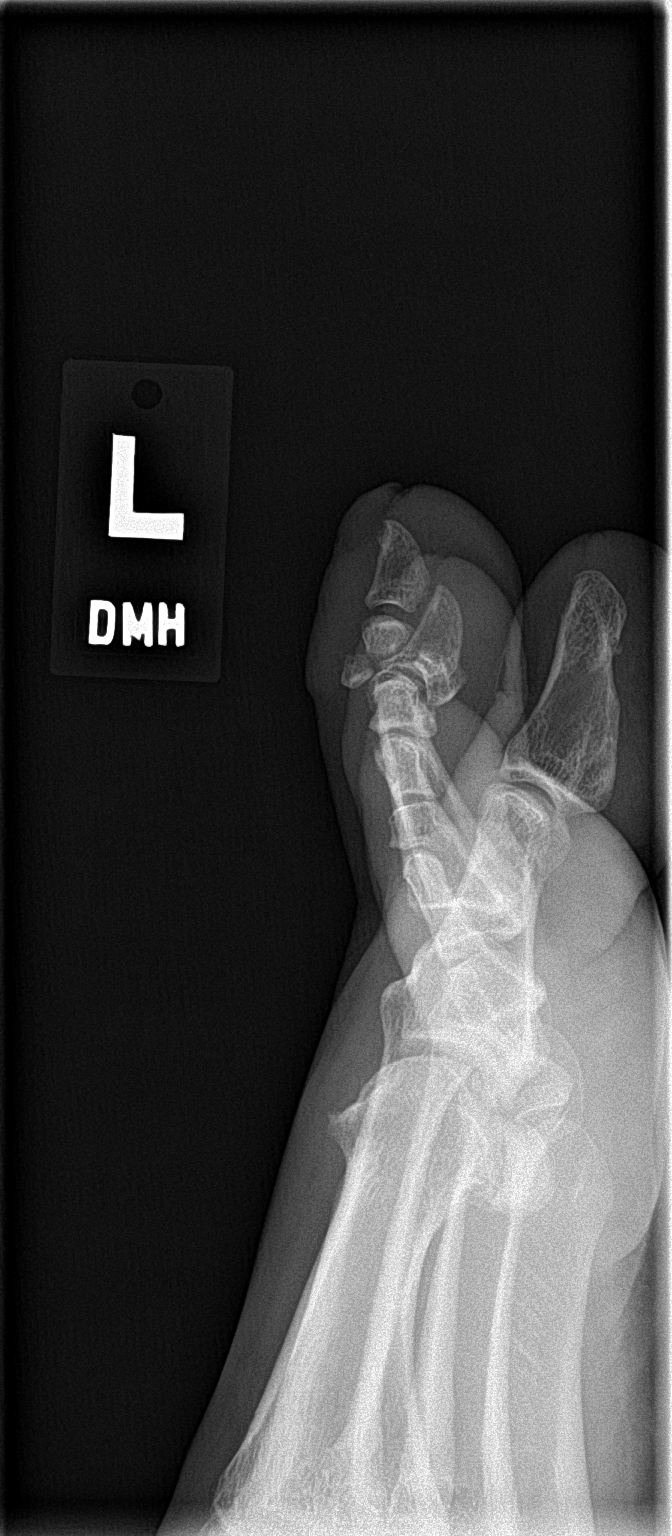

[3 of 3 positions shown; findings below may reference images not displayed]

FINDINGS: Fracture of the second middle phalanx. Anterior displacement of a
fracture fragment appears to have progressed in the interval. No
significant bony healing. No new fracture
IMPRESSION: Displaced fracture second middle phalanx. Progressive anterior
placement of fracture fragment.

## 2017-12-08 ENCOUNTER — Emergency Department (HOSPITAL_COMMUNITY): Payer: Self-pay

## 2017-12-08 ENCOUNTER — Emergency Department (HOSPITAL_COMMUNITY)
Admission: EM | Admit: 2017-12-08 | Discharge: 2017-12-08 | Disposition: A | Discharge status: Home / Self Care | Payer: Self-pay | Attending: Emergency Medicine | Admitting: Emergency Medicine

## 2017-12-08 ENCOUNTER — Encounter (HOSPITAL_COMMUNITY): Payer: Self-pay | Admitting: Emergency Medicine

## 2017-12-08 DIAGNOSIS — J019 Acute sinusitis, unspecified: Secondary | ICD-10-CM | POA: Insufficient documentation

## 2017-12-08 DIAGNOSIS — R112 Nausea with vomiting, unspecified: Secondary | ICD-10-CM | POA: Insufficient documentation

## 2017-12-08 DIAGNOSIS — G43009 Migraine without aura, not intractable, without status migrainosus: Secondary | ICD-10-CM | POA: Insufficient documentation

## 2017-12-08 DIAGNOSIS — K760 Fatty (change of) liver, not elsewhere classified: Secondary | ICD-10-CM | POA: Insufficient documentation

## 2017-12-08 DIAGNOSIS — G43909 Migraine, unspecified, not intractable, without status migrainosus: Secondary | ICD-10-CM

## 2017-12-08 DIAGNOSIS — R10811 Right upper quadrant abdominal tenderness: Secondary | ICD-10-CM

## 2017-12-08 DIAGNOSIS — Z79899 Other long term (current) drug therapy: Secondary | ICD-10-CM | POA: Insufficient documentation

## 2017-12-08 DIAGNOSIS — K2920 Alcoholic gastritis without bleeding: Secondary | ICD-10-CM | POA: Insufficient documentation

## 2017-12-08 DIAGNOSIS — Z7289 Other problems related to lifestyle: Secondary | ICD-10-CM

## 2017-12-08 DIAGNOSIS — F1010 Alcohol abuse, uncomplicated: Secondary | ICD-10-CM | POA: Insufficient documentation

## 2017-12-08 DIAGNOSIS — Z789 Other specified health status: Secondary | ICD-10-CM

## 2017-12-08 DIAGNOSIS — R1011 Right upper quadrant pain: Secondary | ICD-10-CM | POA: Insufficient documentation

## 2017-12-08 LAB — COMPREHENSIVE METABOLIC PANEL
ALBUMIN: 3.2 g/dL — AB (ref 3.5–5.0)
ALT: 45 U/L (ref 17–63)
AST: 64 U/L — AB (ref 15–41)
Alkaline Phosphatase: 87 U/L (ref 38–126)
Anion gap: 7 (ref 5–15)
BILIRUBIN TOTAL: 1.3 mg/dL — AB (ref 0.3–1.2)
BUN: 10 mg/dL (ref 6–20)
CHLORIDE: 104 mmol/L (ref 101–111)
CO2: 27 mmol/L (ref 22–32)
Calcium: 8.8 mg/dL — ABNORMAL LOW (ref 8.9–10.3)
Creatinine, Ser: 1.62 mg/dL — ABNORMAL HIGH (ref 0.61–1.24)
GFR calc Af Amer: 56 mL/min — ABNORMAL LOW (ref >60)
GFR calc non Af Amer: 48 mL/min — ABNORMAL LOW (ref >60)
GLUCOSE: 78 mg/dL (ref 65–99)
POTASSIUM: 3.8 mmol/L (ref 3.5–5.1)
SODIUM: 138 mmol/L (ref 135–145)
Total Protein: 6.5 g/dL (ref 6.5–8.1)

## 2017-12-08 LAB — CBC WITH DIFFERENTIAL/PLATELET
Abs Immature Granulocytes: 0 10*3/uL (ref 0.0–0.1)
BASOS ABS: 0.1 10*3/uL (ref 0.0–0.1)
Basophils Relative: 1 %
Eosinophils Absolute: 0.2 10*3/uL (ref 0.0–0.7)
Eosinophils Relative: 5 %
HEMATOCRIT: 32.7 % — AB (ref 39.0–52.0)
Hemoglobin: 11.1 g/dL — ABNORMAL LOW (ref 13.0–17.0)
IMMATURE GRANULOCYTES: 0 %
LYMPHS ABS: 1.8 10*3/uL (ref 0.7–4.0)
Lymphocytes Relative: 43 %
MCH: 31.7 pg (ref 26.0–34.0)
MCHC: 33.9 g/dL (ref 30.0–36.0)
MCV: 93.4 fL (ref 78.0–100.0)
Monocytes Absolute: 0.4 10*3/uL (ref 0.1–1.0)
Monocytes Relative: 10 %
NEUTROS PCT: 41 %
Neutro Abs: 1.7 10*3/uL (ref 1.7–7.7)
PLATELETS: 191 10*3/uL (ref 150–400)
RBC: 3.5 MIL/uL — AB (ref 4.22–5.81)
RDW: 16.3 % — ABNORMAL HIGH (ref 11.5–15.5)
WBC: 4.2 10*3/uL (ref 4.0–10.5)

## 2017-12-08 LAB — LIPASE, BLOOD: Lipase: 29 U/L (ref 11–51)

## 2017-12-08 LAB — URINALYSIS, ROUTINE W REFLEX MICROSCOPIC
Bilirubin Urine: NEGATIVE
GLUCOSE, UA: NEGATIVE mg/dL
Hgb urine dipstick: NEGATIVE
Ketones, ur: 5 mg/dL — AB
LEUKOCYTES UA: NEGATIVE
NITRITE: NEGATIVE
PH: 5 (ref 5.0–8.0)
Protein, ur: NEGATIVE mg/dL
SPECIFIC GRAVITY, URINE: 1.016 (ref 1.005–1.030)

## 2017-12-08 MED ORDER — GI COCKTAIL ~~LOC~~
30.0000 mL | Freq: Once | ORAL | Status: AC
  Administered 2017-12-08: 30 mL via ORAL
  Filled 2017-12-08: qty 30

## 2017-12-08 MED ORDER — KETOROLAC TROMETHAMINE 30 MG/ML IJ SOLN
15.0000 mg | Freq: Once | INTRAMUSCULAR | Status: AC
  Administered 2017-12-08: 15 mg via INTRAVENOUS
  Filled 2017-12-08: qty 1

## 2017-12-08 MED ORDER — DIPHENHYDRAMINE HCL 50 MG/ML IJ SOLN
25.0000 mg | Freq: Once | INTRAMUSCULAR | Status: AC
  Administered 2017-12-08: 25 mg via INTRAVENOUS
  Filled 2017-12-08: qty 1

## 2017-12-08 MED ORDER — SODIUM CHLORIDE 0.9 % IV BOLUS (SEPSIS)
1000.0000 mL | Freq: Once | INTRAVENOUS | Status: AC
  Administered 2017-12-08: 1000 mL via INTRAVENOUS

## 2017-12-08 MED ORDER — METOCLOPRAMIDE HCL 10 MG PO TABS: 10.0000 mg | ORAL_TABLET | Freq: Four times a day (QID) | ORAL | 0 refills | Status: DC | PRN

## 2017-12-08 MED ORDER — FAMOTIDINE IN NACL 20-0.9 MG/50ML-% IV SOLN
20.0000 mg | Freq: Once | INTRAVENOUS | Status: AC
  Administered 2017-12-08: 20 mg via INTRAVENOUS
  Filled 2017-12-08: qty 50

## 2017-12-08 MED ORDER — RANITIDINE HCL 150 MG PO TABS: 150.0000 mg | ORAL_TABLET | Freq: Two times a day (BID) | ORAL | 0 refills | Status: DC

## 2017-12-08 MED ORDER — METOCLOPRAMIDE HCL 5 MG/ML IJ SOLN
10.0000 mg | Freq: Once | INTRAMUSCULAR | Status: AC
  Administered 2017-12-08: 10 mg via INTRAVENOUS
  Filled 2017-12-08: qty 2

## 2017-12-08 NOTE — ED Notes (Signed)
Patient transported to US 

## 2017-12-08 NOTE — ED Notes (Signed)
ED Provider at bedside. 

## 2017-12-08 NOTE — Discharge Instructions (Addendum)
Your headache is likely due to sinus congestion. Use over the counter flonase and a netipot to help with this; you may consider using over the counter antihistamines like benadryl/zyrtec/claritin/etc to help with symptoms as well. Use reglan as directed as needed for nausea/headaches, use tylenol as needed for headaches/pain.   Your abdominal pain is likely from gastritis or an ulcer, probably from your alcohol use. Stop drinking alcohol! You will need to take zantac as directed, and avoid spicy/fatty/acidic foods, avoid soda/coffee/tea/alcohol. Avoid laying down flat within 30 minutes of eating. Avoid NSAIDs like ibuprofen/aleve/motrin/etc on an empty stomach. May consider using over the counter tums/maalox as needed for additional relief. Use reglan as directed as needed for nausea. Use tylenol as needed for pain. Follow up with your regular doctor in 5-7 days for recheck of symptoms. Return to the ER for changes or worsening symptoms.  Abdominal (belly) pain can be caused by many things. Your caregiver performed an examination and possibly ordered blood/urine tests and imaging (CT scan, x-rays, ultrasound). Many cases can be observed and treated at home after initial evaluation in the emergency department. Even though you are being discharged home, abdominal pain can be unpredictable. Therefore, you need a repeated exam if your pain does not resolve, returns, or worsens. Most patients with abdominal pain don't have to be admitted to the hospital or have surgery, but serious problems like appendicitis and gallbladder attacks can start out as nonspecific pain. Many abdominal conditions cannot be diagnosed in one visit, so follow-up evaluations are very important. SEEK IMMEDIATE MEDICAL ATTENTION IF YOU DEVELOP ANY OF THE FOLLOWING SYMPTOMS: The pain does not go away or becomes severe.  A temperature above 101 develops.  Repeated vomiting occurs (multiple episodes).  The pain becomes localized to portions  of the abdomen. The right side could possibly be appendicitis. In an adult, the left lower portion of the abdomen could be colitis or diverticulitis.  Blood is being passed in stools or vomit (bright red or black tarry stools).  Return also if you develop chest pain, difficulty breathing, dizziness or fainting, or become confused, poorly responsive, or inconsolable (young children). The constipation stays for more than 4 days.  There is belly (abdominal) or rectal pain.  You do not seem to be getting better.

## 2017-12-08 NOTE — ED Provider Notes (Signed)
[facility] EMERGENCY DEPARTMENT Provider Note   CSN: [id number] Arrival date & time: 12/08/17  1119     History   Chief Complaint Chief Complaint  Patient presents with  . Migraine    HPI Dean Howard is a 52 y.o. male with a PMHx of CAD, bipolar disorder, depression, adjustment disorder, and other conditions listed below, who presents to the ED with complaints of "migraine" headache x 5 days.  He states that he has had headaches like this before, describes it as 8/10 intermittent throbbing nonradiating frontal headache that worsens with getting frustrated with people and has been unrelieved with Excedrin.  He reports associated lightheadedness, nausea, 3 episodes today of nonbloody nonbilious emesis, bilateral hands and feet tingling, and sinus congestion with rhinorrhea.  He admits to drinking alcohol regularly, states that he has about 3-4 beers per day during the week and about a case of beer on the weekends, his last alcoholic beverage was yesterday around 6 PM when he had one beer.  He denies any fevers, chills, neck stiffness, sore throat, ear pain or drainage, cough, CP, SOB, abdominal pain, hematemesis, diarrhea, constipation, melena, hematochezia, myalgias, arthralgias, numbness, focal weakness, vision changes, or any other complaints at this time.  The history is provided by the patient and medical records. No language interpreter was used.  Migraine  Associated symptoms include headaches. Pertinent negatives include no chest pain, no abdominal pain and no shortness of breath.    Past Medical History:  Diagnosis Date  . Coronary artery disease   . MI, old   . Tick fever     Patient Active Problem List   Diagnosis Date Noted  . Bipolar 1 disorder, mixed, moderate (HCC)   . Adjustment disorder 07/20/2016  . MDD (major depressive disorder), recurrent, severe, with psychosis (HCC) 07/18/2016  . 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 CHEST PAIN, PLEURITIC 09/16/2007    Past Surgical History:  Procedure Laterality Date  . HERNIA REPAIR    . KNEE SURGERY          Home Medications    Prior to Admission medications   Medication Sig Start Date End Date Taking? Authorizing Provider  HYDROcodone-acetaminophen (NORCO) 5-325 MG tablet Take 1-2 tablets by mouth every 6 (six) hours as needed for severe pain. 10/02/16   Molpus, Jonny Ruiz, MD  hydrOXYzine (ATARAX/VISTARIL) 25 MG tablet Take 1 tablet (25 mg total) by mouth every 6 (six) hours as needed (anxiety/agitation or CIWA < or = 10). 07/24/16   Adonis Brook, NP  OLANZapine (ZYPREXA) 10 MG tablet Take 1 tablet (10 mg total) by mouth at bedtime. 07/24/16   Adonis Brook, NP  PARoxetine (PAXIL) 20 MG tablet Take 20 mg by mouth daily.    [provider]  QUEtiapine (SEROQUEL) 200 MG tablet Take 200 mg by mouth at bedtime.    [provider]  QUEtiapine (SEROQUEL) 25 MG tablet Take 25 mg by mouth 2 (two) times daily.    [provider]  thiamine 100 MG tablet Take 1 tablet (100 mg total) by mouth daily. 07/25/16   Adonis Brook, NP  traZODone (DESYREL) 50 MG tablet Take 1 tablet (50 mg total) by mouth at bedtime as needed for sleep. 07/24/16   Adonis Brook, NP    Family History No family history on file.  Social History Social History   Tobacco Use  . Smoking status: Former  Smoker    Packs/day: 0.25    Types: Cigarettes    Last attempt to quit: 03/04/2016    Years since quitting: 1.7  . Smokeless tobacco: Never Used  Substance Use Topics  . Alcohol use: Yes    Comment: about 14 beers per day  . Drug use: Yes    Types: Marijuana     Allergies   Patient has no known allergies.   Review of Systems Review of Systems  Constitutional: Negative for chills and fever.  HENT: Positive for congestion and rhinorrhea. Negative for ear discharge, ear pain and  sore throat.   Eyes: Negative for visual disturbance.  Respiratory: Negative for cough and shortness of breath.   Cardiovascular: Negative for chest pain.  Gastrointestinal: Positive for nausea and vomiting. Negative for abdominal pain, blood in stool, constipation and diarrhea.  Genitourinary: Negative for dysuria and hematuria.  Musculoskeletal: Negative for arthralgias, myalgias and neck stiffness.  Skin: Negative for color change.  Allergic/Immunologic: Negative for immunocompromised state.  Neurological: Positive for light-headedness and headaches. Negative for weakness and numbness.       +tingling b/l hands/feet  Psychiatric/Behavioral: Negative for confusion.   All other systems reviewed and are negative for acute change except as noted in the HPI.    Physical Exam Updated Vital Signs BP 121/86   Pulse 77   Temp 98.6 F (37 C) (Oral)   Resp 18   SpO2 100%   Physical Exam  Constitutional: He is oriented to person, place, and time. Vital signs are normal. He appears well-developed and well-nourished.  Non-toxic appearance. No distress.  Afebrile, nontoxic, NAD  HENT:  Head: Normocephalic and atraumatic.  Nose: Mucosal edema present. Right sinus exhibits maxillary sinus tenderness. Left sinus exhibits maxillary sinus tenderness.  Mouth/Throat: Uvula is midline, oropharynx is clear and moist and mucous membranes are normal. No trismus in the jaw. No uvula swelling.  Nose with mild mucosal edema/congestion and b/l maxillary sinus TTP. Oropharynx clear and moist, without uvular swelling or deviation, no trismus or drooling, no tonsillar swelling or erythema, no exudates.    Eyes: Pupils are equal, round, and reactive to light. Conjunctivae and EOM are normal. Right eye exhibits no discharge. Left eye exhibits no discharge.  PERRL, EOMI, no nystagmus  Neck: Normal range of motion. Neck supple. Muscular tenderness present. No spinous process tenderness present. No neck rigidity.  Normal range of motion present.  FROM intact without spinous process TTP, no bony stepoffs or deformities, with mild R sided paraspinous muscle TTP and muscle spasms extending into the trapezius area. No rigidity or meningeal signs. No bruising or swelling.   Cardiovascular: Normal rate, regular rhythm, normal heart sounds and intact distal pulses. Exam reveals no gallop and no friction rub.  No murmur heard. Pulmonary/Chest: Effort normal and breath sounds normal. No respiratory distress. He has no decreased breath sounds. He has no wheezes. He has no rhonchi. He has no rales.  Abdominal: Soft. Normal appearance and bowel sounds are normal. He exhibits no distension. There is tenderness in the right upper quadrant and epigastric area. There is positive Murphy's sign. There is no rigidity, no rebound, no guarding, no CVA tenderness and no tenderness at McBurney's point.  Soft, nondistended, +BS throughout, with mild epigastric and RUQ TTP no r/g/r, +pain elicited with murphy's exam but pt able to fully inspire, neg mcburney's, no CVA TTP   Musculoskeletal: Normal range of motion.  MAE x4 Strength and sensation grossly intact in all extremities Distal pulses intact  Gait steady  Neurological: He is alert and oriented to person, place, and time. He has normal strength. No cranial nerve deficit or sensory deficit. Coordination and gait normal. GCS eye subscore is 4. GCS verbal subscore is 5. GCS motor subscore is 6.  CN 2-12 grossly intact A&O x4 GCS 15 Sensation and strength intact Gait nonataxic including with tandem walking Coordination with finger-to-nose WNL Neg pronator drift   Skin: Skin is warm, dry and intact. No rash noted.  Psychiatric: He has a normal mood and affect.  Nursing note and vitals reviewed.    ED Treatments / Results  Labs (all labs ordered are listed, but only abnormal results are displayed) Labs Reviewed  CBC WITH DIFFERENTIAL/PLATELET - Abnormal; Notable for the  following components:      Result Value   RBC 3.50 (*)    Hemoglobin 11.1 (*)    HCT 32.7 (*)    RDW 16.3 (*)    All other components within normal limits  COMPREHENSIVE METABOLIC PANEL - Abnormal; Notable for the following components:   Creatinine, Ser 1.62 (*)    Calcium 8.8 (*)    Albumin 3.2 (*)    AST 64 (*)    Total Bilirubin 1.3 (*)    GFR calc non Af Amer 48 (*)    GFR calc Af Amer 56 (*)    All other components within normal limits  URINALYSIS, ROUTINE W REFLEX MICROSCOPIC - Abnormal; Notable for the following components:   Ketones, ur 5 (*)    All other components within normal limits  LIPASE, BLOOD    EKG None  Radiology US Abdomen Complete  Result Date: 12/08/2017 CLINICAL DATA:  Upper abdominal pain EXAM: ABDOMEN ULTRASOUND COMPLETE COMPARISON:  July 17, 2016 FINDINGS: Gallbladder: No gallstones or wall thickening visualized. There is no pericholecystic fluid. No sonographic Murphy sign noted by sonographer. Common bile duct: Diameter: 5 mm. No intrahepatic, common hepatic, or common bile duct dilatation. Liver: No focal lesion identified. Liver echogenicity overall is increased. Portal vein is patent on color Doppler imaging with normal direction of blood flow towards the liver. IVC: No abnormality visualized. Pancreas: No pancreatic mass or inflammatory focus. Spleen: Size and appearance within normal limits. Right Kidney: Length: 11.5 cm. Echogenicity within normal limits. No mass or hydronephrosis visualized. Left Kidney: Length: 10.5 cm. Echogenicity within normal limits. No mass or hydronephrosis visualized. Abdominal aorta: No aneurysm visualized. Other findings: No demonstrable ascites. IMPRESSION: Increase in liver echogenicity, a finding felt to be indicative of hepatic steatosis. While no focal liver lesions are evident on this study, it must be cautioned that the sensitivity of ultrasound for detection of focal liver lesions is diminished in this circumstance.  Study otherwise unremarkable. Electronically Signed   By: Bretta Bang III M.D.   On: 12/08/2017 15:55    Procedures Procedures (including critical care time)  Medications Ordered in ED Medications  metoCLOPramide (REGLAN) injection 10 mg (10 mg Intravenous Given 12/08/17 1439)    And  diphenhydrAMINE (BENADRYL) injection 25 mg (25 mg Intravenous Given 12/08/17 1436)    And  sodium chloride 0.9 % bolus 1,000 mL (1,000 mLs Intravenous New Bag/Given 12/08/17 1442)    And  ketorolac (TORADOL) 30 MG/ML injection 15 mg (15 mg Intravenous Given 12/08/17 1441)  gi cocktail (Maalox,Lidocaine,Donnatal) (30 mLs Oral Given 12/08/17 1442)  famotidine (PEPCID) IVPB 20 mg premix ( Intravenous Stopped 12/08/17 1515)     Initial Impression / Assessment and Plan / ED Course  I have reviewed  the triage vital signs and the nursing notes.  Pertinent labs & imaging results that were available during my care of the patient were reviewed by me and considered in my medical decision making (see chart for details).     52 y.o. male here with 5 days of migraine headache, lightheadedness, n/v, b/l hands/feet tingling, and sinus congestion/rhinorrhea. On exam, no focal neuro deficits, mild R paracervical muscle TTP and spasms extending into the trapezius area, mild nasal congestion and maxillary sinus TTP; abd with mild epigastric and RUQ TTP, pain elicited with murphy's but pt able to fully inspire, nonperitoneal otherwise. I suspect the headache is due to sinusitis, probably viral; doubt emergent secondary etiology of his headache, doubt need for head imaging. 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 Regarding the n/v, could be related to GERD/PUD/gastritis vs gallbladder pathology. Will get labs and abd U/S. Will give migraine cocktail, GI cocktail, and pepcid, then reassess shortly.   5:55  PM CBC w/diff with marginal anemia but otherwise unremarakble. CMP with stable kidney function Cr 1.62, marginally elevated TBili 1.3 and AST 64 but otherwise remainder unremarakble. Lipase WNL. U/A WNL. Abd U/S with hepatic steatosis but otherwise unremarkable. Pt feeling better and tolerating PO well after meds. Overall, migraine/headache likely from sinus congestion, advised OTC remedies for this; n/v/abd tenderness likely from gastritis from alcohol use. Discussed diet/lifestyle modifications for symptoms, alcohol cessation strongly encouraged, will start on zantac, give reglan for headaches and nausea, advised tylenol and avoidance/sparing use of NSAIDs only on full stomach, discussed other OTC remedies for symptomatic relief, and f/up with PCP in 5-7 days for recheck of symptoms and ongoing evaluation/management. I explained the diagnosis and have given explicit precautions to return to the ER including for any other new or worsening symptoms. The patient understands and accepts the medical plan as it's been dictated and I have answered their questions. Discharge instructions concerning home care and prescriptions have been given. The patient is STABLE and is discharged to home in good condition.    Final Clinical Impressions(s) / ED Diagnoses   Final diagnoses:  Migraine without status migrainosus, not intractable, unspecified migraine type  Acute sinusitis, recurrence not specified, unspecified location  Nausea and vomiting in adult patient  Right upper quadrant abdominal tenderness without rebound tenderness  Acute alcoholic gastritis, presence of bleeding unspecified  Hepatic steatosis  Alcohol use    ED Discharge Orders        Ordered    metoCLOPramide (REGLAN) 10 MG tablet  Every 6 hours PRN     12/08/17 1755    ranitidine (ZANTAC) 150 MG tablet  2 times daily     12/08/17 [address]1755       Chyna Kneece, [place], [place]PA-C 12/08/17 1756    Tilden Fossaees, Elizabeth, MD 12/09/17 1329

## 2017-12-08 NOTE — ED Triage Notes (Addendum)
Patient reports migraine since Thursday with intermittent N/V and light and sound sensitivity. States no history of same and has not tried to take anything PTA. Neuro intact. Patient adds that he drinks multiple beers per day, last one around 6 or 7pm.

## 2018-07-20 ENCOUNTER — Encounter (HOSPITAL_COMMUNITY): Payer: Self-pay | Admitting: Family Medicine

## 2018-07-20 ENCOUNTER — Emergency Department (HOSPITAL_COMMUNITY)
Admission: EM | Admit: 2018-07-20 | Discharge: 2018-07-21 | Disposition: A | Discharge status: Other Acute Inpatient Hospital | Payer: Self-pay | Attending: Emergency Medicine | Admitting: Emergency Medicine

## 2018-07-20 DIAGNOSIS — T50902A Poisoning by unspecified drugs, medicaments and biological substances, intentional self-harm, initial encounter: Secondary | ICD-10-CM

## 2018-07-20 DIAGNOSIS — I2510 Atherosclerotic heart disease of native coronary artery without angina pectoris: Secondary | ICD-10-CM | POA: Insufficient documentation

## 2018-07-20 DIAGNOSIS — F250 Schizoaffective disorder, bipolar type: Secondary | ICD-10-CM | POA: Insufficient documentation

## 2018-07-20 DIAGNOSIS — Y9389 Activity, other specified: Secondary | ICD-10-CM | POA: Insufficient documentation

## 2018-07-20 DIAGNOSIS — F17290 Nicotine dependence, other tobacco product, uncomplicated: Secondary | ICD-10-CM | POA: Insufficient documentation

## 2018-07-20 DIAGNOSIS — I252 Old myocardial infarction: Secondary | ICD-10-CM | POA: Insufficient documentation

## 2018-07-20 DIAGNOSIS — F319 Bipolar disorder, unspecified: Secondary | ICD-10-CM | POA: Insufficient documentation

## 2018-07-20 DIAGNOSIS — Y929 Unspecified place or not applicable: Secondary | ICD-10-CM | POA: Insufficient documentation

## 2018-07-20 DIAGNOSIS — T1491XA Suicide attempt, initial encounter: Secondary | ICD-10-CM | POA: Insufficient documentation

## 2018-07-20 DIAGNOSIS — Y998 Other external cause status: Secondary | ICD-10-CM | POA: Insufficient documentation

## 2018-07-20 DIAGNOSIS — R45851 Suicidal ideations: Secondary | ICD-10-CM | POA: Insufficient documentation

## 2018-07-20 DIAGNOSIS — X838XXA Intentional self-harm by other specified means, initial encounter: Secondary | ICD-10-CM | POA: Insufficient documentation

## 2018-07-20 LAB — RAPID URINE DRUG SCREEN, HOSP PERFORMED
AMPHETAMINES: NOT DETECTED
BENZODIAZEPINES: NOT DETECTED
Barbiturates: NOT DETECTED
COCAINE: NOT DETECTED
OPIATES: NOT DETECTED
Tetrahydrocannabinol: POSITIVE — AB

## 2018-07-20 LAB — COMPREHENSIVE METABOLIC PANEL
ALT: 13 U/L (ref 0–44)
AST: 20 U/L (ref 15–41)
Albumin: 3.8 g/dL (ref 3.5–5.0)
Alkaline Phosphatase: 77 U/L (ref 38–126)
Anion gap: 8 (ref 5–15)
BUN: 13 mg/dL (ref 6–20)
CHLORIDE: 100 mmol/L (ref 98–111)
CO2: 28 mmol/L (ref 22–32)
CREATININE: 1.22 mg/dL (ref 0.61–1.24)
Calcium: 8.9 mg/dL (ref 8.9–10.3)
GFR calc non Af Amer: >60 mL/min (ref >60)
Glucose, Bld: 85 mg/dL (ref 70–99)
POTASSIUM: 3.6 mmol/L (ref 3.5–5.1)
SODIUM: 136 mmol/L (ref 135–145)
Total Bilirubin: 0.8 mg/dL (ref 0.3–1.2)
Total Protein: 7.3 g/dL (ref 6.5–8.1)

## 2018-07-20 LAB — CBC
HCT: 37.8 % — ABNORMAL LOW (ref 39.0–52.0)
Hemoglobin: 12.5 g/dL — ABNORMAL LOW (ref 13.0–17.0)
MCH: 30 pg (ref 26.0–34.0)
MCHC: 33.1 g/dL (ref 30.0–36.0)
MCV: 90.9 fL (ref 80.0–100.0)
NRBC: 0 % (ref 0.0–0.2)
Platelets: 233 10*3/uL (ref 150–400)
RBC: 4.16 MIL/uL — AB (ref 4.22–5.81)
RDW: 17 % — AB (ref 11.5–15.5)
WBC: 8.1 10*3/uL (ref 4.0–10.5)

## 2018-07-20 LAB — ETHANOL: Alcohol, Ethyl (B): <10 mg/dL (ref <10)

## 2018-07-20 LAB — CBG MONITORING, ED: Glucose-Capillary: 98 mg/dL (ref 70–99)

## 2018-07-20 LAB — SALICYLATE LEVEL: Salicylate Lvl: <7 mg/dL (ref 2.8–30.0)

## 2018-07-20 LAB — ACETAMINOPHEN LEVEL: Acetaminophen (Tylenol), Serum: <10 ug/mL — ABNORMAL LOW (ref 10–30)

## 2018-07-20 MED ORDER — TRAZODONE HCL 50 MG PO TABS: 50.0000 mg | ORAL_TABLET | Freq: Every evening | ORAL | Status: DC | PRN

## 2018-07-20 NOTE — ED Provider Notes (Signed)
[facility]-EMERGENCY DEPT Provider Note   CSN: [id number] Arrival date & time: 07/20/18  1303     History   Chief Complaint Chief Complaint  Patient presents with  . Psychiatric Evaluation  . Drug Overdose  . Migraine    HPI Dean Howard is a 53 y.o. male.  HPI Patient presents with concern of suicidal ideation, after a possible overdose attempt yesterday. Patient knowledges history of anxiety, psychiatric disease, labile mood. He notes that he has been increasingly frustrated over the past few days, took approximately 80 individual tablets of 81 mg aspirin yesterday. He has had a headache, notes this is unchanged in spite of taking that medication. No new weakness in his extremities, no new vomiting, no new confusion, disorientation. Today, with concern over yesterday's event he went to behavioral health, was sent here for medical evaluation.  Past Medical History:  Diagnosis Date  . Coronary artery disease   . MI, old   . Tick fever     Patient Active Problem List   Diagnosis Date Noted  . Bipolar 1 disorder, mixed, moderate (HCC)   . Adjustment disorder 07/20/2016  . MDD (major depressive disorder), recurrent, severe, with psychosis (HCC) 07/18/2016  . 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 CHEST PAIN, PLEURITIC 09/16/2007    Past Surgical History:  Procedure Laterality Date  . HERNIA REPAIR    . KNEE SURGERY          Home Medications    Prior to Admission medications   Medication Sig Start Date End Date Taking? Authorizing Provider  acetaminophen (TYLENOL) 500 MG tablet Take 1,000 mg by mouth 3 (three) times daily as needed for moderate pain.   Yes [provider]  albuterol (PROVENTIL) (2.5 MG/3ML) 0.083% nebulizer solution Take 2.5 mg by nebulization every 6 (six) hours as needed for wheezing or shortness of  breath.   Yes [provider]  aspirin-acetaminophen-caffeine (EXCEDRIN MIGRAINE) [phone] MG tablet Take 2 tablets by mouth every 6 (six) hours as needed for headache.   Yes [provider]  HYDROcodone-acetaminophen (NORCO) 5-325 MG tablet Take 1-2 tablets by mouth every 6 (six) hours as needed for severe pain. Patient not taking: Reported on 07/20/2018 10/02/16   Molpus, Jonny Ruiz, MD  traZODone (DESYREL) 50 MG tablet Take 1 tablet (50 mg total) by mouth at bedtime as needed for sleep. Patient not taking: Reported on 07/20/2018 07/24/16   Adonis Brook, NP    Family History History reviewed. No pertinent family history.  Social History Social History   Tobacco Use  . Smoking status: Current Every Day Smoker    Types: Cigars    Last attempt to quit: 03/04/2016    Years since quitting: 2.3  . Smokeless tobacco: Never Used  Substance Use Topics  . Alcohol use: Yes    Comment: Daily   . Drug use: Yes    Types: Marijuana    Comment: Daily      Allergies   Patient has no known allergies.   Review of Systems Review of Systems  Constitutional:       Per HPI, otherwise negative  HENT:       Per HPI, otherwise negative  Respiratory:       Per HPI, otherwise negative  Cardiovascular:       Per HPI, otherwise negative  Gastrointestinal: Negative for vomiting.  Endocrine:  Negative aside from HPI  Genitourinary:       Neg aside from HPI   Musculoskeletal:       Per HPI, otherwise negative  Skin: Negative.   Neurological: Negative for syncope.  Psychiatric/Behavioral: Positive for dysphoric mood and suicidal ideas. The patient is nervous/anxious.      Physical Exam Updated Vital Signs BP 115/77 (BP Location: Left Arm)   Pulse 80   Temp 98.6 F (37 C) (Oral)   Resp 20   Ht 6\' 3"  (1.905 m)   Wt 76.7 kg   SpO2 95%   BMI 21.12 kg/m   Physical Exam Vitals signs and nursing note reviewed.  Constitutional:      General: He is not in acute  distress.    Appearance: He is well-developed.  HENT:     Head: Normocephalic and atraumatic.  Eyes:     Conjunctiva/sclera: Conjunctivae normal.  Cardiovascular:     Rate and Rhythm: Normal rate and regular rhythm.  Pulmonary:     Effort: Pulmonary effort is normal. No respiratory distress.     Breath sounds: No stridor.  Abdominal:     General: There is no distension.  Skin:    General: Skin is warm and dry.  Neurological:     Mental Status: He is alert and oriented to person, place, and time.  Psychiatric:        Mood and Affect: Mood is anxious.        Thought Content: Thought content includes suicidal ideation.     Comments: Some insight into his current presentation.      ED Treatments / Results  Labs (all labs ordered are listed, but only abnormal results are displayed) Labs Reviewed  ACETAMINOPHEN LEVEL - Abnormal; Notable for the following components:      Result Value   Acetaminophen (Tylenol), Serum <10 (*)    All other components within normal limits  CBC - Abnormal; Notable for the following components:   RBC 4.16 (*)    Hemoglobin 12.5 (*)    HCT 37.8 (*)    RDW 17.0 (*)    All other components within normal limits  COMPREHENSIVE METABOLIC PANEL  ETHANOL  SALICYLATE LEVEL  RAPID URINE DRUG SCREEN, HOSP PERFORMED  CBG MONITORING, ED    EKG EKG Interpretation  Date/Time:  Tuesday July 20 2018 15:03:34 EST Ventricular Rate:  73 PR Interval:    QRS Duration: 91 QT Interval:  419 QTC Calculation: 462 R Axis:   -57 Text Interpretation:  Sinus rhythm Consider left atrial enlargement Left anterior fascicular block T wave abnormality Abnormal ekg Confirmed by Gerhard MunchLockwood, Mylo Driskill [phone]([id number]) on 07/20/2018 3:08:59 PM   Procedures Procedures (including critical care time)  Medications Ordered in ED Medications - No data to display   Initial Impression / Assessment and Plan / ED Course  I have reviewed the triage vital signs and the nursing  notes.  Pertinent labs & imaging results that were available during my care of the patient were reviewed by me and considered in my medical decision making (see chart for details).     3:31 PM Patient remains in no distress. Labs reassuring, salicylate level normal. Patient medically cleared for further psychiatric evaluation given his description of overdose attempt yesterday, ongoing suicidal ideation, and psychiatric history. Without physical complaints, patient is hemodynamically stable, labs unremarkable, patient appropriate for ongoing eval with our behavioral health team.  Final Clinical Impressions(s) / ED Diagnoses  Overdose attempt Suicidal ideation   Gerhard MunchLockwood, Vickey Boak, MD  07/20/18 1532  

## 2018-07-20 NOTE — ED Notes (Signed)
Bed: WA20 Expected date:  Expected time:  Means of arrival:  Comments: [place]

## 2018-07-20 NOTE — ED Notes (Signed)
Bed: WA26 Expected date:  Expected time:  Means of arrival:  Comments: 
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
## 2018-07-20 NOTE — ED Notes (Signed)
Assumed care. Pt resting quietly in bed with eyes closed. Sitter at bedside. Pt in NAD at this time.

## 2018-07-20 NOTE — ED Triage Notes (Signed)
Patient is from [place] and transported via [facility]. Patient reports he took 48 ASA 81mg  tablets yesterday around 16:30-17:00. Patient he went to [facility] this morning for help with his suicidal thoughts and depression. Also, reports he has had a migraine for the last 4 days with sinus pressure and congestion. Associated symptoms of dizziness, lightheadedness, blurry vision, and intermittent nose bleeding around 20 minutes.

## 2018-07-21 ENCOUNTER — Encounter (HOSPITAL_COMMUNITY): Payer: Self-pay | Admitting: *Deleted

## 2018-07-21 ENCOUNTER — Other Ambulatory Visit: Payer: Self-pay

## 2018-07-21 ENCOUNTER — Inpatient Hospital Stay (HOSPITAL_COMMUNITY)
Admission: AD | Admit: 2018-07-21 | Discharge: 2018-07-26 | DRG: 885 | Disposition: A | Discharge status: Home / Self Care | Payer: Federal, State, Local not specified - Other | Source: Intra-hospital | Attending: Psychiatry | Admitting: Psychiatry

## 2018-07-21 DIAGNOSIS — J42 Unspecified chronic bronchitis: Secondary | ICD-10-CM | POA: Diagnosis present

## 2018-07-21 DIAGNOSIS — Z79899 Other long term (current) drug therapy: Secondary | ICD-10-CM

## 2018-07-21 DIAGNOSIS — Z7982 Long term (current) use of aspirin: Secondary | ICD-10-CM | POA: Diagnosis not present

## 2018-07-21 DIAGNOSIS — F1020 Alcohol dependence, uncomplicated: Secondary | ICD-10-CM | POA: Diagnosis present

## 2018-07-21 DIAGNOSIS — G43909 Migraine, unspecified, not intractable, without status migrainosus: Secondary | ICD-10-CM | POA: Diagnosis present

## 2018-07-21 DIAGNOSIS — F250 Schizoaffective disorder, bipolar type: Secondary | ICD-10-CM | POA: Diagnosis present

## 2018-07-21 DIAGNOSIS — G4730 Sleep apnea, unspecified: Secondary | ICD-10-CM | POA: Diagnosis present

## 2018-07-21 DIAGNOSIS — F17290 Nicotine dependence, other tobacco product, uncomplicated: Secondary | ICD-10-CM | POA: Diagnosis present

## 2018-07-21 DIAGNOSIS — R000 Tachycardia, unspecified: Secondary | ICD-10-CM | POA: Diagnosis not present

## 2018-07-21 DIAGNOSIS — J069 Acute upper respiratory infection, unspecified: Secondary | ICD-10-CM | POA: Diagnosis present

## 2018-07-21 DIAGNOSIS — F258 Other schizoaffective disorders: Secondary | ICD-10-CM | POA: Diagnosis not present

## 2018-07-21 DIAGNOSIS — I252 Old myocardial infarction: Secondary | ICD-10-CM

## 2018-07-21 DIAGNOSIS — G4700 Insomnia, unspecified: Secondary | ICD-10-CM | POA: Diagnosis present

## 2018-07-21 DIAGNOSIS — F1220 Cannabis dependence, uncomplicated: Secondary | ICD-10-CM | POA: Diagnosis present

## 2018-07-21 DIAGNOSIS — I2510 Atherosclerotic heart disease of native coronary artery without angina pectoris: Secondary | ICD-10-CM | POA: Diagnosis present

## 2018-07-21 DIAGNOSIS — R45851 Suicidal ideations: Secondary | ICD-10-CM | POA: Diagnosis present

## 2018-07-21 DIAGNOSIS — F259 Schizoaffective disorder, unspecified: Secondary | ICD-10-CM | POA: Diagnosis present

## 2018-07-21 DIAGNOSIS — Z23 Encounter for immunization: Secondary | ICD-10-CM

## 2018-07-21 MED ORDER — TRAZODONE HCL 50 MG PO TABS
50.0000 mg | ORAL_TABLET | Freq: Every evening | ORAL | Status: DC | PRN
  Administered 2018-07-21 – 2018-07-22 (×2): 50 mg via ORAL
  Filled 2018-07-21 (×9): qty 1

## 2018-07-21 MED ORDER — ACETAMINOPHEN 325 MG PO TABS
650.0000 mg | ORAL_TABLET | Freq: Four times a day (QID) | ORAL | Status: DC | PRN
  Administered 2018-07-21: 650 mg via ORAL
  Filled 2018-07-21: qty 2

## 2018-07-21 MED ORDER — MENTHOL 3 MG MT LOZG
1.0000 | LOZENGE | OROMUCOSAL | Status: DC | PRN
  Administered 2018-07-21: 3 mg via ORAL

## 2018-07-21 MED ORDER — DIPHENHYDRAMINE HCL 25 MG PO CAPS
50.0000 mg | ORAL_CAPSULE | Freq: Four times a day (QID) | ORAL | Status: DC | PRN
  Administered 2018-07-21: 50 mg via ORAL
  Filled 2018-07-21: qty 2

## 2018-07-21 MED ORDER — BENZONATATE 100 MG PO CAPS
200.0000 mg | ORAL_CAPSULE | Freq: Three times a day (TID) | ORAL | Status: DC | PRN
  Administered 2018-07-21 – 2018-07-25 (×5): 200 mg via ORAL
  Filled 2018-07-21 (×6): qty 2

## 2018-07-21 MED ORDER — INFLUENZA VAC SPLIT QUAD 0.5 ML IM SUSY
0.5000 mL | PREFILLED_SYRINGE | INTRAMUSCULAR | Status: AC
  Administered 2018-07-23: 0.5 mL via INTRAMUSCULAR
  Filled 2018-07-21: qty 0.5

## 2018-07-21 MED ORDER — ALUM & MAG HYDROXIDE-SIMETH 200-200-20 MG/5ML PO SUSP: 30.0000 mL | ORAL | Status: DC | PRN

## 2018-07-21 MED ORDER — GUAIFENESIN 100 MG/5ML PO SOLN
10.0000 mL | Freq: Once | ORAL | Status: AC
  Administered 2018-07-21: 200 mg via ORAL
  Filled 2018-07-21: qty 10

## 2018-07-21 MED ORDER — MAGNESIUM HYDROXIDE 400 MG/5ML PO SUSP: 30.0000 mL | Freq: Every day | ORAL | Status: DC | PRN

## 2018-07-21 NOTE — BH Assessment (Signed)
[facility] Assessment Progress Note  Per Caryn Bee PMHNP, this pt requires psychiatric hospitalization at this time.  Selena Batten, RN, [facility] has assigned pt to [facility] Rm [id number]; [facility] will be ready to receive pt at 12:00.  Pt has signed Voluntary Admission and Consent for Treatment, as well as Consent to Release Information to [facility], and a notification call has been placed.  Signed forms have been faxed to [facility].  Pt's, Morrie Sheldon, nurse has been notified, and agrees to send original paperwork along with pt via Juel Burrow, and to call report to [phone].  Doylene Canning, [place] [facility] Coordinator [phone]

## 2018-07-21 NOTE — ED Notes (Signed)
ED TO INPATIENT HANDOFF REPORT  Name/Age/Gender Dean Howard E Dombrosky 53 y.o. male  Code Status    Code Status Orders  (From admission, onward)         Start     Ordered   07/20/18 1534  Full code  Continuous     07/20/18 1533        Code Status History    Date Active Date Inactive Code Status Order ID Comments User Context   07/20/2016 1903 07/24/2016 2016 Full Code [id number]  Levin BaconReddick, Heather V, RN Inpatient   07/17/2016 2255 07/20/2016 1557 Full Code [id number]  Linwood DibblesKnapp, Jon, MD ED   06/03/2016 1937 06/04/2016 1628 Full Code [id number]  Derwood KaplanNanavati, Ankit, MD ED      Home/SNF/Other Home  Chief Complaint si;migraine;possible od  Level of Care/Admitting Diagnosis ED Disposition    ED Disposition Condition Comment   Transfer to Another [facility]  [facility] accepting physician Dr.Farah. May arrive after 12pm.       Medical History Past Medical History:  Diagnosis Date  . Coronary artery disease   . MI, old   . Tick fever     Allergies No Known Allergies  IV Location/Drains/Wounds Patient Lines/Drains/Airways Status   Active Line/Drains/Airways    None          Labs/Imaging Results for orders placed or performed during the hospital encounter of 07/20/18 (from the past 48 hour(s))  Comprehensive metabolic panel     Status: None   Collection Time: 07/20/18  2:14 PM  Result Value Ref Range   Sodium 136 135 - 145 mmol/L   Potassium 3.6 3.5 - 5.1 mmol/L   Chloride 100 98 - 111 mmol/L   CO2 28 22 - 32 mmol/L   Glucose, Bld 85 70 - 99 mg/dL   BUN 13 6 - 20 mg/dL   Creatinine, Ser 0.861.22 0.61 - 1.24 mg/dL   Calcium 8.9 8.9 - 57.810.3 mg/dL   Total Protein 7.3 6.5 - 8.1 g/dL   Albumin 3.8 3.5 - 5.0 g/dL   AST 20 15 - 41 U/L   ALT 13 0 - 44 U/L   Alkaline Phosphatase 77 38 - 126 U/L   Total Bilirubin 0.8 0.3 - 1.2 mg/dL   GFR calc non Af Amer >60 >60 mL/min   GFR calc Af Amer >60 >60 mL/min   Anion gap 8 5 - 15    Comment: Performed at [facility],  [address] [place], [place] [address]  Ethanol     Status: None   Collection Time: 07/20/18  2:14 PM  Result Value Ref Range   Alcohol, Ethyl (B) <10 <10 mg/dL    Comment: (NOTE) Lowest detectable limit for serum alcohol is 10 mg/dL. For medical purposes only. Performed at [facility], [address] [place], [place] [address]   Salicylate level     Status: None   Collection Time: 07/20/18  2:14 PM  Result Value Ref Range   Salicylate Lvl <7.0 2.8 - 30.0 mg/dL    Comment: Performed at [facility], [address] [place], [place] [address]  Acetaminophen level     Status: Abnormal   Collection Time: 07/20/18  2:14 PM  Result Value Ref Range   Acetaminophen (Tylenol), Serum <10 (L) 10 - 30 ug/mL    Comment: (NOTE) Therapeutic concentrations vary significantly. A range of 10-30 ug/mL  may be an effective concentration for many patients. However, some  are best treated at concentrations  outside of this range. Acetaminophen concentrations >150 ug/mL at 4 hours after ingestion  and >50 ug/mL at 12 hours after ingestion are often associated with  toxic reactions. Performed at [facility], [address] [place], [place] [address]   cbc     Status: Abnormal   Collection Time: 07/20/18  2:14 PM  Result Value Ref Range   WBC 8.1 4.0 - 10.5 K/uL   RBC 4.16 (L) 4.22 - 5.81 MIL/uL   Hemoglobin 12.5 (L) 13.0 - 17.0 g/dL   HCT 78.237.8 (L) 95.639.0 - 21.352.0 %   MCV 90.9 80.0 - 100.0 fL   MCH 30.0 26.0 - 34.0 pg   MCHC 33.1 30.0 - 36.0 g/dL   RDW 08.617.0 (H) 57.811.5 - 46.915.5 %   Platelets 233 150 - 400 K/uL   nRBC 0.0 0.0 - 0.2 %    Comment: Performed at [facility], [address] [place], [place] [address]  CBG monitoring, ED     Status: None   Collection Time: 07/20/18  2:57 PM  Result Value Ref Range   Glucose-Capillary 98 70 - 99 mg/dL  Rapid urine drug screen (hospital performed)     Status: Abnormal    Collection Time: 07/20/18  5:33 PM  Result Value Ref Range   Opiates NONE DETECTED NONE DETECTED   Cocaine NONE DETECTED NONE DETECTED   Benzodiazepines NONE DETECTED NONE DETECTED   Amphetamines NONE DETECTED NONE DETECTED   Tetrahydrocannabinol POSITIVE (A) NONE DETECTED   Barbiturates NONE DETECTED NONE DETECTED    Comment: (NOTE) DRUG SCREEN FOR MEDICAL PURPOSES ONLY.  IF CONFIRMATION IS NEEDED FOR ANY PURPOSE, NOTIFY LAB WITHIN 5 DAYS. LOWEST DETECTABLE LIMITS FOR URINE DRUG SCREEN Drug Class                     Cutoff (ng/mL) Amphetamine and metabolites    1000 Barbiturate and metabolites    200 Benzodiazepine                 200 Tricyclics and metabolites     300 Opiates and metabolites        300 Cocaine and metabolites        300 THC                            50 Performed at [facility], [address] [place], [place] [address]    No results found. EKG Interpretation  Date/Time:  Tuesday July 20 2018 15:03:34 EST Ventricular Rate:  73 PR Interval:    QRS Duration: 91 QT Interval:  419 QTC Calculation: 462 R Axis:   -57 Text Interpretation:  Sinus rhythm Consider left atrial enlargement Left anterior fascicular block T wave abnormality Abnormal ekg Confirmed by Gerhard MunchLockwood, Robert [phone]([id number]) on 07/20/2018 3:08:59 PM Also confirmed by Gerhard MunchLockwood, Robert ([id number]), editor Barbette HairCassel, Kerry [phone]([id number])  on 07/21/2018 7:06:44 AM   Pending Labs Unresulted Labs (From admission, onward)   None      Vitals/Pain Today's Vitals   07/20/18 2037 07/21/18 0620 07/21/18 0826 07/21/18 1132  BP: 130/83 117/83  125/88  Pulse: 76 75  60  Resp: 18 16  16   Temp: 98.2 F (36.8 C) 98.7 F (37.1 C)  98.6 F (37 C)  TempSrc: Oral Oral  Oral  SpO2: 99% 97%  99%  Weight:      Height:      PainSc:   3  Isolation Precautions No active isolations  Medications Medications  traZODone (DESYREL) tablet 50 mg (has no administration in time range)  acetaminophen  (TYLENOL) tablet 650 mg (650 mg Oral Given 07/21/18 0800)  guaiFENesin (ROBITUSSIN) 100 MG/5ML solution 200 mg (200 mg Oral Given 07/21/18 0707)    Mobility walks

## 2018-07-21 NOTE — Tx Team (Signed)
Initial Treatment Plan 07/21/2018 6:33 PM Dean Howard GEZ:[id number]    PATIENT STRESSORS: Health problems Medication change or noncompliance Substance abuse   PATIENT STRENGTHS: Ability for insight Average or above average intelligence Capable of independent living General fund of knowledge   PATIENT IDENTIFIED PROBLEMS: Depression Suicidal thoughts Auditory hallucinations Substance abuse "Think I need an upgrade on my medicine"                     DISCHARGE CRITERIA:  Ability to meet basic life and health needs Improved stabilization in mood, thinking, and/or behavior Reduction of life-threatening or endangering symptoms to within safe limits Verbal commitment to aftercare and medication compliance  PRELIMINARY DISCHARGE PLAN: Attend aftercare/continuing care group Return to previous living arrangement  PATIENT/FAMILY INVOLVEMENT: This treatment plan has been presented to and reviewed with the patient, Dean Howard, and/or family member, .  The patient and family have been given the opportunity to ask questions and make suggestions.  Christel Bai, [place], [place] 07/21/2018, 6:33 PM

## 2018-07-21 NOTE — ED Notes (Signed)
Spoke with Fransico Michael, RN [facility]) at [facility]. AC reported that pt has been accepted at [facility] room 501-1. Pt should be transferred at noon. Report to be called in AM prior to pt's transfer. Number to call report is [phone]. Will report info to on-coming RN.

## 2018-07-21 NOTE — Progress Notes (Signed)
Dean Howard is a 53 year old male pt admitted on voluntary basis. On admission, he reports that he has been feeling suicidal, depressed and has been having auditory hallucinations. He reports that he took overdose a couple days ago and is currently suicidal on admission but able to contract for safety on the unit. He does endorse daily marijuana and alcohol usage. He reports that he was taking medications but reports that he is unable to afford his medications. He reports that he is currently working on getting disability but is unsure how he can pay for his medications upon discharge. He reports that he lives with his mother and sister and reports that he will return there once he is discharged. Brom was escorted to the unit, oriented to the milieu and safety maintained.

## 2018-07-21 NOTE — Progress Notes (Signed)
Did not attend group 

## 2018-07-21 NOTE — Progress Notes (Signed)
D:  Dean Howard was in his room/in bed on initial approach.  He reported that he hasn't been feeling well reporting having cough, sore throat and nasal congestion for the past three days.  Notified Spencer PA and new orders noted.  He took hs medications without difficulty along with tessalon pearls, benadryl and sore throat lozenge.  He returned promptly to his room to lay down.  He denied SI/HI or A/V hallucinations.  He did not attend evening wrap up group.  He is currently resting with his eyes closed and appears to be asleep. A:  1:1 with RN for support and encouragement.  Medications as ordered.  Q 15 minute checks maintained for safety.  Encouraged participation in group and unit activities.   R:  Dean Howard remains safe on the unit.  We will continue to monitor the progress towards his goals.

## 2018-07-21 NOTE — ED Notes (Signed)
[facility] RETURNED CALL. DELAY IN TRANSPORT TO PICK UP PT.

## 2018-07-21 NOTE — ED Notes (Signed)
BELONGING BAG X 3 GIVEN TO WALTER FROM [facility]

## 2018-07-21 NOTE — ED Notes (Signed)
PT AWARE OF TRANSPORT BY [facility] TO [facility]. REPORT GIVEN.

## 2018-07-21 NOTE — ED Notes (Signed)
Dean Chamber, NP recommends inpatient treatment.  Dean Howard, patient accepted to [facility] Room 501 Bed1. Arrival time 12noon. Attending is Dr. Jola Babinski. RN notified of disposition and acceptance, call nurse to nurse report [facility] [phone].

## 2018-07-22 DIAGNOSIS — F258 Other schizoaffective disorders: Secondary | ICD-10-CM

## 2018-07-22 MED ORDER — LITHIUM CARBONATE ER 450 MG PO TBCR
450.0000 mg | EXTENDED_RELEASE_TABLET | Freq: Two times a day (BID) | ORAL | Status: DC
  Administered 2018-07-22 – 2018-07-26 (×9): 450 mg via ORAL
  Filled 2018-07-22 (×3): qty 1
  Filled 2018-07-22: qty 14
  Filled 2018-07-22 (×4): qty 1
  Filled 2018-07-22: qty 14
  Filled 2018-07-22 (×4): qty 1

## 2018-07-22 MED ORDER — CARIPRAZINE HCL 3 MG PO CAPS
3.0000 mg | ORAL_CAPSULE | Freq: Every day | ORAL | Status: DC
  Administered 2018-07-22 – 2018-07-24 (×3): 3 mg via ORAL
  Filled 2018-07-22 (×5): qty 1

## 2018-07-22 MED ORDER — TEMAZEPAM 15 MG PO CAPS
30.0000 mg | ORAL_CAPSULE | Freq: Every day | ORAL | Status: DC
  Administered 2018-07-22 – 2018-07-25 (×4): 30 mg via ORAL
  Filled 2018-07-22 (×4): qty 2

## 2018-07-22 NOTE — Progress Notes (Signed)
Recreation Therapy Notes  INPATIENT RECREATION THERAPY ASSESSMENT  Patient Details Name: Dean Howard MRN: [id number] DOB: 03/01/1966 Today's Date: 07/22/2018       Information Obtained From: Patient  Able to Participate in Assessment/Interview: Yes  Patient Presentation: Alert  Reason for Admission (Per Patient): Suicide Attempt  Patient Stressors: Other (Comment)(Mouthy people, depression, lack of coping skills)  Coping Skills:   Self-Injury, Journal, Sports, TV, Arguments, Aggression, Music, Substance Abuse, Impulsivity, Talk, Prayer, Avoidance, Read, Hot Bath/Shower  Leisure Interests (2+):  Individual - Writing, Games - Cards, Sports - Basketball, Sports - Swimming, Individual - Other (Comment), Music - Listen(Cook)  Frequency of Recreation/Participation: Weekly  Awareness of Community Resources:  Yes  Community Resources:  Gym, Other (Comment)(Community Center)  Current Use: Yes  If no, Barriers?:    Expressed Interest in [employer] Information: No  [employer] of Residence:  Engineer, technical sales  Patient Main Form of Transportation: Other (Comment)(Friends)  Patient Strengths:  Writing; Dancing, Listen to music  Patient Identified Areas of Improvement:  Attitude  Patient Goal for Hospitalization:  "Change a lot of my ways"  Current SI (including self-harm):  No  Current HI:  Yes(6/7; Contact)  Current AVH: Yes(Hearing deceased friends telling him to get certain peope; seeing grandmother in rocking chair and ex-girlfriend waving good-bye to him as he is going home for the holidays)  Staff Intervention Plan: Group Attendance, Collaborate with Interdisciplinary Treatment Team  Consent to Intern Participation: N/A     Caroll Rancher, LRT/CTRS   Caroll Rancher A 07/22/2018, 12:01 PM
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
## 2018-07-22 NOTE — [facility] Suicide Risk Assessment (Signed)
[name]   Nursing information obtained from:  Patient Demographic factors:  Male, Low socioeconomic status, Unemployed Current Mental Status:  Suicidal ideation indicated by patient, Self-harm thoughts Loss Factors:  Decrease in vocational status, Financial problems / change in socioeconomic status Historical Factors:  Prior suicide attempts, Family history of mental illness or substance abuse Risk Reduction Factors:  Living with another person, especially a relative, Positive coping skills or problem solving skills  Total Time spent with patient: 45 minutes Principal Problem: schizoaffective exacerbation Diagnosis:  Active Problems:   Schizo-affective schizophrenia, chronic condition with acute exacerbation (HCC)  Subjective Data: Rambling pressured somewhat hypomanic general dangerousness  Continued Clinical Symptoms:  Alcohol Use Disorder Identification Test Final Score (AUDIT): 9 The "Alcohol Use Disorders Identification Test", Guidelines for Use in Primary Care, Second Edition.  World Science writerHealth Organization [facility](WHO). Score between 0-7:  no or low risk or alcohol related problems. Score between 8-15:  moderate risk of alcohol related problems. Score between 16-19:  high risk of alcohol related problems. Score 20 or above:  warrants further diagnostic evaluation for alcohol dependence and treatment.   CLINICAL FACTORS:   Bipolar Disorder:   Mixed State   Musculoskeletal: Strength & Muscle Tone: within normal limits Gait & Station: normal Patient leans: N/A Tics-  COGNITIVE FEATURES THAT CONTRIBUTE TO RISK:  Polarized thinking    SUICIDE RISK:   Minimal: No identifiable suicidal ideation.  Patients presenting with no risk factors but with morbid ruminations; may be classified as minimal risk based on the severity of the depressive symptoms  PLAN OF CARE: see orders  I certify that inpatient services furnished can reasonably be expected to improve  the patient's condition.   Malvin JohnsFARAH,[name] Maruyama, MD 07/22/2018, 11:20 AM

## 2018-07-22 NOTE — Progress Notes (Signed)
Recreation Therapy Notes  Date: 1.30.20 Time: 0945 Location: [address] Dayroom  Group Topic: Wellness  Goal Area(s) Addresses:  Patient will define components of whole wellness. Patient will verbalize benefit of whole wellness.  Intervention:  Music  Activity:  Exercise.  LRT and patients went through a series of stretches.  Patients were then allowed to lead the group in an exercise of their choice.  Patients were allowed to rest when needed and take water breaks as needed.  Education: Wellness, Building control surveyor.   Education Outcome: Acknowledges education/In group clarification offered/Needs additional education.   Clinical Observations/Feedback: Pt did not attend group.    Caroll Rancher, LRT/CTRS     Caroll Rancher A 07/22/2018 11:31 AM
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
## 2018-07-22 NOTE — Progress Notes (Signed)
Adult Psychoeducational Group Note  Date:  07/22/2018 Time:  8:36 PM  Group Topic/Focus:  Wrap-Up Group:   The focus of this group is to help patients review their daily goal of treatment and discuss progress on daily workbooks.  Participation Level:  Active  Participation Quality:  Appropriate  Affect:  Appropriate  Cognitive:  Appropriate  Insight: Appropriate  Engagement in Group:  Engaged  Modes of Intervention:  Discussion  Additional Comments:  The patient expressed that he rates today a 10.The patient also said that she attended group.  Octavio Manns 07/22/2018, 8:36 PM

## 2018-07-22 NOTE — Progress Notes (Signed)
D: Pt passive AH-at night a lot,  denies SI/HI/VH. Pt is pleasant and cooperative. Pt visible on the unit with peers.   A: Pt was offered support and encouragement. Pt was given scheduled medications. Pt was encourage to attend groups. Q 15 minute checks were done for safety.   R:Pt attends groups and interacts well with peers and staff. Pt is taking medication. Pt has no complaints.Pt receptive to treatment and safety maintained on unit.   Problem: Activity: Goal: Interest or engagement in activities will improve Outcome: Progressing   Problem: Coping: Goal: Ability to demonstrate self-control will improve Outcome: Progressing

## 2018-07-23 MED ORDER — TRAZODONE HCL 100 MG PO TABS
100.0000 mg | ORAL_TABLET | Freq: Every evening | ORAL | Status: DC | PRN
  Administered 2018-07-23 – 2018-07-25 (×3): 100 mg via ORAL
  Filled 2018-07-23: qty 7
  Filled 2018-07-23 (×2): qty 1

## 2018-07-23 NOTE — Plan of Care (Signed)
D: Pt denies SI/HI/AVH. Pt is pleasant and cooperative. Pt stated he was doing better. Pt was moved to a room with a hospital bed, due to pt appearing to cough at night when he lays flat. Pt head of bed elevated and it appeared to help pt sleep better.   A: Pt was offered support and encouragement. Pt was given scheduled medications. Pt was encourage to attend groups. Q 15 minute checks were done for safety.   R:Pt attends groups and interacts well with peers and staff. Pt is taking medication. Pt has no complaints.Pt receptive to treatment and safety maintained on unit.  Problem: Education: Goal: Emotional status will improve Outcome: Progressing   Problem: Education: Goal: Mental status will improve Outcome: Progressing   Problem: Activity: Goal: Sleeping patterns will improve Outcome: Progressing

## 2018-07-23 NOTE — Tx Team (Signed)
Interdisciplinary Treatment and Diagnostic Plan Update  07/23/2018 Time of Session: 10:50 Dean Howard MRN: [id number]  Principal Diagnosis: <principal problem not specified>  Secondary Diagnoses: Active Problems:   Schizo-affective schizophrenia, chronic condition with acute exacerbation (HCC)   Current Medications:  Current Facility-Administered Medications  Medication Dose Route Frequency Provider Last Rate Last Dose   alum & mag hydroxide-simeth (MAALOX/MYLANTA) 200-200-20 MG/5ML suspension 30 mL  30 mL Oral Q4H PRN Patriciaann Clan E, PA-C       benzonatate (TESSALON) capsule 200 mg  200 mg Oral TID PRN Patriciaann Clan E, PA-C   200 mg at 07/22/18 2101   cariprazine (VRAYLAR) capsule 3 mg  3 mg Oral Daily Johnn Hai, MD   3 mg at 07/23/18 0736   diphenhydrAMINE (BENADRYL) capsule 50 mg  50 mg Oral Q6H PRN Patriciaann Clan E, PA-C   50 mg at 07/21/18 2157   lithium carbonate (ESKALITH) CR tablet 450 mg  450 mg Oral Q12H Johnn Hai, MD   450 mg at 07/23/18 0736   magnesium hydroxide (MILK OF MAGNESIA) suspension 30 mL  30 mL Oral Daily PRN Laverle Hobby, PA-C       menthol-cetylpyridinium (CEPACOL) lozenge 3 mg  1 lozenge Oral PRN Patriciaann Clan E, PA-C   3 mg at 07/21/18 2157   temazepam (RESTORIL) capsule 30 mg  30 mg Oral QHS Johnn Hai, MD   30 mg at 07/22/18 2101   traZODone (DESYREL) tablet 50 mg  50 mg Oral QHS,MR X 1 Laverle Hobby, PA-C   50 mg at 07/22/18 2101   PTA Medications: Medications Prior to Admission  Medication Sig Dispense Refill Last Dose   acetaminophen (TYLENOL) 500 MG tablet Take 1,000 mg by mouth 3 (three) times daily as needed for moderate pain.   Past Week at Unknown time   albuterol (PROVENTIL) (2.5 MG/3ML) 0.083% nebulizer solution Take 2.5 mg by nebulization every 6 (six) hours as needed for wheezing or shortness of breath.   Past Week at Unknown time   aspirin-acetaminophen-caffeine (EXCEDRIN MIGRAINE) 250-250-65 MG tablet Take 2  tablets by mouth every 6 (six) hours as needed for headache.   Past Week at Unknown time   HYDROcodone-acetaminophen (NORCO) 5-325 MG tablet Take 1-2 tablets by mouth every 6 (six) hours as needed for severe pain. (Patient not taking: Reported on 07/20/2018) 10 tablet 0 Not Taking at Unknown time   traZODone (DESYREL) 50 MG tablet Take 1 tablet (50 mg total) by mouth at bedtime as needed for sleep. (Patient not taking: Reported on 07/20/2018) 30 tablet 0 Not Taking at Unknown time    Patient Stressors: Health problems Medication change or noncompliance Substance abuse  Patient Strengths: Ability for insight Average or above average intelligence Capable of independent living General fund of knowledge  Treatment Modalities: Medication Management, Group therapy, Case management,  1 to 1 session with clinician, Psychoeducation, Recreational therapy.   Physician Treatment Plan for Primary Diagnosis: <principal problem not specified> Long Term Goal(s): Improvement in symptoms so as ready for discharge Improvement in symptoms so as ready for discharge   Short Term Goals: Ability to verbalize feelings will improve Ability to disclose and discuss suicidal ideas Compliance with prescribed medications will improve Ability to identify triggers associated with substance abuse/mental health issues will improve  Medication Management: Evaluate patient's response, side effects, and tolerance of medication regimen.  Therapeutic Interventions: 1 to 1 sessions, Unit Group sessions and Medication administration.  Evaluation of Outcomes: Not Met  Physician Treatment Plan  for Secondary Diagnosis: Active Problems:   Schizo-affective schizophrenia, chronic condition with acute exacerbation ([place])  Long Term Goal(s): Improvement in symptoms so as ready for discharge Improvement in symptoms so as ready for discharge   Short Term Goals: Ability to verbalize feelings will improve Ability to disclose and  discuss suicidal ideas Compliance with prescribed medications will improve Ability to identify triggers associated with substance abuse/mental health issues will improve     Medication Management: Evaluate patient's response, side effects, and tolerance of medication regimen.  Therapeutic Interventions: 1 to 1 sessions, Unit Group sessions and Medication administration.  Evaluation of Outcomes: Not Met   RN Treatment Plan for Primary Diagnosis: <principal problem not specified> Long Term Goal(s): Knowledge of disease and therapeutic regimen to maintain health will improve  Short Term Goals: Ability to remain free from injury will improve and Compliance with prescribed medications will improve  Medication Management: RN will administer medications as ordered by provider, will assess and evaluate patient's response and provide education to patient for prescribed medication. RN will report any adverse and/or side effects to prescribing provider.  Therapeutic Interventions: 1 on 1 counseling sessions, Psychoeducation, Medication administration, Evaluate responses to treatment, Monitor vital signs and CBGs as ordered, Perform/monitor CIWA, COWS, AIMS and Fall Risk screenings as ordered, Perform wound care treatments as ordered.  Evaluation of Outcomes: Not Met   LCSW Treatment Plan for Primary Diagnosis: <principal problem not specified> Long Term Goal(s): Safe transition to appropriate next level of care at discharge, Engage patient in therapeutic group addressing interpersonal concerns.  Short Term Goals: Engage patient in aftercare planning with referrals and resources, Increase social support and Increase skills for wellness and recovery  Therapeutic Interventions: Assess for all discharge needs, 1 to 1 time with Social worker, Explore available resources and support systems, Assess for adequacy in community support network, Educate family and significant other(s) on suicide prevention,  Complete Psychosocial Assessment, Interpersonal group therapy.  Evaluation of Outcomes: Not Met  Progress in Treatment: Attending groups: No. Participating in groups: No. Taking medication as prescribed: Yes. Toleration medication: Yes. Family/Significant other contact made: No, will contact:  mother, Shadeed Colberg, [phone] Patient understands diagnosis: Yes. Discussing patient identified problems/goals with staff: Yes. Medical problems stabilized or resolved: No. Denies suicidal/homicidal ideation: No. Issues/concerns per patient self-inventory: No. Other:  New problem(s) identified: No, Describe:  None reported  New Short Term/Long Term Goal(s):  Patient Goals:  "...get back to my right medication"  Discharge Plan or Barriers: Lack of Transportation Reason for Continuation of Hospitalization: Aggression Homicidal ideation Medical Issues Medication stabilization Suicidal ideation  Estimated Length of Stay: 3-5 days  Attendees: Patient: 07/23/2018 4:54 PM  Physician:  07/23/2018 4:54 PM  Nursing:  07/23/2018 4:54 PM  RN Care Manager: 07/23/2018 4:54 PM  Social Worker:  07/23/2018 4:54 PM  Recreational Therapist:  07/23/2018 4:54 PM  Other:  07/23/2018 4:54 PM  Other:  07/23/2018 4:54 PM  Other: 07/23/2018 4:54 PM    Scribe for Treatment Team: Lawana Pai, MSW Intern [facility] 07/23/2018 4:54 PM

## 2018-07-23 NOTE — Progress Notes (Signed)
Pt denies SI, HI and AVH. Pt denies having any depression or anxiety. Pt compliant with attending scheduled groups. Pt compliant with taking meds and denies any side effects.  Medications reviewed with pt. Verbal support provided. Pt encouraged to attend groups. 15 minute checks performed for safety.  No inappropriate behaviors noted.

## 2018-07-23 NOTE — Progress Notes (Signed)
[facility] MD Progress Note  07/23/2018 10:45 AM Dean Howard  MRN:  [id number] Subjective:    Dean Howard presented with a cluster of symptoms thought to be related to an exacerbation in his underlying schizoaffective/bipolar type condition, complicated by chronic cannabis dependency and alcohol dependency daily. He no longer endorses homicidal thoughts he does not have suicidal thoughts he is still easily agitated when talking about family members but does not want to harm them.  He does not have acute withdrawal symptoms.  Believes his medications are helping. Does not have current auditory or visual hallucinations. Some poverty of content with detailed exam  Principal Problem: schizoaffective Diagnosis: Active Problems:   Schizo-affective schizophrenia, chronic condition with acute exacerbation (HCC)  Total Time spent with patient: 15 minutes  Past Medical History:  Past Medical History:  Diagnosis Date  . Coronary artery disease   . MI, old   . Tick fever     Past Surgical History:  Procedure Laterality Date  . HERNIA REPAIR    . KNEE SURGERY     Family History: History reviewed. No pertinent family history.  Social History:  Social History   Substance and Sexual Activity  Alcohol Use Yes   Comment: Daily      Social History   Substance and Sexual Activity  Drug Use Yes  . Types: Marijuana   Comment: Daily     Social History   Socioeconomic History  . Marital status: Single    Spouse name: Not on file  . Number of children: Not on file  . Years of education: Not on file  . Highest education level: Not on file  Occupational History  . Not on file  Social Needs  . Financial resource strain: Not on file  . Food insecurity:    Worry: Not on file    Inability: Not on file  . Transportation needs:    Medical: Not on file    Non-medical: Not on file  Tobacco Use  . Smoking status: Current Every Day Smoker    Types: Cigars    Last attempt to quit:  03/04/2016    Years since quitting: 2.3  . Smokeless tobacco: Never Used  Substance and Sexual Activity  . Alcohol use: Yes    Comment: Daily   . Drug use: Yes    Types: Marijuana    Comment: Daily   . Sexual activity: Yes  Lifestyle  . Physical activity:    Days per week: Not on file    Minutes per session: Not on file  . Stress: Not on file  Relationships  . Social connections:    Talks on phone: Not on file    Gets together: Not on file    Attends religious service: Not on file    Active member of club or organization: Not on file    Attends meetings of clubs or organizations: Not on file    Relationship status: Not on file  Other Topics Concern  . Not on file  Social History Narrative  . Not on file   Additional Social History:                         Sleep: Good  Appetite:  Good  Current Medications: Current Facility-Administered Medications  Medication Dose Route Frequency Provider Last Rate Last Dose  . alum & mag hydroxide-simeth (MAALOX/MYLANTA) 200-200-20 MG/5ML suspension 30 mL  30 mL Oral Q4H PRN Kerry Hough, PA-C      .  benzonatate (TESSALON) capsule 200 mg  200 mg Oral TID PRN Donell SievertSimon, Spencer E, PA-C   200 mg at 07/22/18 2101  . cariprazine (VRAYLAR) capsule 3 mg  3 mg Oral Daily Malvin JohnsFarah, Makynzee Tigges, MD   3 mg at 07/23/18 0736  . diphenhydrAMINE (BENADRYL) capsule 50 mg  50 mg Oral Q6H PRN Kerry HoughSimon, Spencer E, PA-C   50 mg at 07/21/18 2157  . lithium carbonate (ESKALITH) CR tablet 450 mg  450 mg Oral Q12H Malvin JohnsFarah, Ellyanna Holton, MD   450 mg at 07/23/18 0736  . magnesium hydroxide (MILK OF MAGNESIA) suspension 30 mL  30 mL Oral Daily PRN Kerry HoughSimon, Spencer E, PA-C      . menthol-cetylpyridinium (CEPACOL) lozenge 3 mg  1 lozenge Oral PRN Donell SievertSimon, Spencer E, PA-C   3 mg at 07/21/18 2157  . temazepam (RESTORIL) capsule 30 mg  30 mg Oral QHS Malvin JohnsFarah, Charlesa Ehle, MD   30 mg at 07/22/18 2101  . traZODone (DESYREL) tablet 50 mg  50 mg Oral QHS,MR X 1 Kerry HoughSimon, Spencer E, PA-C   50 mg at  07/22/18 2101    Lab Results: No results found for this or any previous visit (from the past 48 hour(s)).  Blood Alcohol level:  Lab Results  Component Value Date   ETH <10 07/20/2018   ETH <5 08/27/2016    Metabolic Disorder Labs: Lab Results  Component Value Date   HGBA1C 5.4 07/22/2016   MPG 108 07/22/2016   Lab Results  Component Value Date   PROLACTIN 27.1 (H) 07/22/2016   Lab Results  Component Value Date   CHOL 146 07/22/2016   TRIG 95 07/22/2016   HDL 46 07/22/2016   CHOLHDL 3.2 07/22/2016   VLDL 19 07/22/2016   LDLCALC 81 07/22/2016    Physical Findings: AIMS: Facial and Oral Movements Muscles of Facial Expression: None, normal Lips and Perioral Area: None, normal Jaw: None, normal Tongue: None, normal,Extremity Movements Upper (arms, wrists, hands, fingers): None, normal Lower (legs, knees, ankles, toes): None, normal, Trunk Movements Neck, shoulders, hips: None, normal, Overall Severity Severity of abnormal movements (highest score from questions above): None, normal Incapacitation due to abnormal movements: None, normal Patient's awareness of abnormal movements (rate only patient's report): No Awareness, Dental Status Current problems with teeth and/or dentures?: No Does patient usually wear dentures?: No  CIWA:    COWS:     Musculoskeletal: Strength & Muscle Tone: within normal limits Gait & Station: normal Patient leans: N/A  Psychiatric Specialty Exam: Physical Exam  ROS  Blood pressure 115/86, pulse 89, temperature 98.1 F (36.7 C), temperature source Oral, resp. rate 16, height 6' 0.5" (1.842 m), weight 72.1 kg.Body mass index is 21.27 kg/m.  General Appearance: Casual  Eye Contact:  Fair  Speech:  Garbled  Volume:  Decreased  Mood:  Anxious  Affect:  Congruent  Thought Process:  Irrelevant  Orientation:  Full (Time, Place, and Person)  Thought Content:  Delusions  Suicidal Thoughts:  No  Homicidal Thoughts:  No  Memory:   Immediate;   Fair  Judgement:  Fair  Insight:  Shallow  Psychomotor Activity:  Normal  Concentration:  Concentration: Fair  Recall:  FiservFair  Fund of Knowledge:  Fair  Language:  Good  Akathisia:  Negative  Handed:  Right  AIMS (if indicated):     Assets:  Physical Health Resilience Social Support  ADL's:  Intact  Cognition:  WNL  Sleep:  Number of Hours: 6.75     Treatment Plan Summary: Daily contact  with patient to assess and evaluate symptoms and progress in treatment, Medication management and Plan Continue current precautions and cognitive-based therapy, continue antipsychotic/mood stabilizer therapy probable discharge Monday or Tuesday  [name], MD 07/23/2018, 10:45 AM

## 2018-07-23 NOTE — Progress Notes (Signed)
Recreation Therapy Notes  Date: 1.31.20 Time: 1000 Location: 500 Hall Dayroom  Group Topic: Self-Esteem  Goal Area(s) Addresses:  Patient will successfully identify positive attributes about themselves.  Patient will successfully identify benefit of improved self-esteem.   Behavioral Response: Engaged  Intervention: Scientist, clinical (histocompatibility and immunogenetics), magazines, scissors, glue sticks, music, colored pencils  Activity: Collage.  Patients were to use the supplies provided to create a collage that highlighted positive qualities about themselves as well as things that are important to them.  Education:  Self-Esteem, Building control surveyor.   Education Outcome: Acknowledges education/In group clarification offered/Needs additional education  Clinical Observations/Feedback:  Pt was bright, singing the songs, socializing with peers and reminiscing.  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   Caroll Rancher, LRT/CTRS         Caroll Rancher A 07/23/2018 11:35 AM

## 2018-07-24 DIAGNOSIS — F250 Schizoaffective disorder, bipolar type: Principal | ICD-10-CM

## 2018-07-24 LAB — INFLUENZA PANEL BY PCR (TYPE A & B)
Influenza A By PCR: NEGATIVE
Influenza B By PCR: NEGATIVE

## 2018-07-24 NOTE — [facility] Group Notes (Signed)
  [facility] LCSW Group Therapy Note  Date/Time:  07/24/2018 11:15AM-12:00PM  Type of Therapy and Topic:  Group Therapy:  Feelings About Hospitalization  Participation Level:  Active   Description of Group This process group involved patients discussing their feelings related to being hospitalized, as well as the benefits they see to being in the hospital.  These feelings and benefits were itemized.  The group then brainstormed specific ways in which they could seek those same benefits when they discharge and return home.  Therapeutic Goals 1. Patient will identify and describe positive and negative feelings related to hospitalization 2. Patient will verbalize benefits of hospitalization to themselves personally 3. Patients will brainstorm together ways they can obtain similar benefits in the outpatient setting, identify barriers to wellness and possible solutions  Summary of Patient Progress:  The patient expressed his primary feelings about being hospitalized are "I hate it.  They don't like to let you leave and it's expensive for things like x-rays."  He said he once was put in a cast on his foot and told to stay on his crutches for 6-8 weeks; however, when he received a hospital bill 4 days later, this angered him so he cut the cast off and went back to work walking regularly.  He was quite vocal throughout group, not always reality-based however.  Therapeutic Modalities Cognitive Behavioral Therapy Motivational Interviewing    Ambrose Mantle, LCSW 07/24/2018, 2:15 PM

## 2018-07-24 NOTE — Progress Notes (Signed)
[facility]  07/24/2018 10:46 AM Dean Howard  MRN:  [id number] Subjective: Patient is seen and examined.  Patient is a 53 year old male with a past psychiatric history of schizoaffective disorder/bipolar type, complicated by chronic cannabis dependency as well as alcohol dependency.  He was admitted on 07/22/2018 who presented with migraine headaches as well as suicidal ideation.  He apparently was attempting to overdose on 25-8 mg aspirin tablets on 1/27.  Objective: Patient is seen and examined.  Patient is a 53 year old male with the above-stated past psychiatric history was seen in follow-up.  He stated he was doing better since the medications were placed on admission.  He denied any suicidal or homicidal ideation.  He still is significantly psychomotor agitated.  He does not have any pressured speech, and denied any auditory or visual hallucinations.  He does discussed the fact that he is "thinking of lay away", and that he likes to purchase outfits and that helps him feel better.  His current medications include Vraylar, lithium carbonate and temazepam.  His blood pressure stable this morning but he is slightly tachycardic with a rate of 109.  Nursing notes only reflect that he slept 3 hours last night.  Principal Problem: <principal problem not specified> Diagnosis: Active Problems:   Schizo-affective schizophrenia, chronic condition with acute exacerbation (HCC)  Total Time spent with patient: 30 minutes  Past Psychiatric History: See admission H&P  Past Medical History:  Past Medical History:  Diagnosis Date  . Coronary artery disease   . MI, old   . Tick fever     Past Surgical History:  Procedure Laterality Date  . HERNIA REPAIR    . KNEE SURGERY     Family History: History reviewed. No pertinent family history. Family Psychiatric  History: See admission H&P Social History:  Social History   Substance and Sexual Activity  Alcohol Use Yes   Comment: Daily       Social History   Substance and Sexual Activity  Drug Use Yes  . Types: Marijuana   Comment: Daily     Social History   Socioeconomic History  . Marital status: Single    Spouse name: Not on file  . Number of children: Not on file  . Years of education: Not on file  . Highest education level: Not on file  Occupational History  . Not on file  Social Needs  . Financial resource strain: Not on file  . Food insecurity:    Worry: Not on file    Inability: Not on file  . Transportation needs:    Medical: Not on file    Non-medical: Not on file  Tobacco Use  . Smoking status: Current Every Day Smoker    Types: Cigars    Last attempt to quit: 03/04/2016    Years since quitting: 2.3  . Smokeless tobacco: Never Used  Substance and Sexual Activity  . Alcohol use: Yes    Comment: Daily   . Drug use: Yes    Types: Marijuana    Comment: Daily   . Sexual activity: Yes  Lifestyle  . Physical activity:    Days per week: Not on file    Minutes per session: Not on file  . Stress: Not on file  Relationships  . Social connections:    Talks on phone: Not on file    Gets together: Not on file    Attends religious service: Not on file    Active member of club or  organization: Not on file    Attends meetings of clubs or organizations: Not on file    Relationship status: Not on file  Other Topics Concern  . Not on file  Social History Narrative  . Not on file   Additional Social History:                         Sleep: Poor  Appetite:  Fair  Current Medications: Current Facility-Administered Medications  Medication Dose Route Frequency Provider Last Rate Last Dose  . alum & mag hydroxide-simeth (MAALOX/MYLANTA) 200-200-20 MG/5ML suspension 30 mL  30 mL Oral Q4H PRN Kerry Hough, PA-C      . benzonatate (TESSALON) capsule 200 mg  200 mg Oral TID PRN Donell Sievert E, PA-C   200 mg at 07/24/18 0031  . cariprazine (VRAYLAR) capsule 3 mg  3 mg Oral Daily [name], MD   3 mg at 07/24/18 1607  . diphenhydrAMINE (BENADRYL) capsule 50 mg  50 mg Oral Q6H PRN Kerry Hough, PA-C   50 mg at 07/21/18 2157  . lithium carbonate (ESKALITH) CR tablet 450 mg  450 mg Oral Q12H [name], MD   450 mg at 07/24/18 0803  . magnesium hydroxide (MILK OF MAGNESIA) suspension 30 mL  30 mL Oral Daily PRN Kerry Hough, PA-C      . menthol-cetylpyridinium (CEPACOL) lozenge 3 mg  1 lozenge Oral PRN Donell Sievert E, PA-C   3 mg at 07/21/18 2157  . temazepam (RESTORIL) capsule 30 mg  30 mg Oral QHS [name], MD   30 mg at 07/23/18 2109  . traZODone (DESYREL) tablet 100 mg  100 mg Oral QHS PRN Nira Conn A, NP   100 mg at 07/23/18 2109    Lab Results: No results found for this or any previous visit (from the past 48 hour(s)).  Blood Alcohol level:  Lab Results  Component Value Date   ETH <10 07/20/2018   ETH <5 08/27/2016    Metabolic Disorder Labs: Lab Results  Component Value Date   HGBA1C 5.4 07/22/2016   MPG 108 07/22/2016   Lab Results  Component Value Date   PROLACTIN 27.1 (H) 07/22/2016   Lab Results  Component Value Date   CHOL 146 07/22/2016   TRIG 95 07/22/2016   HDL 46 07/22/2016   CHOLHDL 3.2 07/22/2016   VLDL 19 07/22/2016   LDLCALC 81 07/22/2016    Physical Findings: AIMS: Facial and Oral Movements Muscles of Facial Expression: None, normal Lips and Perioral Area: None, normal Jaw: None, normal Tongue: None, normal,Extremity Movements Upper (arms, wrists, hands, fingers): None, normal Lower (legs, knees, ankles, toes): None, normal, Trunk Movements Neck, shoulders, hips: None, normal, Overall Severity Severity of abnormal movements (highest score from questions above): None, normal Incapacitation due to abnormal movements: None, normal Patient's awareness of abnormal movements (rate only patient's report): No Awareness, Dental Status Current problems with teeth and/or dentures?: No Does patient usually wear  dentures?: No  CIWA:    COWS:     Musculoskeletal: Strength & Muscle Tone: within normal limits Gait & Station: normal Patient leans: N/A  Psychiatric Specialty Exam: Physical Exam  Nursing note and vitals reviewed. Constitutional: He is oriented to person, place, and time. He appears well-developed and well-nourished.  HENT:  Head: Normocephalic and atraumatic.  Respiratory: Effort normal.  Neurological: He is alert and oriented to person, place, and time.    ROS  Blood pressure 104/82,  pulse (!) 109, temperature 98.3 F (36.8 C), temperature source Oral, resp. rate 16, height 6' 0.5" (1.842 m), weight 72.1 kg.Body mass index is 21.27 kg/m.  General Appearance: Casual  Eye Contact:  Fair  Speech:  Normal Rate  Volume:  Normal  Mood:  Anxious  Affect:  Congruent  Thought Process:  Coherent and Descriptions of Associations: Tangential  Orientation:  Full (Time, Place, and Person)  Thought Content:  Tangential  Suicidal Thoughts:  No  Homicidal Thoughts:  No  Memory:  Immediate;   Fair Recent;   Fair Remote;   Fair  Judgement:  Intact  Insight:  Lacking  Psychomotor Activity:  Increased  Concentration:  Concentration: Fair and Attention Span: Fair  Recall:  [employer]  Fund of Knowledge:  Fair  Language:  Fair  Akathisia:  Negative  Handed:  Right  AIMS (if indicated):     Assets:  Communication Skills Desire for Improvement Housing Resilience Social Support  ADL's:  Intact  Cognition:  WNL  Sleep:  Number of Hours: 3     Treatment Plan Summary: Daily contact with patient to assess and evaluate symptoms and progress in treatment, Medication management and Plan Patient is seen and examined.  Patient is a 53 year old male with the above-stated past psychiatric history who is seen in follow-up.  Patient appears to still have some degree of mania.  He psychomotor agitated, he is not sleeping well, and a bit tangential.  He is currently on Vraylar 3 mg p.o. daily and  lithium carbonate 450 mg every 12 hours.  Nursing notes reflect that he was unable to sleep last night due to coughing last night.  He was started on [employer] Perles.  I am not going to change any of his medications today.  Hopefully he will sleep better with his cough being in better control.  I am going to order lithium level, TSH and chemistries on 2/3 given his elevated creatinine on admission.  He is not showing any signs or symptoms of lithium toxicity at least at this point.  His EKG on admission was a normal sinus rhythm, and we will monitor his heart rate.  Given the cough and influenza patient on the unit I am going to get him tested for influenza today. 1.  Continue Tessalon Perles 200 mg p.o. 3 times daily as needed cough. 2.  Continue Vraylar 3 mg p.o. daily for psychosis and mood stability. 3.  Continue lithium carbonate CR 450 mg p.o. every 12 hours for mood stability. 4.  Continue temazepam 30 mg p.o. nightly for sleep. 5.  Continue trazodone 100 mg p.o. nightly as needed insomnia. 6.  Order lithium level, chemistries and a TSH for 07/26/2018 7.  Disposition planning-in progress  Antonieta PertGreg Lawson Clary, MD 07/24/2018, 10:46 AM

## 2018-07-24 NOTE — Plan of Care (Signed)
  Problem: Education: Goal: Emotional status will improve Outcome: Progressing Goal: Mental status will improve Outcome: Progressing Goal: Verbalization of understanding the information provided will improve Outcome: Progressing   Problem: Coping: Goal: Ability to demonstrate self-control will improve Outcome: Adequate for Discharge   Problem: Health Behavior/Discharge Planning: Goal: Identification of resources available to assist in meeting health care needs will improve Outcome: Adequate for Discharge  D: Patient is pleasant with staff.  He requested a 72 hour discharge form and it was provided for him.  He wrote staff a note, "I, Dean Howard, am requesting to be released Monday, 07/26/18.  I'm no longer considering or thinking of harming myself or others.  Therefore, I feel that I'm able to go home and get myself back on track."  Patient denies any thoughts of self harm.  He denies AVH.  A: Continue to monitor medication management and MD orders.  Safety checks completed every 15 minutes per protocol.  Offer support and encouragement as needed.  R: Patient is receptive to staff; his/her behavior is appropriate.

## 2018-07-25 MED ORDER — SULFAMETHOXAZOLE-TRIMETHOPRIM 800-160 MG PO TABS: 1.0000 | ORAL_TABLET | Freq: Two times a day (BID) | ORAL | Status: DC

## 2018-07-25 MED ORDER — CARIPRAZINE HCL 1.5 MG PO CAPS
1.5000 mg | ORAL_CAPSULE | Freq: Every day | ORAL | Status: AC
  Administered 2018-07-25: 1.5 mg via ORAL
  Filled 2018-07-25: qty 1

## 2018-07-25 MED ORDER — AZITHROMYCIN 500 MG PO TABS
500.0000 mg | ORAL_TABLET | Freq: Every day | ORAL | Status: AC
  Administered 2018-07-25: 500 mg via ORAL
  Filled 2018-07-25: qty 1

## 2018-07-25 MED ORDER — CARIPRAZINE HCL 1.5 MG PO CAPS
4.5000 mg | ORAL_CAPSULE | Freq: Every day | ORAL | Status: DC
  Administered 2018-07-26: 4.5 mg via ORAL
  Filled 2018-07-25: qty 3
  Filled 2018-07-25: qty 21
  Filled 2018-07-25: qty 3

## 2018-07-25 MED ORDER — AZITHROMYCIN 250 MG PO TABS
250.0000 mg | ORAL_TABLET | Freq: Every day | ORAL | Status: DC
  Administered 2018-07-26: 250 mg via ORAL
  Filled 2018-07-25 (×2): qty 1
  Filled 2018-07-25: qty 3

## 2018-07-25 MED ORDER — CARIPRAZINE HCL 3 MG PO CAPS: 4.5000 mg | ORAL_CAPSULE | Freq: Every day | ORAL | Status: DC

## 2018-07-25 NOTE — Progress Notes (Signed)
Writer spoke with patient at medication window. He reports he has had a good day.  Writer informed him of medications ordered for him.He has been observed up in the dayroom watching tv with minimal interaction with peers. He attended group. Safety maintained on unit with 15 min checks.

## 2018-07-25 NOTE — Progress Notes (Signed)
DAR NOTE: Pt present with bright affect and calm mood in the unit. Pt has been observed in the dayroom interacting with peers well. Pt complained of a cough related to him having a history of bronchitis, was examined by the doctor. Pt denies physical pain, took all his meds as scheduled. As per self inventory, pt had a good night sleep, good appetite, normal energy, and good concentration. Pt rate depression at 0, hopeless ness at 0, and anxiety at 0. Pt's safety ensured with 15 minute and environmental checks. Pt currently denies SI/HI and A/V hallucinations. Pt verbally agrees to seek staff if SI/HI or A/VH occurs and to consult with staff before acting on these thoughts. Will continue POC.

## 2018-07-25 NOTE — Progress Notes (Signed)
[facility] MD Progress Note  07/25/2018 10:53 AM Dean Howard  MRN:  [id number] Subjective: Patient is seen and examined.  Patient is a 53 year old male with a past psychiatric history significant for schizoaffective disorder/bipolar type.  He was admitted on 07/22/2018 with migraine headaches as well as suicidal ideation.  Objective: Patient is seen and examined.  Patient's 53 year old male with the above-stated past psychiatric history who is seen in follow-up.  He is doing well today.  He denied any auditory or visual hallucinations.  He denied any suicidal or homicidal ideation.  He still has significant psychomotor agitation but not as bad as it was.  His speech is still slightly pressured.  He denied any side effects to the Vraylar, lithium or temazepam.  His vital signs are stable, he is afebrile.  His cough is continued, and he stated he did have a history of chronic bronchitis.  Physical examination revealed his lungs to be essentially clear.  The only issue currently is that he still not sleeping well.  Nursing notes reflect he only had 4.75 hours last night.  Principal Problem: <principal problem not specified> Diagnosis: Active Problems:   Schizo-affective schizophrenia, chronic condition with acute exacerbation (HCC)  Total Time spent with patient: 15 minutes  Past Psychiatric History: See admission H&P  Past Medical History:  Past Medical History:  Diagnosis Date  . Coronary artery disease   . MI, old   . Tick fever     Past Surgical History:  Procedure Laterality Date  . HERNIA REPAIR    . KNEE SURGERY     Family History: History reviewed. No pertinent family history. Family Psychiatric  History: See admission H&P Social History:  Social History   Substance and Sexual Activity  Alcohol Use Yes   Comment: Daily      Social History   Substance and Sexual Activity  Drug Use Yes  . Types: Marijuana   Comment: Daily     Social History   Socioeconomic History  .  Marital status: Single    Spouse name: Not on file  . Number of children: Not on file  . Years of education: Not on file  . Highest education level: Not on file  Occupational History  . Not on file  Social Needs  . Financial resource strain: Not on file  . Food insecurity:    Worry: Not on file    Inability: Not on file  . Transportation needs:    Medical: Not on file    Non-medical: Not on file  Tobacco Use  . Smoking status: Current Every Day Smoker    Types: Cigars    Last attempt to quit: 03/04/2016    Years since quitting: 2.3  . Smokeless tobacco: Never Used  Substance and Sexual Activity  . Alcohol use: Yes    Comment: Daily   . Drug use: Yes    Types: Marijuana    Comment: Daily   . Sexual activity: Yes  Lifestyle  . Physical activity:    Days per week: Not on file    Minutes per session: Not on file  . Stress: Not on file  Relationships  . Social connections:    Talks on phone: Not on file    Gets together: Not on file    Attends religious service: Not on file    Active member of club or organization: Not on file    Attends meetings of clubs or organizations: Not on file    Relationship status: Not  on file  Other Topics Concern  . Not on file  Social History Narrative  . Not on file   Additional Social History:                         Sleep: Fair  Appetite:  Good  Current Medications: Current Facility-Administered Medications  Medication Dose Route Frequency Provider Last Rate Last Dose  . alum & mag hydroxide-simeth (MAALOX/MYLANTA) 200-200-20 MG/5ML suspension 30 mL  30 mL Oral Q4H PRN Kerry Hough, PA-C      . benzonatate (TESSALON) capsule 200 mg  200 mg Oral TID PRN Donell Sievert E, PA-C   200 mg at 07/24/18 0031  . cariprazine (VRAYLAR) capsule 3 mg  3 mg Oral Daily [name], MD   3 mg at 07/25/18 0750  . diphenhydrAMINE (BENADRYL) capsule 50 mg  50 mg Oral Q6H PRN Kerry Hough, PA-C   50 mg at 07/21/18 2157  . lithium  carbonate (ESKALITH) CR tablet 450 mg  450 mg Oral Q12H [name], MD   450 mg at 07/25/18 0750  . magnesium hydroxide (MILK OF MAGNESIA) suspension 30 mL  30 mL Oral Daily PRN Kerry Hough, PA-C      . menthol-cetylpyridinium (CEPACOL) lozenge 3 mg  1 lozenge Oral PRN Donell Sievert E, PA-C   3 mg at 07/21/18 2157  . sulfamethoxazole-trimethoprim (BACTRIM DS,SEPTRA DS) 800-160 MG per tablet 1 tablet  1 tablet Oral Q12H Antonieta Pert, MD      . temazepam (RESTORIL) capsule 30 mg  30 mg Oral QHS [name], MD   30 mg at 07/24/18 2102  . traZODone (DESYREL) tablet 100 mg  100 mg Oral QHS PRN Jackelyn Poling, NP   100 mg at 07/24/18 2103    Lab Results:  Results for orders placed or performed during the hospital encounter of 07/21/18 (from the past 48 hour(s))  Influenza panel by PCR (type A & B)     Status: None   Collection Time: 07/24/18 11:00 AM  Result Value Ref Range   Influenza A By PCR NEGATIVE NEGATIVE   Influenza B By PCR NEGATIVE NEGATIVE    Comment: (NOTE) The Xpert Xpress Flu assay is intended as an aid in the diagnosis of  influenza and should not be used as a sole basis for treatment.  This  assay is FDA approved for nasopharyngeal swab specimens only. Nasal  washings and aspirates are unacceptable for Xpert Xpress Flu testing. Performed at [facility], [address] [place], [place] [address]     Blood Alcohol level:  Lab Results  Component Value Date   [facility] <10 07/20/2018   ETH <5 08/27/2016    Metabolic Disorder Labs: Lab Results  Component Value Date   HGBA1C 5.4 07/22/2016   MPG 108 07/22/2016   Lab Results  Component Value Date   PROLACTIN 27.1 (H) 07/22/2016   Lab Results  Component Value Date   CHOL 146 07/22/2016   TRIG 95 07/22/2016   HDL 46 07/22/2016   CHOLHDL 3.2 07/22/2016   VLDL 19 07/22/2016   LDLCALC 81 07/22/2016    Physical Findings: AIMS: Facial and Oral Movements Muscles of Facial Expression:  None, normal Lips and Perioral Area: None, normal Jaw: None, normal Tongue: None, normal,Extremity Movements Upper (arms, wrists, hands, fingers): None, normal Lower (legs, knees, ankles, toes): None, normal, Trunk Movements Neck, shoulders, hips: None, normal, Overall Severity Severity of abnormal movements (  highest score from questions above): None, normal Incapacitation due to abnormal movements: None, normal Patient's awareness of abnormal movements (rate only patient's report): No Awareness, Dental Status Current problems with teeth and/or dentures?: No Does patient usually wear dentures?: No  CIWA:    COWS:     Musculoskeletal: Strength & Muscle Tone: within normal limits Gait & Station: normal Patient leans: N/A  Psychiatric Specialty Exam: Physical Exam  ROS  Blood pressure 109/84, pulse 96, temperature 98 F (36.7 C), temperature source Oral, resp. rate 16, height 6' 0.5" (1.842 m), weight 72.1 kg.Body mass index is 21.27 kg/m.  General Appearance: Casual  Eye Contact:  Good  Speech:  Pressured  Volume:  Increased  Mood:  Euphoric  Affect:  Congruent  Thought Process:  Coherent and Descriptions of Associations: Intact  Orientation:  Full (Time, Place, and Person)  Thought Content:  Logical  Suicidal Thoughts:  No  Homicidal Thoughts:  No  Memory:  Immediate;   Fair Recent;   Fair Remote;   Fair  Judgement:  Intact  Insight:  Fair  Psychomotor Activity:  Increased  Concentration:  Concentration: Fair and Attention Span: Fair  Recall:  [employer]  Fund of Knowledge:  Fair  Language:  Fair  Akathisia:  Negative  Handed:  Right  AIMS (if indicated):     Assets:  Communication Skills Desire for Improvement Housing Leisure Time Physical Health Resilience  ADL's:  Intact  Cognition:  WNL  Sleep:  Number of Hours: 4.75     Treatment Plan Summary: Daily contact with patient to assess and evaluate symptoms and progress in treatment, Medication management and  Plan : Patient is a 53 year old male with the above-stated past psychiatric history who is seen in follow-up.  [name] is improved, but he still a bit pressured, still not sleeping well, and still a bit tangential.  I am going to increase his Vraylar to 4.5 mg p.o. daily.  His lithium level and other laboratories are due tomorrow morning.  We will see with that looks like prior to increasing his dosage.  He continues to have a cough, and he stated he had a history of chronic bronchitis.  I am going to start him on azithromycin in case the cough and bronchitis are causing issues with regard to sleep.  His influenza test was negative.  No other changes to his medications. 1.  Start azithromycin 500 mg p.o. today, 250 mg p.o. daily for 4 days for cough and bronchitis. 2.  Increase Vraylar to 4.5 mg p.o. daily for psychosis and mood stability. 3.  Continue Tessalon Perles 200 mg p.o. 3 times daily as needed cough. 4.  Continue lithium carbonate CR 450 mg p.o. every 12 hours for mood stability. 5.  Continue temazepam 30 mg p.o. nightly for sleep. 6.  Increase trazodone 250 mg p.o. nightly as needed insomnia. 7.  Lithium level, chemistries and TSH in the a.m. on 07/26/2018. 8.  Disposition planning-in progress.  Antonieta PertGreg Lawson Clary, MD 07/25/2018, 10:53 AM

## 2018-07-25 NOTE — BHH Group Notes (Signed)
[facility] LCSW Group Therapy Note  Date/Time:  07/25/2018  11:00AM-12:00PM  Type of Therapy and Topic:  Group Therapy:  Music and Mood  Participation Level:  Active   Description of Group: In this process group, members listened to a variety of genres of music and identified that different types of music evoke different responses.  Patients were encouraged to identify music that was soothing for them and music that was energizing for them.  Patients discussed how this knowledge can help with wellness and recovery in various ways including managing depression and anxiety as well as encouraging healthy sleep habits.    Therapeutic Goals: 1. Patients will explore the impact of different varieties of music on mood 2. Patients will verbalize the thoughts they have when listening to different types of music 3. Patients will identify music that is soothing to them as well as music that is energizing to them 4. Patients will discuss how to use this knowledge to assist in maintaining wellness and recovery 5. Patients will explore the use of music as a coping skill  Summary of Patient Progress:  At the beginning of group, patient expressed that he enjoys music a great deal.  He danced, sang, and played "air" instruments throughout the hour and said he felt very good as a result.  Therapeutic Modalities: Solution Focused Brief Therapy Activity   Ambrose Mantle, LCSW

## 2018-07-26 LAB — BASIC METABOLIC PANEL
Anion gap: 8 (ref 5–15)
BUN: 15 mg/dL (ref 6–20)
CO2: 25 mmol/L (ref 22–32)
Calcium: 9.5 mg/dL (ref 8.9–10.3)
Chloride: 106 mmol/L (ref 98–111)
Creatinine, Ser: 1.25 mg/dL — ABNORMAL HIGH (ref 0.61–1.24)
GFR calc Af Amer: >60 mL/min (ref >60)
GFR calc non Af Amer: >60 mL/min (ref >60)
Glucose, Bld: 91 mg/dL (ref 70–99)
Potassium: 3.9 mmol/L (ref 3.5–5.1)
Sodium: 139 mmol/L (ref 135–145)

## 2018-07-26 MED ORDER — TEMAZEPAM 30 MG PO CAPS: 30.0000 mg | ORAL_CAPSULE | Freq: Every day | ORAL | 0 refills | Status: DC

## 2018-07-26 MED ORDER — LITHIUM CARBONATE ER 450 MG PO TBCR: 450.0000 mg | EXTENDED_RELEASE_TABLET | Freq: Two times a day (BID) | ORAL | 0 refills | Status: DC

## 2018-07-26 MED ORDER — CARIPRAZINE HCL 4.5 MG PO CAPS: 4.5000 mg | ORAL_CAPSULE | Freq: Every day | ORAL | 0 refills | Status: DC

## 2018-07-26 MED ORDER — AZITHROMYCIN 250 MG PO TABS: ORAL_TABLET | ORAL | 0 refills | Status: DC

## 2018-07-26 MED ORDER — TRAZODONE HCL 100 MG PO TABS: 100.0000 mg | ORAL_TABLET | Freq: Every evening | ORAL | 0 refills | Status: DC | PRN

## 2018-07-26 NOTE — BHH Suicide Risk Assessment (Signed)
[facility] Discharge Suicide Risk Assessment   Principal Problem: Exacerbation and underlying psychotic disorder Discharge Diagnoses: Active Problems:   Schizo-affective schizophrenia, chronic condition with acute exacerbation (HCC)  Remains generally stable in mood and affect without acute psychosis without thoughts of harming self or others fully compliant  Total Time spent with patient: 45 minutes Mental Status Per Nursing Assessment::   On Admission:  Suicidal ideation indicated by patient, Self-harm thoughts  Demographic Factors:  male  Loss Factors: Decrease in vocational status  Historical Factors: NA  Risk Reduction Factors:   Religious beliefs about death  Continued Clinical Symptoms:  Schizophrenia:   Paranoid or undifferentiated type  Cognitive Features That Contribute To Risk:  Loss of executive function    Suicide Risk:  Minimal: No identifiable suicidal ideation.  Patients presenting with no risk factors but with morbid ruminations; may be classified as minimal risk based on the severity of the depressive symptoms  Follow-up Information    [facility] Follow up on 07/29/2018.   Why:  Hospital Follow up appointment is 2/6 at 8:00am. Please bring identification, social security card, insurance card and discharge summary from this hospitalization. Contact information: [address] [place] [address] [phone]           Plan Of Care/Follow-up recommendations:  Activity:  full  Ajeet Casasola, MD 07/26/2018, 9:50 AM

## 2018-07-26 NOTE — Plan of Care (Signed)
Pt was able to identify coping skills at completion of recreation therapy group sessions.   Dean Howard, LRT/CTRS 

## 2018-07-26 NOTE — [facility] Group Notes (Signed)
[facility] LCSW Group Therapy Note  Date/Time: 07/26/18, 1300  Type of Therapy and Topic:  Group Therapy:  Overcoming Obstacles  Participation Level:  active  Description of Group:    In this group patients will be encouraged to explore what they see as obstacles to their own wellness and recovery. They will be guided to discuss their thoughts, feelings, and behaviors related to these obstacles. The group will process together ways to cope with barriers, with attention given to specific choices patients can make. Each patient will be challenged to identify changes they are motivated to make in order to overcome their obstacles. This group will be process-oriented, with patients participating in exploration of their own experiences as well as giving and receiving support and challenge from other group members.  Therapeutic Goals: 1. Patient will identify personal and current obstacles as they relate to admission. 2. Patient will identify barriers that currently interfere with their wellness or overcoming obstacles.  3. Patient will identify feelings, thought process and behaviors related to these barriers. 4. Patient will identify two changes they are willing to make to overcome these obstacles:    Summary of Patient Progress: pt  Shared that conflict with others and mental illness are current obstacles that he faces.  Pt took some responsibility for not taking his medications as a contributing factor in his current admission.       Therapeutic Modalities:   Cognitive Behavioral Therapy Solution Focused Therapy Motivational Interviewing Relapse Prevention Therapy  Daleen Squibb, LCSW

## 2018-07-26 NOTE — Progress Notes (Signed)
  [facility] Case Management Discharge Plan :  Will you be returning to the same living situation after discharge:  Yes,  with his mother At discharge, do you have transportation home?: Yes,  mother Do you have the ability to pay for your medications: No. Pt will work with [employer].  Release of information consent forms completed and in the chart;  Patient's signature needed at discharge.  Patient to Follow up at: Follow-up Information    [facility] Follow up on 07/29/2018.   Why:  Hospital Follow up appointment is 2/6 at 8:00am. Please bring identification, social security card, insurance card and discharge summary from this hospitalization. Contact information: [address] [place] [address] [phone]           Next level of care provider has access to [facility]:no  Safety Planning and Suicide Prevention discussed: Yes,  with mother  Have you used any form of tobacco in the last 30 days? (Cigarettes, Smokeless Tobacco, Cigars, and/or Pipes): No  Has patient been referred to the Quitline?: N/A patient is not a smoker  Patient has been referred for addiction treatment: Pt. refused referral  Lorri Frederick, LCSW 07/26/2018, 1:03 PM

## 2018-07-26 NOTE — [facility] Suicide Risk Assessment (Signed)
[facility] INPATIENT:  Family/Significant Other Suicide Prevention Education  Suicide Prevention Education:  Contact Attempts: Dean Howard, mother, [phone], has been identified by the patient as the family member/significant other with whom the patient will be residing, and identified as the person(s) who will aid the patient in the event of a mental health crisis.  With written consent from the patient, two attempts were made to provide suicide prevention education, prior to and/or following the patient's discharge.  We were unsuccessful in providing suicide prevention education.  A suicide education pamphlet was given to the patient to share with family/significant other.  Date and time of first attempt:07/26/18, 70947 Date and time of second attempt:  Dean Howard, Dean Howard Jon, LCSW 07/26/2018, 9:47 AM

## 2018-07-26 NOTE — Plan of Care (Signed)
Discharge note  Patient verbalizes readiness for discharge. Follow up plan explained, AVS, Transition record and SRA given. Prescriptions and teaching provided. Belongings returned and signed for. Suicide safety plan completed and signed. Patient verbalizes understanding. Patient denies SI/HI and assures this Clinical research associatewriter he will seek assistance should that change. Patient discharged to lobby where his mother was waiting.  Problem: Education: Goal: Knowledge of [facility] Education information/materials will improve Outcome: Adequate for Discharge Goal: Emotional status will improve Outcome: Adequate for Discharge Goal: Mental status will improve Outcome: Adequate for Discharge Goal: Verbalization of understanding the information provided will improve Outcome: Adequate for Discharge   Problem: Activity: Goal: Interest or engagement in activities will improve Outcome: Adequate for Discharge Goal: Sleeping patterns will improve Outcome: Adequate for Discharge   Problem: Coping: Goal: Ability to verbalize frustrations and anger appropriately will improve Outcome: Adequate for Discharge Goal: Ability to demonstrate self-control will improve Outcome: Adequate for Discharge   Problem: Health Behavior/Discharge Planning: Goal: Identification of resources available to assist in meeting health care needs will improve Outcome: Adequate for Discharge Goal: Compliance with treatment plan for underlying cause of condition will improve Outcome: Adequate for Discharge   Problem: Physical Regulation: Goal: Ability to maintain clinical measurements within normal limits will improve Outcome: Adequate for Discharge   Problem: Safety: Goal: Periods of time without injury will increase Outcome: Adequate for Discharge   Problem: Education: Goal: Utilization of techniques to improve thought processes will improve Outcome: Adequate for Discharge Goal: Knowledge of the prescribed therapeutic  regimen will improve Outcome: Adequate for Discharge   Problem: Activity: Goal: Interest or engagement in leisure activities will improve Outcome: Adequate for Discharge Goal: Imbalance in normal sleep/wake cycle will improve Outcome: Adequate for Discharge   Problem: Coping: Goal: Coping ability will improve Outcome: Adequate for Discharge Goal: Will verbalize feelings Outcome: Adequate for Discharge   Problem: Health Behavior/Discharge Planning: Goal: Ability to make decisions will improve Outcome: Adequate for Discharge Goal: Compliance with therapeutic regimen will improve Outcome: Adequate for Discharge   Problem: Role Relationship: Goal: Will demonstrate positive changes in social behaviors and relationships Outcome: Adequate for Discharge   Problem: Safety: Goal: Ability to disclose and discuss suicidal ideas will improve Outcome: Adequate for Discharge Goal: Ability to identify and utilize support systems that promote safety will improve Outcome: Adequate for Discharge   Problem: Self-Concept: Goal: Will verbalize positive feelings about self Outcome: Adequate for Discharge Goal: Level of anxiety will decrease Outcome: Adequate for Discharge   Problem: Education: Goal: Ability to make informed decisions regarding treatment will improve Outcome: Adequate for Discharge   Problem: Coping: Goal: Coping ability will improve Outcome: Adequate for Discharge   Problem: Health Behavior/Discharge Planning: Goal: Identification of resources available to assist in meeting health care needs will improve Outcome: Adequate for Discharge   Problem: Medication: Goal: Compliance with prescribed medication regimen will improve Outcome: Adequate for Discharge   Problem: Self-Concept: Goal: Ability to disclose and discuss suicidal ideas will improve Outcome: Adequate for Discharge Goal: Will verbalize positive feelings about self Outcome: Adequate for Discharge    Problem: Activity: Goal: Will verbalize the importance of balancing activity with adequate rest periods Outcome: Adequate for Discharge   Problem: Education: Goal: Will be free of psychotic symptoms Outcome: Adequate for Discharge Goal: Knowledge of the prescribed therapeutic regimen will improve Outcome: Adequate for Discharge   Problem: Coping: Goal: Coping ability will improve Outcome: Adequate for Discharge Goal: Will verbalize feelings Outcome: Adequate for Discharge   Problem: Health  Behavior/Discharge Planning: Goal: Compliance with prescribed medication regimen will improve Outcome: Adequate for Discharge   Problem: Nutritional: Goal: Ability to achieve adequate nutritional intake will improve Outcome: Adequate for Discharge   Problem: Role Relationship: Goal: Ability to communicate needs accurately will improve Outcome: Adequate for Discharge Goal: Ability to interact with others will improve Outcome: Adequate for Discharge   Problem: Safety: Goal: Ability to redirect hostility and anger into socially appropriate behaviors will improve Outcome: Adequate for Discharge Goal: Ability to remain free from injury will improve Outcome: Adequate for Discharge   Problem: Self-Care: Goal: Ability to participate in self-care as condition permits will improve Outcome: Adequate for Discharge   Problem: Self-Concept: Goal: Will verbalize positive feelings about self Outcome: Adequate for Discharge   Problem: Education: Goal: Knowledge of General Education information will improve Description Including pain rating scale, medication(s)/side effects and non-pharmacologic comfort measures Outcome: Adequate for Discharge   Problem: Health Behavior/Discharge Planning: Goal: Ability to manage health-related needs will improve Outcome: Adequate for Discharge   Problem: Coping: Goal: Level of anxiety will decrease Outcome: Adequate for Discharge   Problem: Safety: Goal:  Ability to remain free from injury will improve Outcome: Adequate for Discharge

## 2018-07-26 NOTE — Progress Notes (Signed)
Recreation Therapy Notes  INPATIENT RECREATION TR PLAN  Patient Details Name: Dean Howard MRN: [id number] DOB: 02-03-1966 Today's Date: 07/26/2018  Rec Therapy Plan Is patient appropriate for Therapeutic Recreation?: Yes Treatment times per week: about 3 days Estimated Length of Stay: 5-7 days TR Treatment/Interventions: Group participation (Comment)  Discharge Criteria Pt will be discharged from therapy if:: Discharged Treatment plan/goals/alternatives discussed and agreed upon by:: Patient/family  Discharge Summary Short term goals set: See patient care plan Short term goals met: Complete Progress toward goals comments: Groups attended Which groups?: Self-esteem, Other (Comment)(Anxiety) Reason goals not met: None Therapeutic equipment acquired: N/A Reason patient discharged from therapy: Discharge from hospital Pt/family agrees with progress & goals achieved: Yes Date patient discharged from therapy: 07/26/18    Victorino Sparrow, LRT/CTRS  Ria Comment, [place] 07/26/2018, 11:44 AM

## 2018-07-26 NOTE — Progress Notes (Signed)
Recreation Therapy Notes    Date: 2.3.20 Time: 1000 Location: 500 [address] Dayroom  Group Topic: Anxiety  Goal Area(s) Addresses:  Patient will identify triggers for anxiety.  Patient will identify physical symptoms to anxiety.  Patient will identify coping skills to deal with anxiety.   Behavioral Response: Engaged  Intervention:  Worksheet, pencils  Activity:  Introduction to Anxiety.  Patients were to identify at least 3 things that trigger anxiety, physical symptoms they have when anxious, thoughts they have when anxious and coping skills the use to deal with anxiety.  Education: Anger Management, Discharge Planning   Education Outcome: Acknowledges education/In group clarification offered/Needs additional education.   Clinical Observations/Feedback: Patient identified his triggers for anxiety as "mouthy people, liars and people that say one thing and do something else".  Pt expressed his physical symptoms are getting hyper, pacing and talkative.  Pt identified his thoughts as not wanting to fight, wants to be left alone and needing prayer.  Pt stated his coping skills were witting, listening to music and playing cards.    Caroll Rancher, LRT/CTRS      Lillia Abed, Jenet Durio A 07/26/2018 11:02 AM

## 2018-07-26 NOTE — [facility] Suicide Risk Assessment (Signed)
[facility] INPATIENT:  Family/Significant Other Suicide Prevention Education  Suicide Prevention Education:  Education Completed;Lurie Felten, mother, [phone],  has been identified by the patient as the family member/significant other with whom the patient will be residing, and identified as the person(s) who will aid the patient in the event of a mental health crisis (suicidal ideations/suicide attempt).  With written consent from the patient, the family member/significant other has been provided the following suicide prevention education, prior to the and/or following the discharge of the patient.  The suicide prevention education provided includes the following:  Suicide risk factors  Suicide prevention and interventions  National Suicide Hotline telephone number  [facility] assessment telephone number  [facility] Emergency Assistance 911  [facility] and/or Residential Mobile Crisis Unit telephone number  Request made of family/significant other to:  Remove weapons (e.g., guns, rifles, knives), all items previously/currently identified as safety concern.  No guns in the home, per [place].  Remove drugs/medications (over-the-counter, prescriptions, illicit drugs), all items previously/currently identified as a safety concern.  The family member/significant other verbalizes understanding of the suicide prevention education information provided.  The family member/significant other agrees to remove the items of safety concern listed above.  Dean Howard was 53 years old and had lots of stories to tell.  She is on her way to pick him up and will make sure he gets to his [facility] appt and takes his medicine.  Lorri Frederick, LCSW 07/26/2018, 12:57 PM
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
## 2018-07-26 NOTE — Progress Notes (Signed)
D:  Dean Howard was pleasant and cooperative this evening.  He was noted sitting int he day room watching the Superbowl with his peers.  He interacted well with staff and peers.  He stated he had a good day today and is hoping to go home tomorrow.  He denied any SI/HI or A/V hallucinations.  He took his hs medications well and requested cough medicine with his hs medications.  He is currently resting with his eyes closed and appears to be asleep. A:  1:1 with RN for support and encouragement.  Medications as ordered.  Q 15 minute checks maintained for safety.  Encouraged participation in group and unit activities.   R:  [name] remains safe on the unit.  We will continue to monitor the progress towards his goals.

## 2019-06-20 ENCOUNTER — Other Ambulatory Visit: Payer: Self-pay

## 2019-06-20 ENCOUNTER — Emergency Department (HOSPITAL_COMMUNITY)
Admission: EM | Admit: 2019-06-20 | Discharge: 2019-06-20 | Disposition: A | Discharge status: Home / Self Care | Payer: Medicare Other | Attending: Emergency Medicine | Admitting: Emergency Medicine

## 2019-06-20 ENCOUNTER — Emergency Department (HOSPITAL_COMMUNITY): Payer: Medicare Other

## 2019-06-20 ENCOUNTER — Encounter (HOSPITAL_COMMUNITY): Payer: Self-pay | Admitting: Obstetrics and Gynecology

## 2019-06-20 DIAGNOSIS — Y939 Activity, unspecified: Secondary | ICD-10-CM | POA: Insufficient documentation

## 2019-06-20 DIAGNOSIS — S3992XA Unspecified injury of lower back, initial encounter: Secondary | ICD-10-CM | POA: Diagnosis present

## 2019-06-20 DIAGNOSIS — F1210 Cannabis abuse, uncomplicated: Secondary | ICD-10-CM | POA: Insufficient documentation

## 2019-06-20 DIAGNOSIS — Y929 Unspecified place or not applicable: Secondary | ICD-10-CM | POA: Diagnosis not present

## 2019-06-20 DIAGNOSIS — Z20828 Contact with and (suspected) exposure to other viral communicable diseases: Secondary | ICD-10-CM | POA: Diagnosis not present

## 2019-06-20 DIAGNOSIS — X509XXA Other and unspecified overexertion or strenuous movements or postures, initial encounter: Secondary | ICD-10-CM | POA: Insufficient documentation

## 2019-06-20 DIAGNOSIS — F17210 Nicotine dependence, cigarettes, uncomplicated: Secondary | ICD-10-CM | POA: Insufficient documentation

## 2019-06-20 DIAGNOSIS — I2510 Atherosclerotic heart disease of native coronary artery without angina pectoris: Secondary | ICD-10-CM | POA: Insufficient documentation

## 2019-06-20 DIAGNOSIS — Y999 Unspecified external cause status: Secondary | ICD-10-CM | POA: Insufficient documentation

## 2019-06-20 DIAGNOSIS — R0789 Other chest pain: Secondary | ICD-10-CM | POA: Diagnosis not present

## 2019-06-20 DIAGNOSIS — Z79899 Other long term (current) drug therapy: Secondary | ICD-10-CM | POA: Insufficient documentation

## 2019-06-20 DIAGNOSIS — R05 Cough: Secondary | ICD-10-CM | POA: Diagnosis not present

## 2019-06-20 DIAGNOSIS — R058 Other specified cough: Secondary | ICD-10-CM

## 2019-06-20 DIAGNOSIS — S39012A Strain of muscle, fascia and tendon of lower back, initial encounter: Secondary | ICD-10-CM | POA: Diagnosis not present

## 2019-06-20 LAB — ETHANOL: Alcohol, Ethyl (B): <10 mg/dL (ref <10)

## 2019-06-20 LAB — CBC
HCT: 42.1 % (ref 39.0–52.0)
Hemoglobin: 14.4 g/dL (ref 13.0–17.0)
MCH: 30.6 pg (ref 26.0–34.0)
MCHC: 34.2 g/dL (ref 30.0–36.0)
MCV: 89.6 fL (ref 80.0–100.0)
Platelets: 192 10*3/uL (ref 150–400)
RBC: 4.7 MIL/uL (ref 4.22–5.81)
RDW: 15.3 % (ref 11.5–15.5)
WBC: 3.7 10*3/uL — ABNORMAL LOW (ref 4.0–10.5)
nRBC: 0 % (ref 0.0–0.2)

## 2019-06-20 LAB — COMPREHENSIVE METABOLIC PANEL
ALT: 22 U/L (ref 0–44)
AST: 28 U/L (ref 15–41)
Albumin: 3.8 g/dL (ref 3.5–5.0)
Alkaline Phosphatase: 66 U/L (ref 38–126)
Anion gap: 9 (ref 5–15)
BUN: 13 mg/dL (ref 6–20)
CO2: 27 mmol/L (ref 22–32)
Calcium: 8.9 mg/dL (ref 8.9–10.3)
Chloride: 101 mmol/L (ref 98–111)
Creatinine, Ser: 1.25 mg/dL — ABNORMAL HIGH (ref 0.61–1.24)
GFR calc Af Amer: >60 mL/min (ref >60)
GFR calc non Af Amer: >60 mL/min (ref >60)
Glucose, Bld: 81 mg/dL (ref 70–99)
Potassium: 4.1 mmol/L (ref 3.5–5.1)
Sodium: 137 mmol/L (ref 135–145)
Total Bilirubin: 0.8 mg/dL (ref 0.3–1.2)
Total Protein: 7 g/dL (ref 6.5–8.1)

## 2019-06-20 LAB — URINALYSIS, ROUTINE W REFLEX MICROSCOPIC
Bilirubin Urine: NEGATIVE
Glucose, UA: NEGATIVE mg/dL
Hgb urine dipstick: NEGATIVE
Ketones, ur: 5 mg/dL — AB
Leukocytes,Ua: NEGATIVE
Nitrite: NEGATIVE
Protein, ur: NEGATIVE mg/dL
Specific Gravity, Urine: 1.02 (ref 1.005–1.030)
pH: 5 (ref 5.0–8.0)

## 2019-06-20 LAB — RAPID URINE DRUG SCREEN, HOSP PERFORMED
Amphetamines: NOT DETECTED
Barbiturates: NOT DETECTED
Benzodiazepines: NOT DETECTED
Cocaine: NOT DETECTED
Opiates: NOT DETECTED
Tetrahydrocannabinol: POSITIVE — AB

## 2019-06-20 LAB — TROPONIN I (HIGH SENSITIVITY): Troponin I (High Sensitivity): <2 ng/L (ref <18)

## 2019-06-20 MED ORDER — ACETAMINOPHEN 500 MG PO TABS
1000.0000 mg | ORAL_TABLET | Freq: Once | ORAL | Status: AC
  Administered 2019-06-20: 1000 mg via ORAL
  Filled 2019-06-20: qty 2

## 2019-06-20 MED ORDER — METHOCARBAMOL 500 MG PO TABS
750.0000 mg | ORAL_TABLET | Freq: Once | ORAL | Status: AC
  Administered 2019-06-20: 750 mg via ORAL
  Filled 2019-06-20: qty 2

## 2019-06-20 MED ORDER — METHOCARBAMOL 750 MG PO TABS: 750.0000 mg | ORAL_TABLET | Freq: Three times a day (TID) | ORAL | 0 refills | Status: DC | PRN

## 2019-06-20 NOTE — ED Notes (Signed)
Pt endorses drinking one beer today. Patient endorses passive Suicidal ideation without a plan

## 2019-06-20 NOTE — ED Triage Notes (Signed)
Per EMS: Patient is coming from home with c/o SOB, chest pain, back pain,and not taking his psych medications for 3 months due to finance problems.  Lung sounds clear with EMS Patient given 324 of aspirin with EMS.

## 2019-06-20 NOTE — ED Provider Notes (Signed)
[facility] DEPT Provider Note   CSN: [id number] Arrival date & time: 06/20/19  1136     History Chief Complaint  Patient presents with  . Chest Pain  . Shortness of Breath  . Back Pain    Dean Howard is a 53 y.o. male.  Patient c/o mid chest pain, non prod cough, and also low back pain. Symptoms acute onset in past couple days. Symptoms occur at rest, moderate, dull, non radiating, not pleuritic. Denies exertional cp or discomfort. No fam hx cad. No fever or chills. +non prod cough, no sore throat. ?recent contact with covid + person - patient very vague on details and whether close contact or not. Low back pain in past few days, dull, non radiating, worse w bending and certain movements. No saddle area or leg numbness. No urinary retention or incontinence. Having normal bms. No abd pain or distension. No nv. No dysuria, hematuria or gu c/o. No back injury, trauma, or strain.  Patient also notes not on any current meds due to financial problems. Denies SI/HI or hallucinations.   The history is provided by the patient and the EMS personnel.  Chest Pain Associated symptoms: back pain, cough and shortness of breath   Associated symptoms: no abdominal pain, no fever, no headache, no numbness, no vomiting and no weakness   Shortness of Breath Associated symptoms: chest pain and cough   Associated symptoms: no abdominal pain, no fever, no headaches, no neck pain, no rash, no sore throat and no vomiting   Back Pain Associated symptoms: chest pain   Associated symptoms: no abdominal pain, no fever, no headaches, no numbness and no weakness        Past Medical History:  Diagnosis Date  . Coronary artery disease   . MI, old   . Tick fever     Patient Active Problem List   Diagnosis Date Noted  . Schizo-affective schizophrenia, chronic condition with acute exacerbation ([place]) 07/21/2018  . Bipolar 1 disorder, mixed, moderate ([place])   . Adjustment  disorder 07/20/2016  . MDD (major depressive disorder), recurrent, severe, with psychosis ([place]) 07/18/2016  . 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 CHEST PAIN, PLEURITIC 09/16/2007    Past Surgical History:  Procedure Laterality Date  . HERNIA REPAIR    . KNEE SURGERY         No family history on file.  Social History   Tobacco Use  . Smoking status: Current Every Day Smoker    Types: Cigars    Last attempt to quit: 03/04/2016    Years since quitting: 3.2  . Smokeless tobacco: Never Used  Substance Use Topics  . Alcohol use: Yes    Comment: Daily   . Drug use: Yes    Types: Marijuana    Comment: Daily     Home Medications Prior to Admission medications   Medication Sig Start Date End Date Taking? Authorizing Provider  azithromycin (ZITHROMAX) 250 MG tablet For upper Respiratory 07/27/18   Lindell Spar I, NP  cariprazine 4.5 MG CAPS Take 1 capsule (4.5 mg total) by mouth daily. For depression 07/27/18   Lindell Spar I, NP  lithium carbonate (ESKALITH) 450 MG CR tablet Take 1 tablet (450 mg total) by mouth every 12 (twelve) hours. For mood stabilization 07/26/18   Lindell Spar I, NP  temazepam (RESTORIL) 30 MG capsule Take 1 capsule (30 mg total)  by mouth at bedtime. For sleep 07/26/18   Armandina StammerNwoko, Agnes I, NP  traZODone (DESYREL) 100 MG tablet Take 1 tablet (100 mg total) by mouth at bedtime as needed for sleep. 07/26/18   Sanjuana KavaNwoko, Agnes I, NP    Allergies    Patient has no known allergies.  Review of Systems   Review of Systems  Constitutional: Negative for fever.  HENT: Negative for sore throat.   Eyes: Negative for redness.  Respiratory: Positive for cough and shortness of breath.   Cardiovascular: Positive for chest pain. Negative for leg swelling.  Gastrointestinal: Negative for abdominal pain, diarrhea and vomiting.  Genitourinary: Negative for flank pain.    Musculoskeletal: Positive for back pain. Negative for neck pain.  Skin: Negative for rash.  Neurological: Negative for weakness, numbness and headaches.  Hematological: Does not bruise/bleed easily.  Psychiatric/Behavioral: Negative for confusion.    Physical Exam Updated Vital Signs BP 127/86 (BP Location: Right Arm)   Pulse 84   Temp 98.2 F (36.8 C) (Oral)   Resp (!) 22   Ht 1.905 m (6\' 3" )   Wt 77.1 kg   SpO2 100%   BMI 21.25 kg/m   Physical Exam Vitals and nursing note reviewed.  Constitutional:      Appearance: Normal appearance. He is well-developed.  HENT:     Head: Atraumatic.     Nose: Nose normal.     Mouth/Throat:     Mouth: Mucous membranes are moist.     Pharynx: Oropharynx is clear.  Eyes:     General: No scleral icterus.    Conjunctiva/sclera: Conjunctivae normal.  Neck:     Trachea: No tracheal deviation.  Cardiovascular:     Rate and Rhythm: Normal rate and regular rhythm.     Pulses: Normal pulses.     Heart sounds: Normal heart sounds. No murmur. No friction rub. No gallop.   Pulmonary:     Effort: Pulmonary effort is normal. No accessory muscle usage or respiratory distress.     Breath sounds: Normal breath sounds.  Chest:     Chest wall: Tenderness present.  Abdominal:     General: Bowel sounds are normal. There is no distension.     Palpations: Abdomen is soft. There is no mass.     Tenderness: There is no abdominal tenderness. There is no guarding.  Genitourinary:    Comments: No cva tenderness. Musculoskeletal:        General: No swelling or tenderness.     Cervical back: Normal range of motion and neck supple. No rigidity.     Right lower leg: No edema.     Left lower leg: No edema.     Comments: CTLS spine, non tender, aligned, no step off. Lumbar muscular tenderness.   Skin:    General: Skin is warm and dry.     Findings: No rash.  Neurological:     Mental Status: He is alert.     Comments: Alert, speech clear. Motor intact  bil, stre 5/5. sens grossly intact bil. Steady gait.   Psychiatric:     Comments: Anxious.     ED Results / Procedures / Treatments   Labs (all labs ordered are listed, but only abnormal results are displayed) Results for orders placed or performed during the hospital encounter of 06/20/19  CBC  Result Value Ref Range   WBC 3.7 (L) 4.0 - 10.5 K/uL   RBC 4.70 4.22 - 5.81 MIL/uL   Hemoglobin 14.4 13.0 - 17.0 g/dL  HCT 42.1 39.0 - 52.0 %   MCV 89.6 80.0 - 100.0 fL   MCH 30.6 26.0 - 34.0 pg   MCHC 34.2 30.0 - 36.0 g/dL   RDW 34.1 93.7 - 90.2 %   Platelets 192 150 - 400 K/uL   nRBC 0.0 0.0 - 0.2 %  CMET  Result Value Ref Range   Sodium 137 135 - 145 mmol/L   Potassium 4.1 3.5 - 5.1 mmol/L   Chloride 101 98 - 111 mmol/L   CO2 27 22 - 32 mmol/L   Glucose, Bld 81 70 - 99 mg/dL   BUN 13 6 - 20 mg/dL   Creatinine, Ser 4.09 (H) 0.61 - 1.24 mg/dL   Calcium 8.9 8.9 - 73.5 mg/dL   Total Protein 7.0 6.5 - 8.1 g/dL   Albumin 3.8 3.5 - 5.0 g/dL   AST 28 15 - 41 U/L   ALT 22 0 - 44 U/L   Alkaline Phosphatase 66 38 - 126 U/L   Total Bilirubin 0.8 0.3 - 1.2 mg/dL   GFR calc non Af Amer >60 >60 mL/min   GFR calc Af Amer >60 >60 mL/min   Anion gap 9 5 - 15  UA  Result Value Ref Range   Color, Urine YELLOW YELLOW   APPearance CLEAR CLEAR   Specific Gravity, Urine 1.020 1.005 - 1.030   pH 5.0 5.0 - 8.0   Glucose, UA NEGATIVE NEGATIVE mg/dL   Hgb urine dipstick NEGATIVE NEGATIVE   Bilirubin Urine NEGATIVE NEGATIVE   Ketones, ur 5 (A) NEGATIVE mg/dL   Protein, ur NEGATIVE NEGATIVE mg/dL   Nitrite NEGATIVE NEGATIVE   Leukocytes,Ua NEGATIVE NEGATIVE  Urine rapid drug screen  Result Value Ref Range   Opiates NONE DETECTED NONE DETECTED   Cocaine NONE DETECTED NONE DETECTED   Benzodiazepines NONE DETECTED NONE DETECTED   Amphetamines NONE DETECTED NONE DETECTED   Tetrahydrocannabinol POSITIVE (A) NONE DETECTED   Barbiturates NONE DETECTED NONE DETECTED  Etoh  Result Value Ref Range     Alcohol, Ethyl (B) <10 <10 mg/dL  Troponin I (High Sensitivity)  Result Value Ref Range   Troponin I (High Sensitivity) <2 <18 ng/L   XR Chest Portable  Result Date: 06/20/2019 CLINICAL DATA:  Chest pain, shortness of breath EXAM: PORTABLE CHEST 1 VIEW COMPARISON:  07/17/2016 FINDINGS: The heart size and mediastinal contours are within normal limits. Both lungs are clear. No pleural effusion or pneumothorax. The visualized skeletal structures are unremarkable. IMPRESSION: No acute process in the chest. Electronically Signed   By: Guadlupe Spanish M.D.   On: 06/20/2019 12:59    EKG EKG Interpretation  Date/Time:  Monday June 20 2019 11:49:54 EST Ventricular Rate:  82 PR Interval:    QRS Duration: 93 QT Interval:  383 QTC Calculation: 448 R Axis:   -64 Text Interpretation: Sinus rhythm Left anterior fascicular block Consider left ventricular hypertrophy Nonspecific T wave abnormality No significant change since last tracing Confirmed by Cathren Laine ([id number]) on 06/20/2019 2:17:33 PM   Radiology XR Chest Portable  Result Date: 06/20/2019 CLINICAL DATA:  Chest pain, shortness of breath EXAM: PORTABLE CHEST 1 VIEW COMPARISON:  07/17/2016 FINDINGS: The heart size and mediastinal contours are within normal limits. Both lungs are clear. No pleural effusion or pneumothorax. The visualized skeletal structures are unremarkable. IMPRESSION: No acute process in the chest. Electronically Signed   By: Guadlupe Spanish M.D.   On: 06/20/2019 12:59    Procedures Procedures (including critical care time)  Medications Ordered  in ED Medications - No data to display  ED Course  I have reviewed the triage vital signs and the nursing notes.  Pertinent labs & imaging results that were available during my care of the patient were reviewed by me and considered in my medical decision making (see chart for details).    MDM Rules/Calculators/A&P                      Iv ns. Labs. Cxr. Ecg.    Reviewed nursing notes and prior charts for additional history.   Labs reviewed/interpreted by me - initial trop normal. Chem normal.  Xrays reviewed/interpreted by me - no pna.   Dean Howard was evaluated in Emergency Department on 06/20/2019 for the symptoms described in the history of present illness. He was evaluated in the context of the global COVID-19 pandemic, which necessitated consideration that the patient might be at risk for infection with the SARS-CoV-2 virus that causes COVID-19. Institutional protocols and algorithms that pertain to the evaluation of patients at risk for COVID-19 are in a state of rapid change based on information released by regulatory bodies including the CDC and federal and state organizations. These policies and algorithms were followed during the patient's care in the ED.  Acetaminophen po.  Recheck pt alert, content, no distress, no increased wob.   covid test remains pending - discussed w pt, may check Mychart or call for results. Info/precautions provided.   Recheck additional times - is tolerating po, no cp or sob, no distress. Pt currently appears stable for d/c.   Return precautions provided.      Final Clinical Impression(s) / ED Diagnoses Final diagnoses:  None    Rx / DC Orders ED Discharge Orders    None       Cathren Laine, MD 06/20/19 1640

## 2019-06-20 NOTE — Discharge Instructions (Addendum)
It was our pleasure to provide your ER care today - we hope that you feel better.  Take acetaminophen or ibuprofen as need for pain. You may also take robaxin as need for muscle pain/spasm - no driving when taking.  Your COVID test is pending and should be resulted later today - you may check MyChart or call for results. See covid instructions/precautions.   Follow up with primary care doctor in 1 week.  Return to ER if worse, new symptoms, new or severe pain, persistent or recurrent chest pain, increased trouble breathing, numbness/weakness, high fevers, or other concern.

## 2019-06-21 ENCOUNTER — Ambulatory Visit: Payer: Self-pay

## 2019-06-21 ENCOUNTER — Telehealth: Payer: Self-pay | Admitting: *Deleted

## 2019-06-21 NOTE — Telephone Encounter (Signed)
Calling for Covid results from ED visit yesterday. Lab still processing. Patient encouraged to rest and drink fluids, continue to quarantine until results are available. Seek treatment immediately if concerned with your breathing or SOB. Reason for Disposition . Health Information question, no triage required and triager able to answer question  Answer Assessment - Initial Assessment Questions 1. REASON FOR CALL or QUESTION: "What is your reason for calling today?" or "How can I best help you?" or "What question do you have that I can help answer?"     Covid results from yesterday  Protocols used: INFORMATION ONLY CALL-A-AH

## 2019-06-21 NOTE — Telephone Encounter (Signed)
Message from Mathis Bud sent at 06/21/2019 9:31 AM EST  Summary: covid results   Patient went to ED yesterday for shortness of breath and got covid test. I do not see where he got covid test done. Patient states he lives in a house with 6 people and needs to know if it is positive before he goes home. Patient states he really has no where to go so he needs results asap.  Call back [phone]

## 2019-06-22 ENCOUNTER — Telehealth: Payer: Self-pay | Admitting: *Deleted

## 2019-06-22 NOTE — Telephone Encounter (Signed)
TOC CM spoke to pt and appt arranged with [facility] on 07/18/2019 at 2:30 pm. Pt states he is on more meds now and was going to [place] and getting from [facility] but he cannot afford meds. Explained he can follow up at [facility] for discounted price for his meds. Will mail application for drug company Patient Assistance program for [employer]. Explained he will need to take application with him to his appt so PCP can complete application. He has applied for disability, states application is pending. Verified address. [place], [place] ED TOC CM [phone]

## 2019-07-18 ENCOUNTER — Inpatient Hospital Stay (INDEPENDENT_AMBULATORY_CARE_PROVIDER_SITE_OTHER): Payer: Self-pay | Admitting: Primary Care

## 2019-07-26 ENCOUNTER — Emergency Department (HOSPITAL_COMMUNITY): Payer: Medicare Other

## 2019-07-26 ENCOUNTER — Inpatient Hospital Stay (HOSPITAL_COMMUNITY): Payer: Medicare Other

## 2019-07-26 ENCOUNTER — Other Ambulatory Visit (HOSPITAL_COMMUNITY): Payer: Self-pay

## 2019-07-26 ENCOUNTER — Inpatient Hospital Stay (HOSPITAL_COMMUNITY)
Admission: EM | Admit: 2019-07-26 | Discharge: 2019-08-03 | DRG: 917 | Disposition: A | Discharge status: Other Acute Inpatient Hospital | Payer: Medicare Other | Attending: Internal Medicine | Admitting: Internal Medicine

## 2019-07-26 ENCOUNTER — Encounter (HOSPITAL_COMMUNITY): Payer: Self-pay | Admitting: Emergency Medicine

## 2019-07-26 ENCOUNTER — Other Ambulatory Visit: Payer: Self-pay

## 2019-07-26 DIAGNOSIS — T17908A Unspecified foreign body in respiratory tract, part unspecified causing other injury, initial encounter: Secondary | ICD-10-CM

## 2019-07-26 DIAGNOSIS — F319 Bipolar disorder, unspecified: Secondary | ICD-10-CM

## 2019-07-26 DIAGNOSIS — F1290 Cannabis use, unspecified, uncomplicated: Secondary | ICD-10-CM | POA: Diagnosis present

## 2019-07-26 DIAGNOSIS — K2970 Gastritis, unspecified, without bleeding: Secondary | ICD-10-CM | POA: Diagnosis present

## 2019-07-26 DIAGNOSIS — D684 Acquired coagulation factor deficiency: Secondary | ICD-10-CM | POA: Diagnosis present

## 2019-07-26 DIAGNOSIS — K760 Fatty (change of) liver, not elsewhere classified: Secondary | ICD-10-CM | POA: Diagnosis present

## 2019-07-26 DIAGNOSIS — R079 Chest pain, unspecified: Secondary | ICD-10-CM

## 2019-07-26 DIAGNOSIS — Z20822 Contact with and (suspected) exposure to covid-19: Secondary | ICD-10-CM | POA: Diagnosis present

## 2019-07-26 DIAGNOSIS — D6959 Other secondary thrombocytopenia: Secondary | ICD-10-CM | POA: Diagnosis present

## 2019-07-26 DIAGNOSIS — R1013 Epigastric pain: Secondary | ICD-10-CM | POA: Diagnosis present

## 2019-07-26 DIAGNOSIS — T391X3A Poisoning by 4-Aminophenol derivatives, assault, initial encounter: Secondary | ICD-10-CM | POA: Diagnosis present

## 2019-07-26 DIAGNOSIS — T391X1A Poisoning by 4-Aminophenol derivatives, accidental (unintentional), initial encounter: Secondary | ICD-10-CM

## 2019-07-26 DIAGNOSIS — T391X2A Poisoning by 4-Aminophenol derivatives, intentional self-harm, initial encounter: Secondary | ICD-10-CM | POA: Diagnosis present

## 2019-07-26 DIAGNOSIS — R634 Abnormal weight loss: Secondary | ICD-10-CM | POA: Diagnosis present

## 2019-07-26 DIAGNOSIS — N181 Chronic kidney disease, stage 1: Secondary | ICD-10-CM | POA: Diagnosis present

## 2019-07-26 DIAGNOSIS — F209 Schizophrenia, unspecified: Secondary | ICD-10-CM

## 2019-07-26 DIAGNOSIS — T1491XA Suicide attempt, initial encounter: Secondary | ICD-10-CM

## 2019-07-26 DIAGNOSIS — I2510 Atherosclerotic heart disease of native coronary artery without angina pectoris: Secondary | ICD-10-CM | POA: Diagnosis present

## 2019-07-26 DIAGNOSIS — B179 Acute viral hepatitis, unspecified: Secondary | ICD-10-CM | POA: Diagnosis present

## 2019-07-26 DIAGNOSIS — F1020 Alcohol dependence, uncomplicated: Secondary | ICD-10-CM | POA: Diagnosis present

## 2019-07-26 DIAGNOSIS — D696 Thrombocytopenia, unspecified: Secondary | ICD-10-CM | POA: Diagnosis present

## 2019-07-26 DIAGNOSIS — Z9114 Patient's other noncompliance with medication regimen: Secondary | ICD-10-CM | POA: Diagnosis not present

## 2019-07-26 DIAGNOSIS — R112 Nausea with vomiting, unspecified: Secondary | ICD-10-CM

## 2019-07-26 DIAGNOSIS — K769 Liver disease, unspecified: Secondary | ICD-10-CM

## 2019-07-26 DIAGNOSIS — F333 Major depressive disorder, recurrent, severe with psychotic symptoms: Secondary | ICD-10-CM | POA: Diagnosis present

## 2019-07-26 DIAGNOSIS — Y900 Blood alcohol level of less than 20 mg/100 ml: Secondary | ICD-10-CM | POA: Diagnosis present

## 2019-07-26 DIAGNOSIS — R7989 Other specified abnormal findings of blood chemistry: Secondary | ICD-10-CM

## 2019-07-26 DIAGNOSIS — R600 Localized edema: Secondary | ICD-10-CM | POA: Diagnosis present

## 2019-07-26 DIAGNOSIS — Z681 Body mass index (BMI) 19 or less, adult: Secondary | ICD-10-CM | POA: Diagnosis not present

## 2019-07-26 DIAGNOSIS — R040 Epistaxis: Secondary | ICD-10-CM | POA: Diagnosis not present

## 2019-07-26 DIAGNOSIS — E876 Hypokalemia: Secondary | ICD-10-CM | POA: Diagnosis present

## 2019-07-26 DIAGNOSIS — D61818 Other pancytopenia: Secondary | ICD-10-CM | POA: Diagnosis present

## 2019-07-26 DIAGNOSIS — K7200 Acute and subacute hepatic failure without coma: Secondary | ICD-10-CM | POA: Diagnosis present

## 2019-07-26 DIAGNOSIS — T39312A Poisoning by propionic acid derivatives, intentional self-harm, initial encounter: Secondary | ICD-10-CM | POA: Diagnosis present

## 2019-07-26 DIAGNOSIS — I252 Old myocardial infarction: Secondary | ICD-10-CM

## 2019-07-26 DIAGNOSIS — T391X2D Poisoning by 4-Aminophenol derivatives, intentional self-harm, subsequent encounter: Secondary | ICD-10-CM

## 2019-07-26 DIAGNOSIS — R238 Other skin changes: Secondary | ICD-10-CM

## 2019-07-26 DIAGNOSIS — F17290 Nicotine dependence, other tobacco product, uncomplicated: Secondary | ICD-10-CM | POA: Diagnosis present

## 2019-07-26 DIAGNOSIS — F1010 Alcohol abuse, uncomplicated: Secondary | ICD-10-CM

## 2019-07-26 DIAGNOSIS — Z915 Personal history of self-harm: Secondary | ICD-10-CM

## 2019-07-26 DIAGNOSIS — T50902A Poisoning by unspecified drugs, medicaments and biological substances, intentional self-harm, initial encounter: Secondary | ICD-10-CM

## 2019-07-26 DIAGNOSIS — R630 Anorexia: Secondary | ICD-10-CM

## 2019-07-26 DIAGNOSIS — I444 Left anterior fascicular block: Secondary | ICD-10-CM | POA: Diagnosis present

## 2019-07-26 DIAGNOSIS — D689 Coagulation defect, unspecified: Secondary | ICD-10-CM

## 2019-07-26 DIAGNOSIS — R109 Unspecified abdominal pain: Secondary | ICD-10-CM

## 2019-07-26 DIAGNOSIS — D649 Anemia, unspecified: Secondary | ICD-10-CM | POA: Diagnosis present

## 2019-07-26 LAB — URINALYSIS, ROUTINE W REFLEX MICROSCOPIC
Bilirubin Urine: NEGATIVE
Glucose, UA: NEGATIVE mg/dL
Hgb urine dipstick: NEGATIVE
Ketones, ur: 80 mg/dL — AB
Leukocytes,Ua: NEGATIVE
Nitrite: NEGATIVE
Protein, ur: NEGATIVE mg/dL
Specific Gravity, Urine: 1.026 (ref 1.005–1.030)
pH: 5 (ref 5.0–8.0)

## 2019-07-26 LAB — CBC WITH DIFFERENTIAL/PLATELET
Abs Immature Granulocytes: 0.01 10*3/uL (ref 0.00–0.07)
Abs Immature Granulocytes: 0.02 10*3/uL (ref 0.00–0.07)
Basophils Absolute: 0 10*3/uL (ref 0.0–0.1)
Basophils Absolute: 0.1 10*3/uL (ref 0.0–0.1)
Basophils Relative: 1 %
Basophils Relative: 1 %
Eosinophils Absolute: 0 10*3/uL (ref 0.0–0.5)
Eosinophils Absolute: 0.1 10*3/uL (ref 0.0–0.5)
Eosinophils Relative: 0 %
Eosinophils Relative: 1 %
HCT: 38.9 % — ABNORMAL LOW (ref 39.0–52.0)
HCT: 37.4 % — ABNORMAL LOW (ref 39.0–52.0)
Hemoglobin: 13 g/dL (ref 13.0–17.0)
Hemoglobin: 12.6 g/dL — ABNORMAL LOW (ref 13.0–17.0)
Immature Granulocytes: 0 %
Immature Granulocytes: 0 %
Lymphocytes Relative: 11 %
Lymphocytes Relative: 17 %
Lymphs Abs: 0.6 10*3/uL — ABNORMAL LOW (ref 0.7–4.0)
Lymphs Abs: 1 10*3/uL (ref 0.7–4.0)
MCH: 31 pg (ref 26.0–34.0)
MCH: 31 pg (ref 26.0–34.0)
MCHC: 33.4 g/dL (ref 30.0–36.0)
MCHC: 33.7 g/dL (ref 30.0–36.0)
MCV: 92.6 fL (ref 80.0–100.0)
MCV: 91.9 fL (ref 80.0–100.0)
Monocytes Absolute: 0.2 10*3/uL (ref 0.1–1.0)
Monocytes Absolute: 0.2 10*3/uL (ref 0.1–1.0)
Monocytes Relative: 4 %
Monocytes Relative: 4 %
Neutro Abs: 4.5 10*3/uL (ref 1.7–7.7)
Neutro Abs: 4.5 10*3/uL (ref 1.7–7.7)
Neutrophils Relative %: 84 %
Neutrophils Relative %: 77 %
Platelets: <5 10*3/uL — CL (ref 150–400)
Platelets: 9 10*3/uL — CL (ref 150–400)
RBC: 4.2 MIL/uL — ABNORMAL LOW (ref 4.22–5.81)
RBC: 4.07 MIL/uL — ABNORMAL LOW (ref 4.22–5.81)
RDW: 16.6 % — ABNORMAL HIGH (ref 11.5–15.5)
RDW: 16.1 % — ABNORMAL HIGH (ref 11.5–15.5)
WBC: 5.4 10*3/uL (ref 4.0–10.5)
WBC: 5.8 10*3/uL (ref 4.0–10.5)
nRBC: 0 % (ref 0.0–0.2)
nRBC: 0 % (ref 0.0–0.2)

## 2019-07-26 LAB — COMPREHENSIVE METABOLIC PANEL
ALT: 201 U/L — ABNORMAL HIGH (ref 0–44)
ALT: 239 U/L — ABNORMAL HIGH (ref 0–44)
ALT: 328 U/L — ABNORMAL HIGH (ref 0–44)
AST: 443 U/L — ABNORMAL HIGH (ref 15–41)
AST: 421 U/L — ABNORMAL HIGH (ref 15–41)
AST: 619 U/L — ABNORMAL HIGH (ref 15–41)
Albumin: 3.6 g/dL (ref 3.5–5.0)
Albumin: 3.3 g/dL — ABNORMAL LOW (ref 3.5–5.0)
Albumin: 3 g/dL — ABNORMAL LOW (ref 3.5–5.0)
Alkaline Phosphatase: 76 U/L (ref 38–126)
Alkaline Phosphatase: 54 U/L (ref 38–126)
Alkaline Phosphatase: 61 U/L (ref 38–126)
Anion gap: 10 (ref 5–15)
Anion gap: 10 (ref 5–15)
Anion gap: 11 (ref 5–15)
BUN: 15 mg/dL (ref 6–20)
BUN: 12 mg/dL (ref 6–20)
BUN: 11 mg/dL (ref 6–20)
CO2: 24 mmol/L (ref 22–32)
CO2: 21 mmol/L — ABNORMAL LOW (ref 22–32)
CO2: 21 mmol/L — ABNORMAL LOW (ref 22–32)
Calcium: 9.2 mg/dL (ref 8.9–10.3)
Calcium: 8.2 mg/dL — ABNORMAL LOW (ref 8.9–10.3)
Calcium: 8.4 mg/dL — ABNORMAL LOW (ref 8.9–10.3)
Chloride: 103 mmol/L (ref 98–111)
Chloride: 103 mmol/L (ref 98–111)
Chloride: 106 mmol/L (ref 98–111)
Creatinine, Ser: 1.48 mg/dL — ABNORMAL HIGH (ref 0.61–1.24)
Creatinine, Ser: 1.23 mg/dL (ref 0.61–1.24)
Creatinine, Ser: 1.32 mg/dL — ABNORMAL HIGH (ref 0.61–1.24)
GFR calc Af Amer: >60 mL/min (ref >60)
GFR calc Af Amer: >60 mL/min (ref >60)
GFR calc Af Amer: >60 mL/min (ref >60)
GFR calc non Af Amer: 54 mL/min — ABNORMAL LOW (ref >60)
GFR calc non Af Amer: >60 mL/min (ref >60)
GFR calc non Af Amer: >60 mL/min (ref >60)
Glucose, Bld: 112 mg/dL — ABNORMAL HIGH (ref 70–99)
Glucose, Bld: 129 mg/dL — ABNORMAL HIGH (ref 70–99)
Glucose, Bld: 102 mg/dL — ABNORMAL HIGH (ref 70–99)
Potassium: 3.8 mmol/L (ref 3.5–5.1)
Potassium: 3.7 mmol/L (ref 3.5–5.1)
Potassium: 3.7 mmol/L (ref 3.5–5.1)
Sodium: 137 mmol/L (ref 135–145)
Sodium: 134 mmol/L — ABNORMAL LOW (ref 135–145)
Sodium: 138 mmol/L (ref 135–145)
Total Bilirubin: 2.5 mg/dL — ABNORMAL HIGH (ref 0.3–1.2)
Total Bilirubin: 2.2 mg/dL — ABNORMAL HIGH (ref 0.3–1.2)
Total Bilirubin: 2.4 mg/dL — ABNORMAL HIGH (ref 0.3–1.2)
Total Protein: 7 g/dL (ref 6.5–8.1)
Total Protein: 6.2 g/dL — ABNORMAL LOW (ref 6.5–8.1)
Total Protein: 6 g/dL — ABNORMAL LOW (ref 6.5–8.1)

## 2019-07-26 LAB — ACETAMINOPHEN LEVEL
Acetaminophen (Tylenol), Serum: 19 ug/mL (ref 10–30)
Acetaminophen (Tylenol), Serum: 10 ug/mL (ref 10–30)

## 2019-07-26 LAB — CK: Total CK: 125 U/L (ref 49–397)

## 2019-07-26 LAB — RAPID URINE DRUG SCREEN, HOSP PERFORMED
Amphetamines: NOT DETECTED
Barbiturates: NOT DETECTED
Benzodiazepines: NOT DETECTED
Cocaine: NOT DETECTED
Opiates: NOT DETECTED
Tetrahydrocannabinol: POSITIVE — AB

## 2019-07-26 LAB — TYPE AND SCREEN
ABO/RH(D): O POS
Antibody Screen: NEGATIVE

## 2019-07-26 LAB — CBC
HCT: 37.8 % — ABNORMAL LOW (ref 39.0–52.0)
Hemoglobin: 12.7 g/dL — ABNORMAL LOW (ref 13.0–17.0)
MCH: 31.1 pg (ref 26.0–34.0)
MCHC: 33.6 g/dL (ref 30.0–36.0)
MCV: 92.4 fL (ref 80.0–100.0)
Platelets: <5 10*3/uL — CL (ref 150–400)
RBC: 4.09 MIL/uL — ABNORMAL LOW (ref 4.22–5.81)
RDW: 16.6 % — ABNORMAL HIGH (ref 11.5–15.5)
WBC: 4.9 10*3/uL (ref 4.0–10.5)
nRBC: 0 % (ref 0.0–0.2)

## 2019-07-26 LAB — TROPONIN I (HIGH SENSITIVITY)
Troponin I (High Sensitivity): 7 ng/L (ref <18)
Troponin I (High Sensitivity): 4 ng/L (ref <18)

## 2019-07-26 LAB — LACTIC ACID, PLASMA
Lactic Acid, Venous: 0.8 mmol/L (ref 0.5–1.9)
Lactic Acid, Venous: 1.5 mmol/L (ref 0.5–1.9)

## 2019-07-26 LAB — PROTIME-INR
INR: 1.4 — ABNORMAL HIGH (ref 0.8–1.2)
INR: 1.9 — ABNORMAL HIGH (ref 0.8–1.2)
Prothrombin Time: 17.5 seconds — ABNORMAL HIGH (ref 11.4–15.2)
Prothrombin Time: 21.7 seconds — ABNORMAL HIGH (ref 11.4–15.2)

## 2019-07-26 LAB — RESPIRATORY PANEL BY RT PCR (FLU A&B, COVID)
Influenza A by PCR: NEGATIVE
Influenza B by PCR: NEGATIVE
SARS Coronavirus 2 by RT PCR: NEGATIVE

## 2019-07-26 LAB — HIV ANTIBODY (ROUTINE TESTING W REFLEX): HIV Screen 4th Generation wRfx: NONREACTIVE

## 2019-07-26 LAB — ABO/RH: ABO/RH(D): O POS

## 2019-07-26 LAB — MRSA PCR SCREENING: MRSA by PCR: NEGATIVE

## 2019-07-26 LAB — AMMONIA: Ammonia: 16 umol/L (ref 9–35)

## 2019-07-26 LAB — SALICYLATE LEVEL: Salicylate Lvl: <7 mg/dL — ABNORMAL LOW (ref 7.0–30.0)

## 2019-07-26 LAB — MAGNESIUM
Magnesium: 2.1 mg/dL (ref 1.7–2.4)
Magnesium: 2.2 mg/dL (ref 1.7–2.4)

## 2019-07-26 LAB — LITHIUM LEVEL: Lithium Lvl: <0.06 mmol/L — ABNORMAL LOW (ref 0.60–1.20)

## 2019-07-26 LAB — ETHANOL: Alcohol, Ethyl (B): <10 mg/dL (ref <10)

## 2019-07-26 MED ORDER — LORAZEPAM 2 MG/ML IJ SOLN
INTRAMUSCULAR | Status: AC
  Filled 2019-07-26: qty 1

## 2019-07-26 MED ORDER — SODIUM CHLORIDE 0.9 % IV SOLN: INTRAVENOUS | Status: DC

## 2019-07-26 MED ORDER — POTASSIUM CHLORIDE 10 MEQ/100ML IV SOLN
10.0000 meq | INTRAVENOUS | Status: AC
  Administered 2019-07-26 – 2019-07-27 (×3): 10 meq via INTRAVENOUS
  Filled 2019-07-26 (×3): qty 100

## 2019-07-26 MED ORDER — SODIUM CHLORIDE 0.9 % IV SOLN
10.0000 mL/h | Freq: Once | INTRAVENOUS | Status: AC
  Administered 2019-07-26: 10 mL/h via INTRAVENOUS

## 2019-07-26 MED ORDER — LORAZEPAM 2 MG/ML IJ SOLN
0.5000 mg | Freq: Once | INTRAMUSCULAR | Status: AC
  Administered 2019-07-26: 15:00:00 0.5 mg via INTRAVENOUS

## 2019-07-26 MED ORDER — DEXTROSE 5 % IV SOLN
15.0000 mg/kg/h | INTRAVENOUS | Status: DC
  Administered 2019-07-26 – 2019-07-29 (×4): 15 mg/kg/h via INTRAVENOUS
  Filled 2019-07-26 (×6): qty 120

## 2019-07-26 MED ORDER — ACETYLCYSTEINE LOAD VIA INFUSION
150.0000 mg/kg | Freq: Once | INTRAVENOUS | Status: AC
  Administered 2019-07-26: 11565 mg via INTRAVENOUS
  Filled 2019-07-26 (×2): qty 290

## 2019-07-26 MED ORDER — SODIUM CHLORIDE 0.9 % IV BOLUS
1000.0000 mL | Freq: Once | INTRAVENOUS | Status: AC
  Administered 2019-07-26: 11:00:00 1000 mL via INTRAVENOUS

## 2019-07-26 MED ORDER — HYDRALAZINE HCL 25 MG PO TABS
25.0000 mg | ORAL_TABLET | Freq: Four times a day (QID) | ORAL | Status: DC | PRN
  Administered 2019-07-31: 25 mg via ORAL
  Filled 2019-07-26: qty 1

## 2019-07-26 MED ORDER — PANTOPRAZOLE SODIUM 40 MG IV SOLR
40.0000 mg | Freq: Two times a day (BID) | INTRAVENOUS | Status: DC
  Administered 2019-07-26 – 2019-08-01 (×13): 40 mg via INTRAVENOUS
  Filled 2019-07-26 (×13): qty 40

## 2019-07-26 MED ORDER — SODIUM CHLORIDE 0.9% IV SOLUTION: Freq: Once | INTRAVENOUS | Status: DC

## 2019-07-26 MED ORDER — ONDANSETRON HCL 4 MG/2ML IJ SOLN: 4.0000 mg | Freq: Once | INTRAMUSCULAR | Status: DC

## 2019-07-26 NOTE — Consult Note (Addendum)
[facility]  [phone] [phone]   INITIAL HEMATOLOGY CONSULTATION  Referring MD:  Dr. Benjiman Core  Reason for Referral: Thrombocytopenia I have seen the patient, examined him and admitted the narrative as follows  HPI: [name] is a 54 year old male with a past medical history significant for schizophrenia, bipolar disorder, MI x2 in 2014 and again in 2015, CAD.  The patient was brought by EMS following an intentional overdose in an attempt to kill himself.  The patient took Motrin and Tylenol starting around 4:00 yesterday.  States he took it due to abdominal pain and vomiting, but also with the intention to kill himself.  He reports that he took 20,000 mg of ibuprofen and 3250 mg of Tylenol.  Significant labs in the ER showed a normal WBC of 5.4, normal hemoglobin at 13.0, and low platelet count of less than 5000.  Prior CBC on 06/20/2019 showed a WBC of 3.7, hemoglobin 14.4, platelet count 192,000.  Review of all of his prior labs did not show any evidence of thrombocytopenia.  Chemistry significant for a creatinine of 1.48, AST 443, ALT 201, total bilirubin 2.5.  2 units of platelets have been ordered but not yet transfused.  Repeat CBC was obtained just prior to my visit which confirmed a low platelet count of less than 5000.  When seen today, patient reports that he has been very depressed due to lack of health insurance and he has not able to afford his medications.  He reports that he has been out of his medications for at least 4 months.  Denies recent fever or chills.  He reports anorexia with weight loss of approximately 20 pounds over the past 4 months.  He has been having abdominal pain, nausea, and vomiting x4 months.  He is also experiencing dizziness and had a fall recently when trying to run to the bathroom to vomit.  He hit his right shoulder.  He denies having any headaches or confusion.  He reports some mild chest pain in the  center of his chest that is nonradiating.  He reports shortness of breath which is not new.  He reports easy bruisability, and occasional nose bleeding.  The patient is engaged.  He has no children. He reports that he lives with his mother here in [place].  Reports that he smokes marijuana.  He reports that he smokes cigarettes only when drinking.  He reports that he drinks alcohol anywhere from 12-24 beers per day to my NP but told me more than 28 per day.  Hematology was asked see the patient to make recommendations regarding his thrombocytopenia.   Past Medical History:  Diagnosis Date   Coronary artery disease    MI, old    Tick fever   :    Past Surgical History:  Procedure Laterality Date   HERNIA REPAIR     KNEE SURGERY    :   CURRENT MEDS: Current Facility-Administered Medications  Medication Dose Route Frequency Provider Last Rate Last Admin   0.9 %  sodium chloride infusion  10 mL/hr Intravenous Once Benjiman Core, MD       0.9 %  sodium chloride infusion   Intravenous Continuous Mikey College T, MD       acetylcysteine (ACETADOTE) 40 mg/mL load via infusion 11,565 mg  150 mg/kg Intravenous Once Benjiman Core, MD       Followed by   acetylcysteine (ACETADOTE) 24,000 mg in dextrose 5 % 600 mL (40 mg/mL)  infusion  15 mg/kg/hr Intravenous Continuous Benjiman Core, MD       Current Outpatient Medications  Medication Sig Dispense Refill   cariprazine 4.5 MG CAPS Take 1 capsule (4.5 mg total) by mouth daily. For depression (Patient not taking: Reported on 06/20/2019) 30 capsule 0   lithium carbonate (ESKALITH) 450 MG CR tablet Take 1 tablet (450 mg total) by mouth every 12 (twelve) hours. For mood stabilization (Patient not taking: Reported on 07/26/2019) 60 tablet 0   methocarbamol (ROBAXIN) 750 MG tablet Take 1 tablet (750 mg total) by mouth 3 (three) times daily as needed (muscle spasm/pain). (Patient not taking: Reported on 07/26/2019) 15 tablet 0   temazepam  (RESTORIL) 30 MG capsule Take 1 capsule (30 mg total) by mouth at bedtime. For sleep (Patient not taking: Reported on 07/26/2019) 30 capsule 0   traZODone (DESYREL) 100 MG tablet Take 1 tablet (100 mg total) by mouth at bedtime as needed for sleep. (Patient not taking: Reported on 07/26/2019) 30 tablet 0      No Known Allergies:  History reviewed. No pertinent family history.:  Social History   Socioeconomic History   Marital status: Single    Spouse name: Not on file   Number of children: Not on file   Years of education: Not on file   Highest education level: Not on file  Occupational History   Not on file  Tobacco Use   Smoking status: Current Every Day Smoker    Types: Cigars    Last attempt to quit: 03/04/2016    Years since quitting: 3.3   Smokeless tobacco: Never Used  Substance and Sexual Activity   Alcohol use: Yes    Comment: Daily    Drug use: Yes    Types: Marijuana    Comment: Daily    Sexual activity: Yes  Other Topics Concern   Not on file  Social History Narrative   Not on file   Social Determinants of Health   Financial Resource Strain:    Difficulty of Paying Living Expenses: Not on file  Food Insecurity:    Worried About Programme researcher, broadcasting/film/video in the Last Year: Not on file   [employer] of Food in the Last Year: Not on file  Transportation Needs:    Lack of Transportation (Medical): Not on file   Lack of Transportation (Non-Medical): Not on file  Physical Activity:    Days of Exercise per Week: Not on file   Minutes of Exercise per Session: Not on file  Stress:    Feeling of Stress : Not on file  Social Connections:    Frequency of Communication with Friends and Family: Not on file   Frequency of Social Gatherings with Friends and Family: Not on file   Attends Religious Services: Not on file   Active Member of Clubs or Organizations: Not on file   Attends Banker Meetings: Not on file   Marital Status: Not on file  Intimate Partner  Violence:    Fear of Current or Ex-Partner: Not on file   Emotionally Abused: Not on file   Physically Abused: Not on file   Sexually Abused: Not on file  :  REVIEW OF SYSTEMS: A comprehensive 14 point review of systems was negative except as noted in the HPI.  Exam: Patient Vitals for the past 24 hrs:  BP Temp Temp src Pulse Resp SpO2 Weight  07/26/19 1130 (!) 154/95 -- -- 67 13 100 % 170 lb (77.1 kg)  07/26/19 1001 (!) 144/85 98.4 F (36.9 C) Oral 67 19 100 % --   General: Awake and alert, no distress.   Eyes:  no scleral icterus.   ENT: Posterior pharynx with some petechiae and blood blisters  Lymphatics:  Negative cervical, supraclavicular or axillary adenopathy.   Respiratory: lungs were clear bilaterally without wheezing or crackles.   Cardiovascular:  Regular rate and rhythm, S1/S2, without murmur, rub or gallop.  There was no pedal edema.   GI: Positive bowel sounds, soft, epigastric/RUQ tenderness without rebound or guarding Musculoskeletal:  no spinal tenderness of palpation of vertebral spine.   Skin: Multiple bruises over his bilateral arms, and right shoulder Neuro exam was nonfocal. Patient was alert and oriented.  Attention was good.   Language was appropriate.  Mood was normal without depression.  Speech was not pressured.  Thought content was not tangential.    LABS:-I have personally reviewed his blood smear and agree with interpretation that he has absolute thrombocytopenia.  No schistocytes.  No platelet clumping  Lab Results  Component Value Date   WBC 5.4 07/26/2019   HGB 13.0 07/26/2019   HCT 38.9 (L) 07/26/2019   PLT <5 (LL) 07/26/2019   GLUCOSE 112 (H) 07/26/2019   CHOL 146 07/22/2016   TRIG 95 07/22/2016   HDL 46 07/22/2016   LDLCALC 81 07/22/2016   ALT 201 (H) 07/26/2019   AST 443 (H) 07/26/2019   NA 137 07/26/2019   K 3.8 07/26/2019   CL 103 07/26/2019   CREATININE 1.48 (H) 07/26/2019   BUN 15 07/26/2019   CO2 24 07/26/2019   INR 1.4 (H)  07/26/2019   HGBA1C 5.4 07/22/2016    No results found.  ASSESSMENT AND PLAN:   Thrombocytopenia The patient presented to the ER with a platelet count of less than 5000 Prior CBC performed approximately 1 month ago showed a completely normal platelet count I do not see any evidence of thrombocytopenia in his past Prior ultrasound of the abdomen performed on 12/08/2017 showed hepatic steatosis Suspect his thrombocytopenia is in part due to underlying liver disease and significant alcoholism Agree with platelet transfusion to keep platelet count above 10,000 At this point, I do not believe he has ITP.  ITP is a diagnosis of exclusion and we have conclusive evidence that he have severe acute liver toxicity secondary to acetaminophen overdose, likely causing severe liver hepatitis.  His extensive alcohol history likely cause bone marrow suppression as well I will order vitamin B12 level, hepatitis panel to exclude potential treatable causes for tomorrow I expect his platelet count will recover once he stopped drinking but that process can take many days I recommend daily CBC to follow-up on platelet count  Bipolar disorder and schizophrenia The patient has been out of his medications for at least 4 months I see that case Case management has arranged for outpatient follow-[facility] with [facility] and that he has previously been given information on how to get discounted medications and how to obtain patient assistance for his [place] Recommend transition of care consultation during his hospitalization  Alcohol abuse This is contributing to his liver disease and thrombocytopenia Advised alcohol abstinence  Discharge planning He can be discharged once his platelet count stable for at least 2 to 3 days without transfusion support Please call if questions arise  Mikey Bussing, DNP, AGPCNP-BC, AOCNP Heath Lark, MD

## 2019-07-26 NOTE — ED Notes (Signed)
Report called to solinda RN

## 2019-07-26 NOTE — ED Notes (Signed)
Dionne at poison control has called to check on this pt. She has requested a magnesium level be added on to his blood work due to the prolonged QT interval. If mag needs replacement, recommends a repeat ekg after replacement.

## 2019-07-26 NOTE — H&P (Addendum)
History and Physical    Dean Howard:[id number] DOB: [birth year] DOA: 07/26/2019  PCP: Patient, No Pcp Per   Patient coming from: Home    Chief Complaint: I wanted to die  HPI: Dean Howard is a 54 y.o. male with medical history significant of CAD, depression and previous suicidal attempt with overdose of aspirin in 2020, non-compliant with psy meds, presented with overdose of Tylenol and ibuprofen.  Last night he took half bottle on unknown number of 200 mg ibuprofen, and 10 pills of 325 mg Tylenol.  This morning he took another 20 p.o. of 200 mg ibuprofen, and started to feel sharp-like epigastric pain nonradiating, and presented to the ED. he denied any chest pain no short of breath, no dysuria, no headache no blurry vision, no significant bleeding, but does have some bruising all over his arms.. ED Course: Platelet count less than 5000.  Review of Systems: As per HPI otherwise 10 point review of systems negative.    Past Medical History:  Diagnosis Date  . Coronary artery disease   . MI, old   . Tick fever     Past Surgical History:  Procedure Laterality Date  . HERNIA REPAIR    . KNEE SURGERY       reports that he has been smoking cigars. He has never used smokeless tobacco. He reports current alcohol use. He reports current drug use. Drug: Marijuana.  No Known Allergies  History reviewed. No pertinent family history.   Prior to Admission medications   Medication Sig Start Date End Date Taking? Authorizing Provider  cariprazine 4.5 MG CAPS Take 1 capsule (4.5 mg total) by mouth daily. For depression Patient not taking: Reported on 06/20/2019 07/27/18   Lindell Spar I, NP  lithium carbonate (ESKALITH) 450 MG CR tablet Take 1 tablet (450 mg total) by mouth every 12 (twelve) hours. For mood stabilization Patient not taking: Reported on 07/26/2019 07/26/18   Lindell Spar I, NP  methocarbamol (ROBAXIN) 750 MG tablet Take 1 tablet (750 mg total) by mouth 3 (three)  times daily as needed (muscle spasm/pain). Patient not taking: Reported on 07/26/2019 06/20/19   Lajean Saver, MD  temazepam (RESTORIL) 30 MG capsule Take 1 capsule (30 mg total) by mouth at bedtime. For sleep Patient not taking: Reported on 07/26/2019 07/26/18   Lindell Spar I, NP  traZODone (DESYREL) 100 MG tablet Take 1 tablet (100 mg total) by mouth at bedtime as needed for sleep. Patient not taking: Reported on 07/26/2019 07/26/18   Lindell Spar I, NP    Physical Exam: Vitals:   07/26/19 1001 07/26/19 1130 07/26/19 1300  BP: (!) 144/85 (!) 154/95 (!) 157/94  Pulse: 67 67 64  Resp: 19 13 17   Temp: 98.4 F (36.9 C)    TempSrc: Oral    SpO2: 100% 100% 100%  Weight:  77.1 kg     Constitutional: NAD, calm, comfortable Vitals:   07/26/19 1001 07/26/19 1130 07/26/19 1300  BP: (!) 144/85 (!) 154/95 (!) 157/94  Pulse: 67 67 64  Resp: 19 13 17   Temp: 98.4 F (36.9 C)    TempSrc: Oral    SpO2: 100% 100% 100%  Weight:  77.1 kg    Eyes: PERRL, lids and conjunctivae normal ENMT: Mucous membranes are moist. Posterior pharynx clear of any exudate or lesions.Normal dentition.  Neck: normal, supple, no masses, no thyromegaly Respiratory: clear to auscultation bilaterally, no wheezing, no crackles. Normal respiratory effort. No accessory muscle use.  Cardiovascular: Regular rate  and rhythm, no murmurs / rubs / gallops. No extremity edema. 2+ pedal pulses. No carotid bruits.  Abdomen: no tenderness, no masses palpated. No hepatosplenomegaly. Bowel sounds positive.  Musculoskeletal: no clubbing / cyanosis. No joint deformity upper and lower extremities. Good ROM, no contractures. Normal muscle tone.  Skin: Scattered bruising on bilateral arms Neurologic: CN 2-12 grossly intact. Sensation intact, DTR normal. Strength 5/5 in all 4.  Psychiatric: Normal judgment and insight. Alert and oriented x 3. Normal mood.     Labs on Admission: I have personally reviewed following labs and imaging  studies  CBC: Recent Labs  Lab 07/26/19 1008 07/26/19 1204  WBC 5.4 4.9  NEUTROABS 4.5  --   HGB 13.0 12.7*  HCT 38.9* 37.8*  MCV 92.6 92.4  PLT <5* <5*   Basic Metabolic Panel: Recent Labs  Lab 07/26/19 1008  NA 137  K 3.8  CL 103  CO2 24  GLUCOSE 112*  BUN 15  CREATININE 1.48*  CALCIUM 9.2   GFR: Estimated Creatinine Clearance: 63.7 mL/min (A) (by C-G formula based on SCr of 1.48 mg/dL (H)). Liver Function Tests: Recent Labs  Lab 07/26/19 1008  AST 443*  ALT 201*  ALKPHOS 76  BILITOT 2.5*  PROT 7.0  ALBUMIN 3.6   No results for input(s): LIPASE, AMYLASE in the last 168 hours. No results for input(s): AMMONIA in the last 168 hours. Coagulation Profile: Recent Labs  Lab 07/26/19 1008  INR 1.4*   Cardiac Enzymes: No results for input(s): CKTOTAL, CKMB, CKMBINDEX, TROPONINI in the last 168 hours. BNP (last 3 results) No results for input(s): PROBNP in the last 8760 hours. HbA1C: No results for input(s): HGBA1C in the last 72 hours. CBG: No results for input(s): GLUCAP in the last 168 hours. Lipid Profile: No results for input(s): CHOL, HDL, LDLCALC, TRIG, CHOLHDL, LDLDIRECT in the last 72 hours. Thyroid Function Tests: No results for input(s): TSH, T4TOTAL, FREET4, T3FREE, THYROIDAB in the last 72 hours. Anemia Panel: No results for input(s): VITAMINB12, FOLATE, FERRITIN, TIBC, IRON, RETICCTPCT in the last 72 hours. Urine analysis:    Component Value Date/Time   COLORURINE YELLOW 06/20/2019 1210   APPEARANCEUR CLEAR 06/20/2019 1210   LABSPEC 1.020 06/20/2019 1210   PHURINE 5.0 06/20/2019 1210   GLUCOSEU NEGATIVE 06/20/2019 1210   HGBUR NEGATIVE 06/20/2019 1210   BILIRUBINUR NEGATIVE 06/20/2019 1210   KETONESUR 5 (A) 06/20/2019 1210   PROTEINUR NEGATIVE 06/20/2019 1210   UROBILINOGEN 1.0 09/02/2007 1900   NITRITE NEGATIVE 06/20/2019 1210   LEUKOCYTESUR NEGATIVE 06/20/2019 1210    Radiological Exams on Admission: DG Chest Port 1  View  Result Date: 07/26/2019 CLINICAL DATA:  Intentional drug overdose. EXAM: PORTABLE CHEST 1 VIEW COMPARISON:  June 20, 2019. FINDINGS: The heart size and mediastinal contours are within normal limits. Both lungs are clear. The visualized skeletal structures are unremarkable. IMPRESSION: No active disease. Electronically Signed   By: Lupita Raider M.D.   On: 07/26/2019 12:44    EKG: Independently reviewed.  Prolonged QTC, no QRS prolongation  Assessment/Plan Active Problems:   Overdose by acetaminophen   Tylenol overdose  Acetaminophen and ibuprofen overdose Poison Control Center contacted, recommend acetylcysteine drip which was started in the ED Continue high rate hydration No Bicarb drip indicated as QRS not prolonged  Thrombocytopenia acute Oncology on board, recommend platelet confusion for now. Differential can be ITP versus cirrhosis related etiology. Peripheral smear reviewed by oncology to rule out TTP/HUS. HIV pending  Suicidal attempt One-to-one for safety  Consult psychiatry for inpatient psychiatry evaluation. Repeat EKG every 6H, consider antipsychiatric medication.  Transaminitis Liver function checked with mild elevation of INR and normal albumin level Ammonia level normal CMP every 12 hours. Continue Mucomyst for now.  CKD stage I Creatinine level improved after hydration, continue IV fluid.  DVT prophylaxis: SCD Code Status: Full code Family Communication: Left mother message Disposition Plan: Inpatient psych indicated Consults called: Oncology, Psy Admission status: PCU   Emeline General MD [facility] Pager [phone]  If 7PM-7AM, please contact night-coverage [email or web address] Password [facility]  07/26/2019, 1:37 PM

## 2019-07-26 NOTE — ED Provider Notes (Addendum)
[facility] EMERGENCY DEPARTMENT Provider Note   CSN: [id number] Arrival date & time: 07/26/19  0935     History Chief Complaint  Patient presents with  . Drug Overdose  . Emesis    RAHMIR BEEVER is a 54 y.o. male.  HPI Patient presents after intentional overdose in an attempt to kill himself.  States that he took Motrin and Tylenol since around 4:00 yesterday.  States he took it because he was having abdominal pain and vomiting.  Also states he took it to kill himself.  Reports that he took 20,000 mg of ibuprofen and 3250 mg of Tylenol.  Has been vomiting for EMS.  Pain in his upper abdomen.  History of schizophrenia and bipolar.  States he has been off his psychiatric medicines because of Covid.    Past Medical History:  Diagnosis Date  . Coronary artery disease   . MI, old   . Tick fever     Patient Active Problem List   Diagnosis Date Noted  . Schizo-affective schizophrenia, chronic condition with acute exacerbation (HCC) 07/21/2018  . Bipolar 1 disorder, mixed, moderate (HCC)   . Adjustment disorder 07/20/2016  . MDD (major depressive disorder), recurrent, severe, with psychosis (HCC) 07/18/2016  . 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 CHEST PAIN, PLEURITIC 09/16/2007    Past Surgical History:  Procedure Laterality Date  . HERNIA REPAIR    . KNEE SURGERY         History reviewed. No pertinent family history.  Social History   Tobacco Use  . Smoking status: Current Every Day Smoker    Types: Cigars    Last attempt to quit: 03/04/2016    Years since quitting: 3.3  . Smokeless tobacco: Never Used  Substance Use Topics  . Alcohol use: Yes    Comment: Daily   . Drug use: Yes    Types: Marijuana    Comment: Daily     Home Medications Prior to Admission medications   Medication Sig Start Date End Date Taking? Authorizing Provider   cariprazine 4.5 MG CAPS Take 1 capsule (4.5 mg total) by mouth daily. For depression Patient not taking: Reported on 06/20/2019 07/27/18   Armandina Stammer I, NP  lithium carbonate (ESKALITH) 450 MG CR tablet Take 1 tablet (450 mg total) by mouth every 12 (twelve) hours. For mood stabilization Patient not taking: Reported on 07/26/2019 07/26/18   Armandina Stammer I, NP  methocarbamol (ROBAXIN) 750 MG tablet Take 1 tablet (750 mg total) by mouth 3 (three) times daily as needed (muscle spasm/pain). Patient not taking: Reported on 07/26/2019 06/20/19   Cathren Laine, MD  temazepam (RESTORIL) 30 MG capsule Take 1 capsule (30 mg total) by mouth at bedtime. For sleep Patient not taking: Reported on 07/26/2019 07/26/18   Armandina Stammer I, NP  traZODone (DESYREL) 100 MG tablet Take 1 tablet (100 mg total) by mouth at bedtime as needed for sleep. Patient not taking: Reported on 07/26/2019 07/26/18   Armandina Stammer I, NP    Allergies    Patient has no known allergies.  Review of Systems   Review of Systems  Constitutional: Negative for appetite change.  HENT: Negative for congestion.   Respiratory: Negative for shortness of breath.   Cardiovascular: Negative for chest pain.  Gastrointestinal: Positive for abdominal pain, nausea and vomiting. Negative for constipation.  Genitourinary: Negative for flank pain.  Musculoskeletal: Negative for  back pain.  Skin: Negative for rash.  Neurological: Negative for seizures and weakness.  Hematological: Bruises/bleeds easily.  Psychiatric/Behavioral: Positive for suicidal ideas.    Physical Exam Updated Vital Signs BP (!) 154/95   Pulse 67   Temp 98.4 F (36.9 C) (Oral)   Resp 13   Wt 77.1 kg   SpO2 100%   BMI 21.25 kg/m   Physical Exam Vitals and nursing note reviewed.  HENT:     Head: Atraumatic.     Mouth/Throat:     Comments: Some petechiae and blood blisters in posterior pharynx Eyes:     Pupils: Pupils are equal, round, and reactive to light.  Cardiovascular:      Rate and Rhythm: Normal rate.  Pulmonary:     Breath sounds: No wheezing, rhonchi or rales.  Abdominal:     Comments: Epigastric tenderness without rebound or guarding  Musculoskeletal:     Cervical back: Neck supple.     Right lower leg: No edema.     Left lower leg: No edema.  Skin:    General: Skin is warm.     Capillary Refill: Capillary refill takes less than 2 seconds.     Findings: Bruising present.     Comments: Does have some bruises and areas of petechiae.  Neurological:     Mental Status: Mental status is at baseline.  Psychiatric:        Behavior: Behavior normal.     ED Results / Procedures / Treatments   Labs (all labs ordered are listed, but only abnormal results are displayed) Labs Reviewed  COMPREHENSIVE METABOLIC PANEL - Abnormal; Notable for the following components:      Result Value   Glucose, Bld 112 (*)    Creatinine, Ser 1.48 (*)    AST 443 (*)    ALT 201 (*)    Total Bilirubin 2.5 (*)    GFR calc non Af Amer 54 (*)    All other components within normal limits  PROTIME-INR - Abnormal; Notable for the following components:   Prothrombin Time 17.5 (*)    INR 1.4 (*)    All other components within normal limits  CBC WITH DIFFERENTIAL/PLATELET - Abnormal; Notable for the following components:   RBC 4.20 (*)    HCT 38.9 (*)    RDW 16.6 (*)    Platelets <5 (*)    Lymphs Abs 0.6 (*)    All other components within normal limits  SALICYLATE LEVEL - Abnormal; Notable for the following components:   Salicylate Lvl <7.0 (*)    All other components within normal limits  LITHIUM LEVEL - Abnormal; Notable for the following components:   Lithium Lvl <0.06 (*)    All other components within normal limits  RESPIRATORY PANEL BY RT PCR (FLU A&B, COVID)  ACETAMINOPHEN LEVEL  ETHANOL  RAPID URINE DRUG SCREEN, HOSP PERFORMED  CBC  MAGNESIUM  TYPE AND SCREEN  PREPARE PLATELET PHERESIS    EKG EKG Interpretation  Date/Time:  Tuesday July 26 2019  09:59:53 EST Ventricular Rate:  76 PR Interval:    QRS Duration: 96 QT Interval:  453 QTC Calculation: 510 R Axis:   -56 Text Interpretation: Sinus rhythm Left anterior fascicular block Probable anteroseptal infarct, old Nonspecific T abnormalities, inferior leads Prolonged QT interval Confirmed by Benjiman Core [phone]) on 07/26/2019 10:26:18 AM   Radiology No results found.  Procedures Procedures (including critical care time)  Medications Ordered in ED Medications  acetylcysteine (ACETADOTE) 40 mg/mL load via  infusion 11,565 mg (has no administration in time range)    Followed by  acetylcysteine (ACETADOTE) 24,000 mg in dextrose 5 % 600 mL (40 mg/mL) infusion (has no administration in time range)  0.9 %  sodium chloride infusion (has no administration in time range)  sodium chloride 0.9 % bolus 1,000 mL (1,000 mLs Intravenous New Bag/Given 07/26/19 1111)    ED Course  I have reviewed the triage vital signs and the nursing notes.  Pertinent labs & imaging results that were available during my care of the patient were reviewed by me and considered in my medical decision making (see chart for details).    MDM Rules/Calculators/A&P                      Patient presented after overdose on Tylenol and Motrin.  Lab work shows elevated LFTs.  Unclear if this is from the Tylenol overdose or from his heavy alcohol use.  Tylenol dose is 19 but does not fit in the nomogram since he states he been taking it over extended amount time.  Will need N-acetylcysteine however.  Also found to have thrombocytopenia.  Initial some question whether it was accurate, however does have petechiae and some extra bruising I think it is likely real.  Not hypotensive at this point.  Will consult oncology.  Will admit to unassigned medicine.  Patient also with suicide attempt  Discussed with Dr. Alvy Bimler.  She feels it may be more likely due to liver issues as opposed to ITP.  Will transfuse 1 unit of platelets.   They will officially consult.  CRITICAL CARE Performed by: Davonna Belling Total critical care time: 30 minutes Critical care time was exclusive of separately billable procedures and treating other patients. Critical care was necessary to treat or prevent imminent or life-threatening deterioration. Critical care was time spent personally by me on the following activities: development of treatment plan with patient and/or surrogate as well as nursing, discussions with consultants, evaluation of patient's response to treatment, examination of patient, obtaining history from patient or surrogate, ordering and performing treatments and interventions, ordering and review of laboratory studies, ordering and review of radiographic studies, pulse oximetry and re-evaluation of patient's condition.  . Final Clinical Impression(s) / ED Diagnoses Final diagnoses:  Thrombocytopenia ([place])  Intentional drug overdose, initial encounter ([place])  Acetaminophen toxicity, intentional self-harm, initial encounter [facility])  Suicide attempt [facility])    Rx / DC Orders ED Discharge Orders    None       Davonna Belling, MD 07/26/19 1153    Davonna Belling, MD 07/26/19 [phone]

## 2019-07-26 NOTE — ED Notes (Signed)
Iv team at bedside  

## 2019-07-26 NOTE — Plan of Care (Signed)
  Problem: Education: Goal: Knowledge of General Education information will improve Description: Including pain rating scale, medication(s)/side effects and non-pharmacologic comfort measures Outcome: Progressing   Problem: Health Behavior/Discharge Planning: Goal: Ability to manage health-related needs will improve Outcome: Progressing   Problem: Clinical Measurements: Goal: Diagnostic test results will improve Outcome: Progressing   Problem: Activity: Goal: Risk for activity intolerance will decrease Outcome: Progressing   Problem: Coping: Goal: Level of anxiety will decrease Outcome: Progressing   Problem: Safety: Goal: Ability to remain free from injury will improve Outcome: Progressing   

## 2019-07-26 NOTE — ED Notes (Signed)
Sitter ordered for patient.

## 2019-07-26 NOTE — Progress Notes (Signed)
Per Poison Control's recommendations, they recommend to keep patients Magnesium and Potassium levels on the higher side of normal due to the prolonged QT Interval the patient had in the ED along with repeat EKGs. RN paged M. Katherina Right for orders of K+ and Mg replacement along with an EKG. Orders for 3 runs of K+ IV were ordered and started as well as an EKG performed and results placed on chart. Poison Control also recommended to stay away from Geodon, Haldol, and Zofran due to the prolonged QT interval. Pt is tolerating treatment well and will re-page for Mg replacement orders once K+ orders have been completed.

## 2019-07-26 NOTE — ED Notes (Signed)
Notified attending Dr. Chipper Herb pt has only 1 IV at present. Unable to start platelets. Pt has been stuck many times. ED PA attempting ultrasound guided IV placement. IV team consulted as well. Recommended PICC or central line placement. NNO at this time.

## 2019-07-26 NOTE — ED Triage Notes (Signed)
Per EMS: Pt from home with c/o of intentional drug overdose.  Pt endorses taking 20,000mg  Ibuprofen and 3250 mg of Tylenol in attempt to commit suicide. Pt was vomiting upon EMS arrival.  Pt also notes epigastric pain and vomiting x2 days prior to the drug consumption.  EMS administered of NS PTA.   EMS vitals: BP 118/70 HR 78 RR 16 CBG 181 Sp02 99% RA EKG shows prolonged QT   Poison control notified and will follow up.

## 2019-07-27 DIAGNOSIS — R7989 Other specified abnormal findings of blood chemistry: Secondary | ICD-10-CM

## 2019-07-27 LAB — BPAM PLATELET PHERESIS
Blood Product Expiration Date: 202102022359
ISSUE DATE / TIME: 202102021316
Unit Type and Rh: 9500

## 2019-07-27 LAB — CBC WITH DIFFERENTIAL/PLATELET
Abs Immature Granulocytes: 0.01 10*3/uL (ref 0.00–0.07)
Abs Immature Granulocytes: 0.01 10*3/uL (ref 0.00–0.07)
Basophils Absolute: 0 10*3/uL (ref 0.0–0.1)
Basophils Absolute: 0 10*3/uL (ref 0.0–0.1)
Basophils Relative: 1 %
Basophils Relative: 0 %
Eosinophils Absolute: 0.1 10*3/uL (ref 0.0–0.5)
Eosinophils Absolute: 0.1 10*3/uL (ref 0.0–0.5)
Eosinophils Relative: 5 %
Eosinophils Relative: 3 %
HCT: 33.5 % — ABNORMAL LOW (ref 39.0–52.0)
HCT: 33.4 % — ABNORMAL LOW (ref 39.0–52.0)
Hemoglobin: 11.5 g/dL — ABNORMAL LOW (ref 13.0–17.0)
Hemoglobin: 11.6 g/dL — ABNORMAL LOW (ref 13.0–17.0)
Immature Granulocytes: 0 %
Immature Granulocytes: 0 %
Lymphocytes Relative: 28 %
Lymphocytes Relative: 25 %
Lymphs Abs: 0.8 10*3/uL (ref 0.7–4.0)
Lymphs Abs: 0.7 10*3/uL (ref 0.7–4.0)
MCH: 31 pg (ref 26.0–34.0)
MCH: 30.9 pg (ref 26.0–34.0)
MCHC: 34.3 g/dL (ref 30.0–36.0)
MCHC: 34.7 g/dL (ref 30.0–36.0)
MCV: 90.3 fL (ref 80.0–100.0)
MCV: 88.8 fL (ref 80.0–100.0)
Monocytes Absolute: 0.2 10*3/uL (ref 0.1–1.0)
Monocytes Absolute: 0.1 10*3/uL (ref 0.1–1.0)
Monocytes Relative: 7 %
Monocytes Relative: 4 %
Neutro Abs: 1.7 10*3/uL (ref 1.7–7.7)
Neutro Abs: 1.9 10*3/uL (ref 1.7–7.7)
Neutrophils Relative %: 59 %
Neutrophils Relative %: 68 %
Platelets: <5 10*3/uL — CL (ref 150–400)
Platelets: 6 10*3/uL — CL (ref 150–400)
RBC: 3.71 MIL/uL — ABNORMAL LOW (ref 4.22–5.81)
RBC: 3.76 MIL/uL — ABNORMAL LOW (ref 4.22–5.81)
RDW: 15.9 % — ABNORMAL HIGH (ref 11.5–15.5)
RDW: 15.5 % (ref 11.5–15.5)
WBC: 2.9 10*3/uL — ABNORMAL LOW (ref 4.0–10.5)
WBC: 2.8 10*3/uL — ABNORMAL LOW (ref 4.0–10.5)
nRBC: 0 % (ref 0.0–0.2)
nRBC: 0 % (ref 0.0–0.2)

## 2019-07-27 LAB — COMPREHENSIVE METABOLIC PANEL
ALT: 751 U/L — ABNORMAL HIGH (ref 0–44)
ALT: 1692 U/L — ABNORMAL HIGH (ref 0–44)
AST: 1316 U/L — ABNORMAL HIGH (ref 15–41)
AST: 2582 U/L — ABNORMAL HIGH (ref 15–41)
Albumin: 2.7 g/dL — ABNORMAL LOW (ref 3.5–5.0)
Albumin: 2.6 g/dL — ABNORMAL LOW (ref 3.5–5.0)
Alkaline Phosphatase: 52 U/L (ref 38–126)
Alkaline Phosphatase: 56 U/L (ref 38–126)
Anion gap: 11 (ref 5–15)
Anion gap: 10 (ref 5–15)
BUN: 9 mg/dL (ref 6–20)
BUN: 7 mg/dL (ref 6–20)
CO2: 19 mmol/L — ABNORMAL LOW (ref 22–32)
CO2: 19 mmol/L — ABNORMAL LOW (ref 22–32)
Calcium: 8 mg/dL — ABNORMAL LOW (ref 8.9–10.3)
Calcium: 8.3 mg/dL — ABNORMAL LOW (ref 8.9–10.3)
Chloride: 107 mmol/L (ref 98–111)
Chloride: 106 mmol/L (ref 98–111)
Creatinine, Ser: 1.27 mg/dL — ABNORMAL HIGH (ref 0.61–1.24)
Creatinine, Ser: 1.21 mg/dL (ref 0.61–1.24)
GFR calc Af Amer: >60 mL/min (ref >60)
GFR calc Af Amer: >60 mL/min (ref >60)
GFR calc non Af Amer: >60 mL/min (ref >60)
GFR calc non Af Amer: >60 mL/min (ref >60)
Glucose, Bld: 77 mg/dL (ref 70–99)
Glucose, Bld: 121 mg/dL — ABNORMAL HIGH (ref 70–99)
Potassium: 3.5 mmol/L (ref 3.5–5.1)
Potassium: 3.5 mmol/L (ref 3.5–5.1)
Sodium: 137 mmol/L (ref 135–145)
Sodium: 135 mmol/L (ref 135–145)
Total Bilirubin: 2.7 mg/dL — ABNORMAL HIGH (ref 0.3–1.2)
Total Bilirubin: 2.6 mg/dL — ABNORMAL HIGH (ref 0.3–1.2)
Total Protein: 5.4 g/dL — ABNORMAL LOW (ref 6.5–8.1)
Total Protein: 5.1 g/dL — ABNORMAL LOW (ref 6.5–8.1)

## 2019-07-27 LAB — HEPATITIS PANEL, ACUTE
HCV Ab: NONREACTIVE
Hep A IgM: NONREACTIVE
Hep B C IgM: NONREACTIVE
Hepatitis B Surface Ag: NONREACTIVE

## 2019-07-27 LAB — PREPARE PLATELET PHERESIS: Unit division: 0

## 2019-07-27 LAB — ACETAMINOPHEN LEVEL: Acetaminophen (Tylenol), Serum: <10 ug/mL — ABNORMAL LOW (ref 10–30)

## 2019-07-27 LAB — PROTIME-INR
INR: 2.1 — ABNORMAL HIGH (ref 0.8–1.2)
INR: 2.7 — ABNORMAL HIGH (ref 0.8–1.2)
Prothrombin Time: 23.6 seconds — ABNORMAL HIGH (ref 11.4–15.2)
Prothrombin Time: 28.3 seconds — ABNORMAL HIGH (ref 11.4–15.2)

## 2019-07-27 LAB — PATHOLOGIST SMEAR REVIEW

## 2019-07-27 LAB — VITAMIN B12: Vitamin B-12: 853 pg/mL (ref 180–914)

## 2019-07-27 MED ORDER — POTASSIUM CHLORIDE 10 MEQ/100ML IV SOLN
10.0000 meq | INTRAVENOUS | Status: AC
  Administered 2019-07-27 (×2): 10 meq via INTRAVENOUS
  Filled 2019-07-27 (×2): qty 100

## 2019-07-27 MED ORDER — SODIUM CHLORIDE 0.9% IV SOLUTION: Freq: Once | INTRAVENOUS | Status: AC

## 2019-07-27 MED ORDER — SCOPOLAMINE 1 MG/3DAYS TD PT72
1.0000 | MEDICATED_PATCH | TRANSDERMAL | Status: DC
  Administered 2019-07-27 – 2019-08-02 (×3): 1.5 mg via TRANSDERMAL
  Filled 2019-07-27 (×3): qty 1

## 2019-07-27 NOTE — Progress Notes (Addendum)
PROGRESS NOTE    Dean Howard  EHU:[id number] DOB: [birth year] DOA: 07/26/2019 PCP: Patient, No Pcp Per   Brief Narrative: 54 year old male admitted with intentional overdose.  He has significant history of depression, schizophrenia, bipolar disorder, MI in 2014 previous suicidal attempts and CAD.  He overdosed in 2020 on aspirin.  This time he presented with overdose of Tylenol and ibuprofen.  He took unknown number of 200 mg ibuprofen and 10 pills of 325 mg of Tylenol.  Prior to coming into the hospital he took another 20 tablets of 200 mg of ibuprofen and started to have sharp abdominal pain. Patient reports feeling depressed due to lack of health insurance and lack of getting his disability hearing postponed to April 2021. He drinks 18-24 beers per day and continues to smoke marijuana daily for the last 4 years. He lives at home with his sister and his mother. He was found to have severe thrombocytopenia.  Lately count less than 5000.  Assessment & Plan:   Principal Problem:   Overdose by acetaminophen Active Problems:   Tylenol overdose   Overdose   #1 intentional overdose on acetaminophen and ibuprofen in an intent to kill himself from severe depression. Patient complains of abdominal pain nausea and vomiting. Continue IV fluids, scopolamine patch for nausea Poison control recommends keeping his electrolytes K and mag above 4 and 2. Status post acetylcysteine drip. Check mag today and daily Urine drug screen positive for THC alcohol level less than 10 salicylate level less than 7 EKG today and daily QTC 510  #2 severe acute thrombocytopenia-question likely secondary to alcohol use and cirrhosis  appreciate input from hematology.  Platelet count less than 5000 down from 9 yesterday. Patient to get platelet transfusion today. HIV negative.  #3 multiple suicidal attempts psych consult appreciated.  They recommend inpatient psychiatric admission when he is medically  stable.  #4 CKD stage I creatinine 1.27 continue IV fluids and monitor  #5 acute liver toxicity secondary to #1- AST and ALT trending up. AST 1316 from 619 ALT 751 from 328 Total bilirubin 2.7 Albumin 2.7 INR 2.1 Hopefully this will recover though LFTs are trending in the wrong direction  #6 alcohol abuse watch for withdrawals-    Estimated body mass index is 19.21 kg/m as calculated from the following:   Height as of this encounter: 6\' 3"  (1.905 m).   Weight as of this encounter: 69.7 kg.  DVT prophylaxis: SCD Code Status: Full code Family Communication: d/w mother and sister  Disposition Plan: Once medically stable he will be discharged to inpatient psych.  Patient came from home.  Barriers to discharge is patient with liver toxicity from intentional overdose on IV N-acetylcysteine symptomatic treatments close monitoring of electrolytes and EKG for QT prolongation.   Consultants: Psychiatry   Procedures: None Antimicrobials: None  Subjective: He is resting in bed complaining of abdominal pain staff reported that he vomited after I saw him   Objective: Vitals:   07/27/19 0009 07/27/19 0414 07/27/19 1000 07/27/19 1017  BP:    (!) 139/53  Pulse:   64 (!) 58  Resp:   14 15  Temp: 97.7 F (36.5 C) 98 F (36.7 C) 98 F (36.7 C) 98 F (36.7 C)  TempSrc: Axillary Oral Oral Oral  SpO2:   100% 100%  Weight:      Height:        Intake/Output Summary (Last 24 hours) at 07/27/2019 1025 Last data filed at 07/27/2019 1019 Gross per 24 hour  Intake 1728 ml  Output 1102 ml  Net 626 ml   Filed Weights   07/26/19 1130 07/26/19 2140  Weight: 77.1 kg 69.7 kg    Examination:  General exam: Appears calm and comfortable  Respiratory system: Clear to auscultation. Respiratory effort normal. Cardiovascular system: S1 & S2 heard, RRR. No JVD, murmurs, rubs, gallops or clicks. No pedal edema. Gastrointestinal system: Abdomen is nondistended, soft and epigastric tender. No  organomegaly or masses felt. Normal bowel sounds heard. Central nervous system: Alert and oriented. No focal neurological deficits. Extremities: Symmetric 5 x 5 power. Skin: No rashes, lesions or ulcers Psychiatry: Judgement and insight appear normal. Mood & affect appropriate.     Data Reviewed: I have personally reviewed following labs and imaging studies  CBC: Recent Labs  Lab 07/26/19 1008 07/26/19 1204 07/26/19 2009 07/27/19 0637  WBC 5.4 4.9 5.8 2.9*  NEUTROABS 4.5  --  4.5 1.7  HGB 13.0 12.7* 12.6* 11.5*  HCT 38.9* 37.8* 37.4* 33.5*  MCV 92.6 92.4 91.9 90.3  PLT <5* <5* 9* <5*   Basic Metabolic Panel: Recent Labs  Lab 07/26/19 1008 07/26/19 1500 07/26/19 1730 07/26/19 2009 07/27/19 0637  NA 137 134*  --  138 137  K 3.8 3.7  --  3.7 3.5  CL 103 103  --  106 107  CO2 24 21*  --  21* 19*  GLUCOSE 112* 129*  --  102* 77  BUN 15 12  --  11 9  CREATININE 1.48* 1.23  --  1.32* 1.27*  CALCIUM 9.2 8.2*  --  8.4* 8.0*  MG 2.1  --  2.2  --   --    GFR: Estimated Creatinine Clearance: 67.1 mL/min (A) (by C-G formula based on SCr of 1.27 mg/dL (H)). Liver Function Tests: Recent Labs  Lab 07/26/19 1008 07/26/19 1500 07/26/19 2009 07/27/19 0637  AST 443* 421* 619* 1,316*  ALT 201* 239* 328* 751*  ALKPHOS 76 54 61 52  BILITOT 2.5* 2.2* 2.4* 2.7*  PROT 7.0 6.2* 6.0* 5.4*  ALBUMIN 3.6 3.3* 3.0* 2.7*   No results for input(s): LIPASE, AMYLASE in the last 168 hours. Recent Labs  Lab 07/26/19 1500  AMMONIA 16   Coagulation Profile: Recent Labs  Lab 07/26/19 1008 07/26/19 2009 07/27/19 0637  INR 1.4* 1.9* 2.1*   Cardiac Enzymes: Recent Labs  Lab 07/26/19 1730  CKTOTAL 125   BNP (last 3 results) No results for input(s): PROBNP in the last 8760 hours. HbA1C: No results for input(s): HGBA1C in the last 72 hours. CBG: No results for input(s): GLUCAP in the last 168 hours. Lipid Profile: No results for input(s): CHOL, HDL, LDLCALC, TRIG, CHOLHDL,  LDLDIRECT in the last 72 hours. Thyroid Function Tests: No results for input(s): TSH, T4TOTAL, FREET4, T3FREE, THYROIDAB in the last 72 hours. Anemia Panel: Recent Labs    07/27/19 0637  VITAMINB12 853   Sepsis Labs: Recent Labs  Lab 07/26/19 1600 07/26/19 2009  LATICACIDVEN 0.8 1.5    Recent Results (from the past 240 hour(s))  Respiratory Panel by RT PCR (Flu A&B, Covid) - Nasopharyngeal Swab     Status: None   Collection Time: 07/26/19 12:00 PM   Specimen: Nasopharyngeal Swab  Result Value Ref Range Status   SARS Coronavirus 2 by RT PCR NEGATIVE NEGATIVE Final    Comment: (NOTE) SARS-CoV-2 target nucleic acids are NOT DETECTED. The SARS-CoV-2 RNA is generally detectable in upper respiratoy specimens during the acute phase of infection. The lowest concentration of  SARS-CoV-2 viral copies this assay can detect is 131 copies/mL. A negative result does not preclude SARS-Cov-2 infection and should not be used as the sole basis for treatment or other patient management decisions. A negative result may occur with  improper specimen collection/handling, submission of specimen other than nasopharyngeal swab, presence of viral mutation(s) within the areas targeted by this assay, and inadequate number of viral copies (<131 copies/mL). A negative result must be combined with clinical observations, patient history, and epidemiological information. The expected result is Negative. Fact Sheet for Patients:  PinkCheek.be Fact Sheet for Healthcare Providers:  GravelBags.it This test is not yet ap proved or cleared by the [place] FDA and  has been authorized for detection and/or diagnosis of SARS-CoV-2 by FDA under an Emergency Use Authorization (EUA). This EUA will remain  in effect (meaning this test can be used) for the duration of the COVID-19 declaration under Section 564(b)(1) of the Act, 21 U.S.C. section  360bbb-3(b)(1), unless the authorization is terminated or revoked sooner.    Influenza A by PCR NEGATIVE NEGATIVE Final   Influenza B by PCR NEGATIVE NEGATIVE Final    Comment: (NOTE) The Xpert Xpress SARS-CoV-2/FLU/RSV assay is intended as an aid in  the diagnosis of influenza from Nasopharyngeal swab specimens and  should not be used as a sole basis for treatment. Nasal washings and  aspirates are unacceptable for Xpert Xpress SARS-CoV-2/FLU/RSV  testing. Fact Sheet for Patients: PinkCheek.be Fact Sheet for Healthcare Providers: GravelBags.it This test is not yet approved or cleared by the [place] FDA and  has been authorized for detection and/or diagnosis of SARS-CoV-2 by  FDA under an Emergency Use Authorization (EUA). This EUA will remain  in effect (meaning this test can be used) for the duration of the  Covid-19 declaration under Section 564(b)(1) of the Act, 21  U.S.C. section 360bbb-3(b)(1), unless the authorization is  terminated or revoked. Performed at [facility], [place] [address], [place], [place] [address]   MRSA PCR Screening     Status: None   Collection Time: 07/26/19  9:28 PM   Specimen: Nasopharyngeal  Result Value Ref Range Status   MRSA by PCR NEGATIVE NEGATIVE Final    Comment:        The GeneXpert MRSA Assay (FDA approved for NASAL specimens only), is one component of a comprehensive MRSA colonization surveillance program. It is not intended to diagnose MRSA infection nor to guide or monitor treatment for MRSA infections. Performed at [facility], [place] [address], [place], [place] [address]          Radiology Studies: DG Chest Port 1 View  Result Date: 07/26/2019 CLINICAL DATA:  Intentional drug overdose. EXAM: PORTABLE CHEST 1 VIEW COMPARISON:  June 20, 2019. FINDINGS: The heart size and mediastinal contours are within normal limits. Both lungs are clear. The  visualized skeletal structures are unremarkable. IMPRESSION: No active disease. Electronically Signed   By: Marijo Conception M.D.   On: 07/26/2019 12:44   US Abdomen Limited RUQ  Result Date: 07/26/2019 CLINICAL DATA:  LFT elevation EXAM: ULTRASOUND ABDOMEN LIMITED RIGHT UPPER QUADRANT COMPARISON:  None. FINDINGS: Gallbladder: No gallstones or wall thickening visualized. No sonographic Murphy sign noted by sonographer. Common bile duct: Diameter: 5 mm Liver: Increased echotexture seen throughout. No focal abnormality or biliary ductal dilatation. Portal vein is patent on color Doppler imaging with normal direction of blood flow towards the liver. Other: None. IMPRESSION: Mild hepatic steatosis.  Normal appearing gallbladder.  Electronically Signed   By: Jonna Clark M.D.   On: 07/26/2019 19:02        Scheduled Meds: . sodium chloride   Intravenous Once  . sodium chloride   Intravenous Once  . sodium chloride   Intravenous Once  . ondansetron (ZOFRAN) IV  4 mg Intravenous Once  . pantoprazole (PROTONIX) IV  40 mg Intravenous Q12H  . scopolamine  1 patch Transdermal Q72H   Continuous Infusions: . sodium chloride 150 mL/hr at 07/26/19 1322  . acetylcysteine 15 mg/kg/hr (07/27/19 0438)     LOS: 1 day      Alwyn Ren, MD [facility]  If 7PM-7AM, please contact night-coverage [email or web address] Password [facility] 07/27/2019, 10:25 AM

## 2019-07-27 NOTE — Progress Notes (Signed)
Patient not seen Discussed with primary service about his persistent low platelets and worsening liver function I recommend continue transfusion support only I will order 1 unit of platelets Will follow tomorrow

## 2019-07-27 NOTE — Consult Note (Signed)
Telepsych Consultation   Reason for Consult:  "Suicide attempt" Referring Physician:  Dr Jerolyn Center Location of Patient: [facility] Location of Provider: [facility]  Patient Identification: Dean Howard MRN:  [id number] Principal Diagnosis: Overdose by acetaminophen Diagnosis:  Principal Problem:   Overdose by acetaminophen Active Problems:   Tylenol overdose   Overdose   Total Time spent with patient: 30 minutes  Subjective:   Dean Howard is a 54 y.o. male patient admitted with intentional overdose.  Patient assessed by nurse practitioner.  Patient alert and oriented, answers appropriately.  Patient states "I had not been able to get my meds for 4 months and I am constantly throwing up and dizzy also Covid pushed back my disability hearing from last year to this 10/28/22." Patient reports intentional overdose on aspirin and a suicide attempt.  Patient currently endorses suicidal ideations without plan or intent.  Patient denies homicidal ideations.  Patient reports auditory hallucinations states, "I hear people coming after me."  Patient denies visual hallucinations. Patient seen outpatient by Dr. Sheppard Penton at [facility].  Patient reports did not attend January 18 appointment as he was "having trouble with my phone."  Patient reports neck scheduled appointment in March 2021. Patient reports he lives with his mother and sister.  Patient denies access to weapons.  Patient reports substance use including 12-18 beers per day x4 years and 4 marijuana cigarettes per day x4 years.  Patient reports alcohol and marijuana use since father's death in 28-Oct-2015. Case discussed with Dr. Jama Flavors.  HPI:  Patient admitted with intentional overdose.   Past Psychiatric History: MDD, Bipolar 1 disorder, schizoaffective disorder  Risk to Self:  Yes Risk to Others:  Denies Prior Inpatient Therapy:  Yes Prior Outpatient Therapy:  Yes  Past Medical History:  Past Medical History:   Diagnosis Date  . Coronary artery disease   . MI, old   . Tick fever     Past Surgical History:  Procedure Laterality Date  . HERNIA REPAIR    . KNEE SURGERY     Family History: History reviewed. No pertinent family history. Family Psychiatric  History: Brother-bipolar disorder, sister- bipolar disorder Social History:  Social History   Substance and Sexual Activity  Alcohol Use Yes   Comment: Daily      Social History   Substance and Sexual Activity  Drug Use Yes  . Types: Marijuana   Comment: Daily     Social History   Socioeconomic History  . Marital status: Single    Spouse name: Not on file  . Number of children: Not on file  . Years of education: Not on file  . Highest education level: Not on file  Occupational History  . Not on file  Tobacco Use  . Smoking status: Current Every Day Smoker    Types: Cigars    Last attempt to quit: 03/04/2016    Years since quitting: 3.3  . Smokeless tobacco: Never Used  Substance and Sexual Activity  . Alcohol use: Yes    Comment: Daily   . Drug use: Yes    Types: Marijuana    Comment: Daily   . Sexual activity: Yes  Other Topics Concern  . Not on file  Social History Narrative  . Not on file   Social Determinants of Health   Financial Resource Strain:   . Difficulty of Paying Living Expenses: Not on file  Food Insecurity:   . Worried About Programme researcher, broadcasting/film/video in the Last Year:  Not on file  . Ran Out of Food in the Last Year: Not on file  Transportation Needs:   . Lack of Transportation (Medical): Not on file  . Lack of Transportation (Non-Medical): Not on file  Physical Activity:   . Days of Exercise per Week: Not on file  . Minutes of Exercise per Session: Not on file  Stress:   . Feeling of Stress : Not on file  Social Connections:   . Frequency of Communication with Friends and Family: Not on file  . Frequency of Social Gatherings with Friends and Family: Not on file  . Attends Religious Services: Not  on file  . Active Member of Clubs or Organizations: Not on file  . Attends Banker Meetings: Not on file  . Marital Status: Not on file   Additional Social History:    Allergies:  No Known Allergies  Labs:  Results for orders placed or performed during the hospital encounter of 07/26/19 (from the past 48 hour(s))  Comprehensive metabolic panel     Status: Abnormal   Collection Time: 07/26/19 10:08 AM  Result Value Ref Range   Sodium 137 135 - 145 mmol/L   Potassium 3.8 3.5 - 5.1 mmol/L   Chloride 103 98 - 111 mmol/L   CO2 24 22 - 32 mmol/L   Glucose, Bld 112 (H) 70 - 99 mg/dL   BUN 15 6 - 20 mg/dL   Creatinine, Ser 1.88 (H) 0.61 - 1.24 mg/dL   Calcium 9.2 8.9 - 67.7 mg/dL   Total Protein 7.0 6.5 - 8.1 g/dL   Albumin 3.6 3.5 - 5.0 g/dL   AST 373 (H) 15 - 41 U/L   ALT 201 (H) 0 - 44 U/L   Alkaline Phosphatase 76 38 - 126 U/L   Total Bilirubin 2.5 (H) 0.3 - 1.2 mg/dL   GFR calc non Af Amer 54 (L) >60 mL/min   GFR calc Af Amer >60 >60 mL/min   Anion gap 10 5 - 15    Comment: Performed at [facility] Lab, [address] [place], [place] [address]  Protime-INR     Status: Abnormal   Collection Time: 07/26/19 10:08 AM  Result Value Ref Range   Prothrombin Time 17.5 (H) 11.4 - 15.2 seconds   INR 1.4 (H) 0.8 - 1.2    Comment: (NOTE) INR goal varies based on device and disease states. Performed at [facility] Lab, [address], [place], [place] [address]   CBC with Differential     Status: Abnormal   Collection Time: 07/26/19 10:08 AM  Result Value Ref Range   WBC 5.4 4.0 - 10.5 K/uL   RBC 4.20 (L) 4.22 - 5.81 MIL/uL   Hemoglobin 13.0 13.0 - 17.0 g/dL   HCT 61.5 (L) 18.3 - 43.7 %   MCV 92.6 80.0 - 100.0 fL   MCH 31.0 26.0 - 34.0 pg   MCHC 33.4 30.0 - 36.0 g/dL   RDW 35.7 (H) 89.7 - 84.7 %   Platelets <5 (LL) 150 - 400 K/uL    Comment: REPEATED TO VERIFY PLATELET COUNT CONFIRMED BY SMEAR Immature Platelet Fraction may be clinically indicated,  consider ordering this additional test [id number] CRITICAL RESULT CALLED TO, READ BACK BY AND VERIFIED WITH: Guerry Bruin, RN 1140 07/26/2019 BY MACEDA,J.    nRBC 0.0 0.0 - 0.2 %   Neutrophils Relative % 84 %   Neutro Abs 4.5 1.7 - 7.7 K/uL   Lymphocytes Relative 11 %  Lymphs Abs 0.6 (L) 0.7 - 4.0 K/uL   Monocytes Relative 4 %   Monocytes Absolute 0.2 0.1 - 1.0 K/uL   Eosinophils Relative 0 %   Eosinophils Absolute 0.0 0.0 - 0.5 K/uL   Basophils Relative 1 %   Basophils Absolute 0.0 0.0 - 0.1 K/uL   Immature Granulocytes 0 %   Abs Immature Granulocytes 0.01 0.00 - 0.07 K/uL    Comment: Performed at [facility], [address], [place], [place] [address]  Acetaminophen level     Status: None   Collection Time: 07/26/19 10:08 AM  Result Value Ref Range   Acetaminophen (Tylenol), Serum 19 10 - 30 ug/mL    Comment: (NOTE) Therapeutic concentrations vary significantly. A range of 10-30 ug/mL  may be an effective concentration for many patients. However, some  are best treated at concentrations outside of this range. Acetaminophen concentrations >150 ug/mL at 4 hours after ingestion  and >50 ug/mL at 12 hours after ingestion are often associated with  toxic reactions. Performed at [facility], [address] [place], [place] [address]   Salicylate level     Status: Abnormal   Collection Time: 07/26/19 10:08 AM  Result Value Ref Range   Salicylate Lvl <7.0 (L) 7.0 - 30.0 mg/dL    Comment: Performed at [facility], [address], [place], [place] [address]  Ethanol     Status: None   Collection Time: 07/26/19 10:08 AM  Result Value Ref Range   Alcohol, Ethyl (B) <10 <10 mg/dL    Comment: (NOTE) Lowest detectable limit for serum alcohol is 10 mg/dL. For medical purposes only. Performed at [facility] Lab, [address] [place], [place] [address]   Lithium level     Status: Abnormal   Collection Time: 07/26/19 10:08 AM  Result Value Ref Range    Lithium Lvl <0.06 (L) 0.60 - 1.20 mmol/L    Comment: Performed at [facility], [address] [place], [place] [address]  Magnesium     Status: None   Collection Time: 07/26/19 10:08 AM  Result Value Ref Range   Magnesium 2.1 1.7 - 2.4 mg/dL    Comment: Performed at [facility], [address] [place], [place] [address]  Respiratory Panel by RT PCR (Flu A&B, Covid) - Nasopharyngeal Swab     Status: None   Collection Time: 07/26/19 12:00 PM   Specimen: Nasopharyngeal Swab  Result Value Ref Range   SARS Coronavirus 2 by RT PCR NEGATIVE NEGATIVE    Comment: (NOTE) SARS-CoV-2 target nucleic acids are NOT DETECTED. The SARS-CoV-2 RNA is generally detectable in upper respiratoy specimens during the acute phase of infection. The lowest concentration of SARS-CoV-2 viral copies this assay can detect is 131 copies/mL. A negative result does not preclude SARS-Cov-2 infection and should not be used as the sole basis for treatment or other patient management decisions. A negative result may occur with  improper specimen collection/handling, submission of specimen other than nasopharyngeal swab, presence of viral mutation(s) within the areas targeted by this assay, and inadequate number of viral copies (<131 copies/mL). A negative result must be combined with clinical observations, patient history, and epidemiological information. The expected result is Negative. Fact Sheet for Patients:  https://www.moore.com/ Fact Sheet for Healthcare Providers:  https://www.young.biz/ This test is not yet ap proved or cleared by the [place] FDA and  has been authorized for detection and/or diagnosis of SARS-CoV-2 by FDA under  an Emergency Use Authorization (EUA). This EUA will remain  in effect (meaning this test can be used) for the duration of the COVID-19 declaration under Section 564(b)(1) of the Act, 21 U.S.C. section 360bbb-3(b)(1), unless the  authorization is terminated or revoked sooner.    Influenza A by PCR NEGATIVE NEGATIVE   Influenza B by PCR NEGATIVE NEGATIVE    Comment: (NOTE) The Xpert Xpress SARS-CoV-2/FLU/RSV assay is intended as an aid in  the diagnosis of influenza from Nasopharyngeal swab specimens and  should not be used as a sole basis for treatment. Nasal washings and  aspirates are unacceptable for Xpert Xpress SARS-CoV-2/FLU/RSV  testing. Fact Sheet for Patients: https://www.moore.com/ Fact Sheet for Healthcare Providers: https://www.young.biz/ This test is not yet approved or cleared by the [place] FDA and  has been authorized for detection and/or diagnosis of SARS-CoV-2 by  FDA under an Emergency Use Authorization (EUA). This EUA will remain  in effect (meaning this test can be used) for the duration of the  Covid-19 declaration under Section 564(b)(1) of the Act, 21  U.S.C. section 360bbb-3(b)(1), unless the authorization is  terminated or revoked. Performed at [facility], [address], [place], [place] [address]   Type and screen [facility]     Status: None   Collection Time: 07/26/19 12:02 PM  Result Value Ref Range   ABO/RH(D) O POS    Antibody Screen NEG    Sample Expiration      07/29/2019,2359 Performed at [facility], [address] [place], [place] [address]   Prepare Pheresed Platelets     Status: None   Collection Time: 07/26/19 12:02 PM  Result Value Ref Range   Unit Number [id number]    Blood Component Type PLTP LR1 PAS    Unit division 00    Status of Unit ISSUED,FINAL    Transfusion Status      OK TO TRANSFUSE Performed at [facility], [address], [place], [place] [address]   ABO/Rh     Status: None   Collection Time: 07/26/19 12:02 PM  Result Value Ref Range   ABO/RH(D)      O POS Performed at [facility], [address], [place], [place] [address]   CBC     Status:  Abnormal   Collection Time: 07/26/19 12:04 PM  Result Value Ref Range   WBC 4.9 4.0 - 10.5 K/uL   RBC 4.09 (L) 4.22 - 5.81 MIL/uL   Hemoglobin 12.7 (L) 13.0 - 17.0 g/dL   HCT 46.9 (L) 62.9 - 52.8 %   MCV 92.4 80.0 - 100.0 fL   MCH 31.1 26.0 - 34.0 pg   MCHC 33.6 30.0 - 36.0 g/dL   RDW 41.3 (H) 24.4 - 01.0 %   Platelets <5 (LL) 150 - 400 K/uL    Comment: REPEATED TO VERIFY PLATELET COUNT CONFIRMED BY SMEAR Immature Platelet Fraction may be clinically indicated, consider ordering this additional test [id number] CRITICAL RESULT CALLED TO, READ BACK BY AND VERIFIED WITH: N. ZOHBI, RN 1311 07/26/2019 BY MACEDA,J. RESULTS VERIFIED VIA RECOLLECT    nRBC 0.0 0.0 - 0.2 %    Comment: Performed at [facility], [address] [place], [place] [address]  Comprehensive metabolic panel     Status: Abnormal   Collection Time: 07/26/19  3:00 PM  Result Value Ref Range   Sodium 134 (L) 135 - 145 mmol/L   Potassium 3.7 3.5 - 5.1  mmol/L   Chloride 103 98 - 111 mmol/L   CO2 21 (L) 22 - 32 mmol/L   Glucose, Bld 129 (H) 70 - 99 mg/dL   BUN 12 6 - 20 mg/dL   Creatinine, Ser 1.23 0.61 - 1.24 mg/dL   Calcium 8.2 (L) 8.9 - 10.3 mg/dL   Total Protein 6.2 (L) 6.5 - 8.1 g/dL   Albumin 3.3 (L) 3.5 - 5.0 g/dL   AST 421 (H) 15 - 41 U/L   ALT 239 (H) 0 - 44 U/L   Alkaline Phosphatase 54 38 - 126 U/L   Total Bilirubin 2.2 (H) 0.3 - 1.2 mg/dL   GFR calc non Af Amer >60 >60 mL/min   GFR calc Af Amer >60 >60 mL/min   Anion gap 10 5 - 15    Comment: Performed at [place] [address], [place], [place] [address]  Ammonia     Status: None   Collection Time: 07/26/19  3:00 PM  Result Value Ref Range   Ammonia 16 9 - 35 umol/L    Comment: Performed at [facility], [place] [address], [place], [place] [address]  HIV Antibody (routine testing w rflx)     Status: None   Collection Time: 07/26/19  3:00 PM  Result Value Ref Range   HIV Screen 4th Generation wRfx NON REACTIVE NON REACTIVE     Comment: Performed at [place] [address], [place], [place] [address]  Acetaminophen level (recheck)     Status: None   Collection Time: 07/26/19  3:00 PM  Result Value Ref Range   Acetaminophen (Tylenol), Serum 10 10 - 30 ug/mL    Comment: (NOTE) Therapeutic concentrations vary significantly. A range of 10-30 ug/mL  may be an effective concentration for many patients. However, some  are best treated at concentrations outside of this range. Acetaminophen concentrations >150 ug/mL at 4 hours after ingestion  and >50 ug/mL at 12 hours after ingestion are often associated with  toxic reactions. Performed at [facility], [place] [address] [place], [place] [address]   Urine rapid drug screen (hosp performed)     Status: Abnormal   Collection Time: 07/26/19  3:19 PM  Result Value Ref Range   Opiates NONE DETECTED NONE DETECTED   Cocaine NONE DETECTED NONE DETECTED   Benzodiazepines NONE DETECTED NONE DETECTED   Amphetamines NONE DETECTED NONE DETECTED   Tetrahydrocannabinol POSITIVE (A) NONE DETECTED   Barbiturates NONE DETECTED NONE DETECTED    Comment: (NOTE) DRUG SCREEN FOR MEDICAL PURPOSES ONLY.  IF CONFIRMATION IS NEEDED FOR ANY PURPOSE, NOTIFY LAB WITHIN 5 DAYS. LOWEST DETECTABLE LIMITS FOR URINE DRUG SCREEN Drug Class                     Cutoff (ng/mL) Amphetamine and metabolites    1000 Barbiturate and metabolites    200 Benzodiazepine                 631 Tricyclics and metabolites     300 Opiates and metabolites        300 Cocaine and metabolites        300 THC                            50 Performed at [facility], [place] [address] [place], [place] [address]   Urinalysis, Routine w reflex microscopic  Status: Abnormal   Collection Time: 07/26/19  3:19 PM  Result Value Ref Range   Color, Urine YELLOW YELLOW   APPearance CLEAR CLEAR   Specific Gravity, Urine 1.026 1.005 - 1.030   pH 5.0 5.0 - 8.0   Glucose, UA NEGATIVE NEGATIVE mg/dL    Hgb urine dipstick NEGATIVE NEGATIVE   Bilirubin Urine NEGATIVE NEGATIVE   Ketones, ur 80 (A) NEGATIVE mg/dL   Protein, ur NEGATIVE NEGATIVE mg/dL   Nitrite NEGATIVE NEGATIVE   Leukocytes,Ua NEGATIVE NEGATIVE    Comment: Performed at [facility], [address] [place], [place] [address]  Lactic acid, plasma     Status: None   Collection Time: 07/26/19  4:00 PM  Result Value Ref Range   Lactic Acid, Venous 0.8 0.5 - 1.9 mmol/L    Comment: Performed at [facility], [address] [place], [place] [address]  Magnesium     Status: None   Collection Time: 07/26/19  5:30 PM  Result Value Ref Range   Magnesium 2.2 1.7 - 2.4 mg/dL    Comment: Performed at [facility], [address], [place], [place] [address]  CK     Status: None   Collection Time: 07/26/19  5:30 PM  Result Value Ref Range   Total CK 125 49 - 397 U/L    Comment: Performed at [facility], [address], [place], [place] [address]  Troponin I (High Sensitivity)     Status: None   Collection Time: 07/26/19  6:20 PM  Result Value Ref Range   Troponin I (High Sensitivity) 7 <18 ng/L    Comment: (NOTE) Elevated high sensitivity troponin I (hsTnI) values and significant  changes across serial measurements may suggest ACS but many other  chronic and acute conditions are known to elevate hsTnI results.  Refer to the "Links" section for chest pain algorithms and additional  guidance. Performed at [facility], [address] [place], [place] [address]   Lactic acid, plasma     Status: None   Collection Time: 07/26/19  8:09 PM  Result Value Ref Range   Lactic Acid, Venous 1.5 0.5 - 1.9 mmol/L    Comment: Performed at [facility], [address], [place], [place] [address]  Comprehensive metabolic panel     Status: Abnormal   Collection Time: 07/26/19  8:09 PM  Result Value Ref Range   Sodium 138 135 - 145 mmol/L   Potassium 3.7 3.5 - 5.1 mmol/L   Chloride 106 98 - 111 mmol/L    CO2 21 (L) 22 - 32 mmol/L   Glucose, Bld 102 (H) 70 - 99 mg/dL   BUN 11 6 - 20 mg/dL   Creatinine, Ser 4.19 (H) 0.61 - 1.24 mg/dL   Calcium 8.4 (L) 8.9 - 10.3 mg/dL   Total Protein 6.0 (L) 6.5 - 8.1 g/dL   Albumin 3.0 (L) 3.5 - 5.0 g/dL   AST 379 (H) 15 - 41 U/L   ALT 328 (H) 0 - 44 U/L   Alkaline Phosphatase 61 38 - 126 U/L   Total Bilirubin 2.4 (H) 0.3 - 1.2 mg/dL   GFR calc non Af Amer >60 >60 mL/min   GFR calc Af Amer >60 >60 mL/min   Anion gap 11 5 - 15    Comment: Performed at [facility], [address], [place], [place] [address]  CBC with Differential/Platelet     Status: Abnormal  Collection Time: 07/26/19  8:09 PM  Result Value Ref Range   WBC 5.8 4.0 - 10.5 K/uL   RBC 4.07 (L) 4.22 - 5.81 MIL/uL   Hemoglobin 12.6 (L) 13.0 - 17.0 g/dL   HCT 14.7 (L) 82.9 - 56.2 %   MCV 91.9 80.0 - 100.0 fL   MCH 31.0 26.0 - 34.0 pg   MCHC 33.7 30.0 - 36.0 g/dL   RDW 13.0 (H) 86.5 - 78.4 %   Platelets 9 (LL) 150 - 400 K/uL    Comment: REPEATED TO VERIFY Immature Platelet Fraction may be clinically indicated, consider ordering this additional test [id number] CRITICAL VALUE NOTED.  VALUE IS CONSISTENT WITH PREVIOUSLY REPORTED AND CALLED VALUE.    nRBC 0.0 0.0 - 0.2 %   Neutrophils Relative % 77 %   Neutro Abs 4.5 1.7 - 7.7 K/uL   Lymphocytes Relative 17 %   Lymphs Abs 1.0 0.7 - 4.0 K/uL   Monocytes Relative 4 %   Monocytes Absolute 0.2 0.1 - 1.0 K/uL   Eosinophils Relative 1 %   Eosinophils Absolute 0.1 0.0 - 0.5 K/uL   Basophils Relative 1 %   Basophils Absolute 0.1 0.0 - 0.1 K/uL   Immature Granulocytes 0 %   Abs Immature Granulocytes 0.02 0.00 - 0.07 K/uL    Comment: Performed at [facility], [address] [place], [place] [address]  Protime-INR     Status: Abnormal   Collection Time: 07/26/19  8:09 PM  Result Value Ref Range   Prothrombin Time 21.7 (H) 11.4 - 15.2 seconds   INR 1.9 (H) 0.8 - 1.2    Comment: (NOTE) INR goal varies based on device and  disease states. Performed at [facility], [address] [place], [place] [address]   Troponin I (High Sensitivity)     Status: None   Collection Time: 07/26/19  8:09 PM  Result Value Ref Range   Troponin I (High Sensitivity) 4 <18 ng/L    Comment: (NOTE) Elevated high sensitivity troponin I (hsTnI) values and significant  changes across serial measurements may suggest ACS but many other  chronic and acute conditions are known to elevate hsTnI results.  Refer to the "Links" section for chest pain algorithms and additional  guidance. Performed at [facility], [address] [place], [place] [address]   MRSA PCR Screening     Status: None   Collection Time: 07/26/19  9:28 PM   Specimen: Nasopharyngeal  Result Value Ref Range   MRSA by PCR NEGATIVE NEGATIVE    Comment:        The GeneXpert MRSA Assay (FDA approved for NASAL specimens only), is one component of a comprehensive MRSA colonization surveillance program. It is not intended to diagnose MRSA infection nor to guide or monitor treatment for MRSA infections. Performed at [facility], [address] [place], [place] [address]   Comprehensive metabolic panel     Status: Abnormal   Collection Time: 07/27/19  6:37 AM  Result Value Ref Range   Sodium 137 135 - 145 mmol/L   Potassium 3.5 3.5 - 5.1 mmol/L   Chloride 107 98 - 111 mmol/L   CO2 19 (L) 22 - 32 mmol/L   Glucose, Bld 77 70 - 99 mg/dL   BUN 9 6 - 20 mg/dL   Creatinine, Ser 4.74 (H) 0.61 - 1.24 mg/dL   Calcium 8.0 (L) 8.9 - 10.3 mg/dL   Total Protein 5.4 (L) 6.5 -  8.1 g/dL   Albumin 2.7 (L) 3.5 - 5.0 g/dL   AST 1,6101,316 (H) 15 - 41 U/L   ALT 751 (H) 0 - 44 U/L   Alkaline Phosphatase 52 38 - 126 U/L   Total Bilirubin 2.7 (H) 0.3 - 1.2 mg/dL   GFR calc non Af Amer >60 >60 mL/min   GFR calc Af Amer >60 >60 mL/min   Anion gap 11 5 - 15    Comment: Performed at [facility], [address] [place], [place] [address]  CBC with  Differential/Platelet     Status: Abnormal   Collection Time: 07/27/19  6:37 AM  Result Value Ref Range   WBC 2.9 (L) 4.0 - 10.5 K/uL   RBC 3.71 (L) 4.22 - 5.81 MIL/uL   Hemoglobin 11.5 (L) 13.0 - 17.0 g/dL   HCT 40.933.5 (L) 81.139.0 - 91.452.0 %   MCV 90.3 80.0 - 100.0 fL   MCH 31.0 26.0 - 34.0 pg   MCHC 34.3 30.0 - 36.0 g/dL   RDW 78.215.9 (H) 95.611.5 - 21.315.5 %   Platelets <5 (LL) 150 - 400 K/uL    Comment: REPEATED TO VERIFY PLATELET COUNT CONFIRMED BY SMEAR Immature Platelet Fraction may be clinically indicated, consider ordering this additional test [id number] CRITICAL VALUE NOTED.  VALUE IS CONSISTENT WITH PREVIOUSLY REPORTED AND CALLED VALUE.    nRBC 0.0 0.0 - 0.2 %   Neutrophils Relative % 59 %   Neutro Abs 1.7 1.7 - 7.7 K/uL   Lymphocytes Relative 28 %   Lymphs Abs 0.8 0.7 - 4.0 K/uL   Monocytes Relative 7 %   Monocytes Absolute 0.2 0.1 - 1.0 K/uL   Eosinophils Relative 5 %   Eosinophils Absolute 0.1 0.0 - 0.5 K/uL   Basophils Relative 1 %   Basophils Absolute 0.0 0.0 - 0.1 K/uL   Smear Review MORPHOLOGY UNREMARKABLE    Immature Granulocytes 0 %   Abs Immature Granulocytes 0.01 0.00 - 0.07 K/uL    Comment: Performed at [facility], [address] [place], [place] [address]  Protime-INR     Status: Abnormal   Collection Time: 07/27/19  6:37 AM  Result Value Ref Range   Prothrombin Time 23.6 (H) 11.4 - 15.2 seconds   INR 2.1 (H) 0.8 - 1.2    Comment: (NOTE) INR goal varies based on device and disease states. Performed at [facility], [address] [place], [place] [address]   Acetaminophen level     Status: Abnormal   Collection Time: 07/27/19  6:37 AM  Result Value Ref Range   Acetaminophen (Tylenol), Serum <10 (L) 10 - 30 ug/mL    Comment: (NOTE) Therapeutic concentrations vary significantly. A range of 10-30 ug/mL  may be an effective concentration for many patients. However, some  are best treated at concentrations outside of this range. Acetaminophen  concentrations >150 ug/mL at 4 hours after ingestion  and >50 ug/mL at 12 hours after ingestion are often associated with  toxic reactions. Performed at [facility], [address] [address] [place], [place] [address]   Vitamin B12     Status: None   Collection Time: 07/27/19  6:37 AM  Result Value Ref Range   Vitamin B-12 853 180 - 914 pg/mL    Comment: (NOTE) This assay is not validated for testing neonatal or myeloproliferative syndrome specimens for Vitamin B12 levels. Performed at [facility], [address] [place], [place] [address]   Prepare  Pheresed Platelets     Status: None (Preliminary result)   Collection Time: 07/27/19  7:55 AM  Result Value Ref Range   Unit Number [id number]    Blood Component Type PLTP LR1 PAS    Unit division 00    Status of Unit ISSUED    Transfusion Status      OK TO TRANSFUSE Performed at [facility], [address], [place], [place] [address]     Medications:  Current Facility-Administered Medications  Medication Dose Route Frequency Provider Last Rate Last Admin  . 0.9 %  sodium chloride infusion (Manually program via Guardrails IV Fluids)   Intravenous Once Mikey College T, MD      . 0.9 %  sodium chloride infusion (Manually program via Guardrails IV Fluids)   Intravenous Once Mikey College T, MD      . 0.9 %  sodium chloride infusion (Manually program via Guardrails IV Fluids)   Intravenous Once Bertis Ruddy, Ni, MD      . 0.9 %  sodium chloride infusion   Intravenous Continuous Mikey College T, MD 150 mL/hr at 07/26/19 1322 New Bag at 07/26/19 1322  . acetylcysteine (ACETADOTE) 24,000 mg in dextrose 5 % 600 mL (40 mg/mL) infusion  15 mg/kg/hr Intravenous Continuous Benjiman Core, MD 28.9 mL/hr at 07/27/19 0438 15 mg/kg/hr at 07/27/19 0438  . hydrALAZINE (APRESOLINE) tablet 25 mg  25 mg Oral Q6H PRN Mikey College T, MD      . ondansetron [facility]) injection 4 mg  4 mg Intravenous Once Emeline General, MD   Stopped at 07/26/19 1432  .  pantoprazole (PROTONIX) injection 40 mg  40 mg Intravenous Q12H Mikey College T, MD   40 mg at 07/27/19 0831    Musculoskeletal: Strength & Muscle Tone: within normal limits Gait & Station: normal Patient leans: N/A  Psychiatric Specialty Exam: Physical Exam  Review of Systems  Blood pressure 126/79, pulse 68, temperature 98 F (36.7 C), temperature source Oral, resp. rate 15, height 6\' 3"  (1.905 m), weight 69.7 kg, SpO2 100 %.Body mass index is 19.21 kg/m.  General Appearance: Casual  Eye Contact:  Good  Speech:  Clear and Coherent and Normal Rate  Volume:  Normal  Mood:  Depressed  Affect:  Congruent and Depressed  Thought Process:  Coherent, Goal Directed and Descriptions of Associations: Intact  Orientation:  Full (Time, Place, and Person)  Thought Content:  WDL and Logical  Suicidal Thoughts:  Yes.  without intent/plan  Homicidal Thoughts:  No  Memory:  Immediate;   Good Recent;   Good Remote;   Good  Judgement:  Fair  Insight:  Good  Psychomotor Activity:  Normal  Concentration:  Concentration: Good and Attention Span: Good  Recall:  Good  Fund of Knowledge:  Good  Language:  Good  Akathisia:  No  Handed:  Right  AIMS (if indicated):     Assets:  Communication Skills Desire for Improvement Financial Resources/Insurance Housing Intimacy Leisure Time Physical Health Resilience Social Support Talents/Skills  ADL's:  Intact  Cognition:  WNL  Sleep:        Treatment Plan Summary: Plan Recommend inpatient psychiatric treatment once medically cleared  Disposition: Recommend psychiatric Inpatient admission when medically cleared. Supportive therapy provided about ongoing stressors. Discussed crisis plan, support from social network, calling 911, coming to the Emergency Department, and calling Suicide Hotline.  This service was provided via telemedicine using a 2-way, interactive audio and video technology.  Names of all persons participating in  this  telemedicine service and their role in this encounter. Name: Jola Baptistonald Gunnels Role: Patient  Name: Berneice Heinrichina Tate Role: FNP    Patrcia Dollyina L Tate, FNP 07/27/2019 9:47 AM

## 2019-07-27 NOTE — Plan of Care (Signed)
  Problem: Education: Goal: Knowledge of General Education information will improve Description: Including pain rating scale, medication(s)/side effects and non-pharmacologic comfort measures Outcome: Progressing   Problem: Health Behavior/Discharge Planning: Goal: Ability to manage health-related needs will improve Outcome: Progressing   Problem: Clinical Measurements: Goal: Diagnostic test results will improve Outcome: Progressing   Problem: Activity: Goal: Risk for activity intolerance will decrease Outcome: Progressing   Problem: Safety: Goal: Ability to remain free from injury will improve Outcome: Progressing   

## 2019-07-28 DIAGNOSIS — K7110 Toxic liver disease with hepatic necrosis, without coma: Secondary | ICD-10-CM

## 2019-07-28 DIAGNOSIS — F329 Major depressive disorder, single episode, unspecified: Secondary | ICD-10-CM

## 2019-07-28 DIAGNOSIS — D61818 Other pancytopenia: Secondary | ICD-10-CM

## 2019-07-28 DIAGNOSIS — D689 Coagulation defect, unspecified: Secondary | ICD-10-CM

## 2019-07-28 DIAGNOSIS — T391X2A Poisoning by 4-Aminophenol derivatives, intentional self-harm, initial encounter: Principal | ICD-10-CM

## 2019-07-28 LAB — CBC WITH DIFFERENTIAL/PLATELET
Abs Immature Granulocytes: 0.02 10*3/uL (ref 0.00–0.07)
Abs Immature Granulocytes: 0.02 10*3/uL (ref 0.00–0.07)
Basophils Absolute: 0 10*3/uL (ref 0.0–0.1)
Basophils Absolute: 0 10*3/uL (ref 0.0–0.1)
Basophils Relative: 1 %
Basophils Relative: 1 %
Eosinophils Absolute: 0.1 10*3/uL (ref 0.0–0.5)
Eosinophils Absolute: 0.1 10*3/uL (ref 0.0–0.5)
Eosinophils Relative: 3 %
Eosinophils Relative: 3 %
HCT: 32.7 % — ABNORMAL LOW (ref 39.0–52.0)
HCT: 30.5 % — ABNORMAL LOW (ref 39.0–52.0)
Hemoglobin: 11.2 g/dL — ABNORMAL LOW (ref 13.0–17.0)
Hemoglobin: 10.8 g/dL — ABNORMAL LOW (ref 13.0–17.0)
Immature Granulocytes: 1 %
Immature Granulocytes: 1 %
Lymphocytes Relative: 24 %
Lymphocytes Relative: 19 %
Lymphs Abs: 0.6 10*3/uL — ABNORMAL LOW (ref 0.7–4.0)
Lymphs Abs: 0.6 10*3/uL — ABNORMAL LOW (ref 0.7–4.0)
MCH: 31 pg (ref 26.0–34.0)
MCH: 31.3 pg (ref 26.0–34.0)
MCHC: 34.3 g/dL (ref 30.0–36.0)
MCHC: 35.4 g/dL (ref 30.0–36.0)
MCV: 90.6 fL (ref 80.0–100.0)
MCV: 88.4 fL (ref 80.0–100.0)
Monocytes Absolute: 0.1 10*3/uL (ref 0.1–1.0)
Monocytes Absolute: 0.2 10*3/uL (ref 0.1–1.0)
Monocytes Relative: 5 %
Monocytes Relative: 5 %
Neutro Abs: 1.7 10*3/uL (ref 1.7–7.7)
Neutro Abs: 2.2 10*3/uL (ref 1.7–7.7)
Neutrophils Relative %: 66 %
Neutrophils Relative %: 71 %
Platelets: 6 10*3/uL — CL (ref 150–400)
Platelets: 22 10*3/uL — CL (ref 150–400)
RBC: 3.61 MIL/uL — ABNORMAL LOW (ref 4.22–5.81)
RBC: 3.45 MIL/uL — ABNORMAL LOW (ref 4.22–5.81)
RDW: 16.1 % — ABNORMAL HIGH (ref 11.5–15.5)
RDW: 15.6 % — ABNORMAL HIGH (ref 11.5–15.5)
WBC: 2.6 10*3/uL — ABNORMAL LOW (ref 4.0–10.5)
WBC: 3.1 10*3/uL — ABNORMAL LOW (ref 4.0–10.5)
nRBC: 0 % (ref 0.0–0.2)
nRBC: 0 % (ref 0.0–0.2)

## 2019-07-28 LAB — COMPREHENSIVE METABOLIC PANEL
ALT: 3342 U/L — ABNORMAL HIGH (ref 0–44)
ALT: 5567 U/L — ABNORMAL HIGH (ref 0–44)
AST: 5857 U/L — ABNORMAL HIGH (ref 15–41)
AST: 9140 U/L — ABNORMAL HIGH (ref 15–41)
Albumin: 2.4 g/dL — ABNORMAL LOW (ref 3.5–5.0)
Albumin: 2.7 g/dL — ABNORMAL LOW (ref 3.5–5.0)
Alkaline Phosphatase: 54 U/L (ref 38–126)
Alkaline Phosphatase: 65 U/L (ref 38–126)
Anion gap: 9 (ref 5–15)
Anion gap: 9 (ref 5–15)
BUN: <5 mg/dL — ABNORMAL LOW (ref 6–20)
BUN: <5 mg/dL — ABNORMAL LOW (ref 6–20)
CO2: 20 mmol/L — ABNORMAL LOW (ref 22–32)
CO2: 21 mmol/L — ABNORMAL LOW (ref 22–32)
Calcium: 8 mg/dL — ABNORMAL LOW (ref 8.9–10.3)
Calcium: 8.4 mg/dL — ABNORMAL LOW (ref 8.9–10.3)
Chloride: 109 mmol/L (ref 98–111)
Chloride: 106 mmol/L (ref 98–111)
Creatinine, Ser: 1.07 mg/dL (ref 0.61–1.24)
Creatinine, Ser: 1.08 mg/dL (ref 0.61–1.24)
GFR calc Af Amer: >60 mL/min (ref >60)
GFR calc Af Amer: >60 mL/min (ref >60)
GFR calc non Af Amer: >60 mL/min (ref >60)
GFR calc non Af Amer: >60 mL/min (ref >60)
Glucose, Bld: 82 mg/dL (ref 70–99)
Glucose, Bld: 114 mg/dL — ABNORMAL HIGH (ref 70–99)
Potassium: 3.5 mmol/L (ref 3.5–5.1)
Potassium: 3.6 mmol/L (ref 3.5–5.1)
Sodium: 138 mmol/L (ref 135–145)
Sodium: 136 mmol/L (ref 135–145)
Total Bilirubin: 1.9 mg/dL — ABNORMAL HIGH (ref 0.3–1.2)
Total Bilirubin: 2 mg/dL — ABNORMAL HIGH (ref 0.3–1.2)
Total Protein: 5 g/dL — ABNORMAL LOW (ref 6.5–8.1)
Total Protein: 5.4 g/dL — ABNORMAL LOW (ref 6.5–8.1)

## 2019-07-28 LAB — PREPARE PLATELET PHERESIS: Unit division: 0

## 2019-07-28 LAB — BPAM PLATELET PHERESIS
Blood Product Expiration Date: 202102052359
ISSUE DATE / TIME: 202102030921
Unit Type and Rh: 6200

## 2019-07-28 LAB — MAGNESIUM: Magnesium: 1.8 mg/dL (ref 1.7–2.4)

## 2019-07-28 LAB — PROTIME-INR
INR: 3.9 — ABNORMAL HIGH (ref 0.8–1.2)
INR: 2.6 — ABNORMAL HIGH (ref 0.8–1.2)
Prothrombin Time: 37.9 seconds — ABNORMAL HIGH (ref 11.4–15.2)
Prothrombin Time: 27.5 seconds — ABNORMAL HIGH (ref 11.4–15.2)

## 2019-07-28 MED ORDER — POTASSIUM CHLORIDE 10 MEQ/100ML IV SOLN
10.0000 meq | INTRAVENOUS | Status: AC
  Administered 2019-07-28 (×2): 10 meq via INTRAVENOUS
  Filled 2019-07-28 (×2): qty 100

## 2019-07-28 MED ORDER — MAGNESIUM SULFATE 4 GM/100ML IV SOLN
4.0000 g | Freq: Once | INTRAVENOUS | Status: AC
  Administered 2019-07-28: 4 g via INTRAVENOUS
  Filled 2019-07-28: qty 100

## 2019-07-28 MED ORDER — SODIUM CHLORIDE 0.9% IV SOLUTION: Freq: Once | INTRAVENOUS | Status: DC

## 2019-07-28 MED ORDER — POTASSIUM CHLORIDE 10 MEQ/100ML IV SOLN
INTRAVENOUS | Status: AC
  Filled 2019-07-28: qty 100

## 2019-07-28 MED ORDER — PHYTONADIONE 5 MG PO TABS
5.0000 mg | ORAL_TABLET | Freq: Once | ORAL | Status: AC
  Administered 2019-07-28: 5 mg via ORAL
  Filled 2019-07-28: qty 1

## 2019-07-28 NOTE — Progress Notes (Signed)
Dean Howard   DOB:10-13-1965   AY#:[id number]    ASSESSMENT & PLAN:  Thrombocytopenia, with also signs of progressive leukopenia and anemia This is consistent with profound bone marrow suppression in the setting of severe alcoholism and recent acetaminophen along with ibuprofen poisoning I recommend we continue platelet transfusion support daily to keep platelet count above 10,000 if possible We discussed some of the risks, benefits, and alternatives of platelets transfusions. The patient is symptomatic from low platelet counts with high risk of life-threatening bleeding and the platelet count is critically low.  Some of the side-effects to be expected including risks of transfusion reactions, chills, infection, syndrome of volume overload and risk of hospitalization from various reasons and the patient is willing to proceed and went ahead to sign consent today.  Acquired coagulopathy secondary to liver failure His INR continues to rise I recommend 2 units of fresh frozen plasma today I will order fibrinogen tomorrow to see if you need cryoprecipitate  Bipolar disorder and schizophrenia, severe depression He has been evaluated by psychiatrist with plan to admit to psychiatric unit once he is stabilized  Acute liver failure Secondary to acetaminophen poisoning Continue N-acetylcysteine and supportive care  Discharge planning The patient is developing multiorgan failure If his condition deteriorates, he might need to be transferred to the intensive care unit for further aggressive monitoring I do not anticipate he will be leaving the hospital over the next 3 to 5 days at least  All questions were answered. The patient knows to call the clinic with any problems, questions or concerns.   Artis Delay, MD 07/28/2019 7:31 AM  Subjective:  The patient has started to notice some intermittent nosebleed.  He denies rectal bleeding or hematuria.  He is tolerating aggressive IV fluids well  without problems with fluid overload  Objective:  Vitals:   07/27/19 2354 07/28/19 0359  BP: (!) 121/59   Pulse:    Resp:    Temp: 98.1 F (36.7 C) 98.2 F (36.8 C)  SpO2:       Intake/Output Summary (Last 24 hours) at 07/28/2019 0731 Last data filed at 07/28/2019 0654 Gross per 24 hour  Intake 3212.46 ml  Output 1950 ml  Net 1262.46 ml    GENERAL:alert, no distress and comfortable.  He is occasionally tremulous but appears to be fully oriented with no signs of encephalopathy.  Noted blood-tinged towel by his bedside SKIN: skin color, texture, turgor are normal, no rashes or significant lesions EYES: normal, Conjunctiva are pink and non-injected, sclera clear OROPHARYNX:no exudate, no erythema and lips, buccal mucosa, and tongue normal  NECK: supple, thyroid normal size, non-tender, without nodularity LYMPH:  no palpable lymphadenopathy in the cervical, axillary or inguinal LUNGS: clear to auscultation and percussion with normal breathing effort HEART: regular rate & rhythm and no murmurs and no lower extremity edema ABDOMEN:abdomen soft, mildly distended. Musculoskeletal:no cyanosis of digits and no clubbing  NEURO: alert & oriented x 3 with fluent speech, no focal motor/sensory deficits   Labs:  Recent Labs    07/27/19 0637 07/27/19 1649 07/28/19 0608  NA 137 135 138  K 3.5 3.5 3.5  CL 107 106 109  CO2 19* 19* 20*  GLUCOSE 77 121* 82  BUN 9 7 <5*  CREATININE 1.27* 1.21 1.07  CALCIUM 8.0* 8.3* 8.0*  GFRNONAA >60 >60 >60  GFRAA >60 >60 >60  PROT 5.4* 5.1* 5.0*  ALBUMIN 2.7* 2.6* 2.4*  AST 1,316* 2,582* PENDING  ALT 751* 1,692* PENDING  ALKPHOS 52  56 54  BILITOT 2.7* 2.6* 1.9*    Studies:  DG Chest Port 1 View  Result Date: 07/26/2019 CLINICAL DATA:  Intentional drug overdose. EXAM: PORTABLE CHEST 1 VIEW COMPARISON:  June 20, 2019. FINDINGS: The heart size and mediastinal contours are within normal limits. Both lungs are clear. The visualized skeletal  structures are unremarkable. IMPRESSION: No active disease. Electronically Signed   By: Marijo Conception M.D.   On: 07/26/2019 12:44   US Abdomen Limited RUQ  Result Date: 07/26/2019 CLINICAL DATA:  LFT elevation EXAM: ULTRASOUND ABDOMEN LIMITED RIGHT UPPER QUADRANT COMPARISON:  None. FINDINGS: Gallbladder: No gallstones or wall thickening visualized. No sonographic Murphy sign noted by sonographer. Common bile duct: Diameter: 5 mm Liver: Increased echotexture seen throughout. No focal abnormality or biliary ductal dilatation. Portal vein is patent on color Doppler imaging with normal direction of blood flow towards the liver. Other: None. IMPRESSION: Mild hepatic steatosis.  Normal appearing gallbladder. Electronically Signed   By: Prudencio Pair M.D.   On: 07/26/2019 19:02

## 2019-07-28 NOTE — Plan of Care (Signed)
  Problem: Education: Goal: Knowledge of General Education information will improve Description: Including pain rating scale, medication(s)/side effects and non-pharmacologic comfort measures Outcome: Progressing   Problem: Health Behavior/Discharge Planning: Goal: Ability to manage health-related needs will improve Outcome: Progressing   Problem: Clinical Measurements: Goal: Diagnostic test results will improve Outcome: Progressing   Problem: Activity: Goal: Risk for activity intolerance will decrease Outcome: Progressing   Problem: Coping: Goal: Level of anxiety will decrease Outcome: Progressing   Problem: Safety: Goal: Ability to remain free from injury will improve Outcome: Progressing   

## 2019-07-28 NOTE — Progress Notes (Signed)
PROGRESS NOTE    Dean Howard  HKV:[id number] DOB: 1966-05-30 DOA: 07/26/2019 PCP: Patient, No Pcp Per    Brief Narrative:54 year old male admitted with intentional overdose.  He has significant history of depression, schizophrenia, bipolar disorder, MI in 2014 previous suicidal attempts and CAD.  He overdosed in 2020 on aspirin.  This time he presented with overdose of Tylenol and ibuprofen.  He took unknown number of 200 mg ibuprofen and 10 pills of 325 mg of Tylenol.  Prior to coming into the hospital he took another 20 tablets of 200 mg of ibuprofen and started to have sharp abdominal pain. Patient reports feeling depressed due to lack of health insurance and lack of getting his disability hearing postponed to April 2021. He drinks 18-24 beers per day and continues to smoke marijuana daily for the last 4 years. He lives at home with his sister and his mother. He was found to have severe thrombocytopenia.  Lately count less than 5000.  Assessment & Plan:   Principal Problem:   Overdose by acetaminophen Active Problems:   Tylenol overdose   Overdose   LFT elevation   Acquired coagulation disorder (HCC)    #1 intentional overdose on acetaminophen and ibuprofen in an intent to kill himself from severe depression. Patient complains of abdominal pain nausea and vomiting. Continue IV fluids, scopolamine patch for nausea Poison control recommends keeping his electrolytes K and mag above 4 and 2. On N  acetylcysteine drip to continue till INR is less than 2 and LFTS trending down. Vitamin k today  Urine drug screen positive for THC alcohol level less than 10 salicylate level less than 7 EKG today and daily QTC 463 down from 510  #2  Pancytopenia /severe acute thrombocytopenia/elevated INR- likely secondary to alcohol use appreciate input from hematology.  Platelet count still low at 6000.  Platelet transfusion ordered.  FFP ordered.  Will give vitamin K  Transfuse platelets to  keep platelets above 10,000. Checking CBC twice a day.  #3 multiple suicidal attempts psych consult appreciated.  They recommend inpatient psychiatric admission when he is medically stable.  #4 CKD stage I creatinine 1.07 continue IV fluids.    #5 acute liver toxicity secondary to #1- unfortunately his LFTs are trending up.   AST is 5857 from 1316 from 619 ALT is 3342 from 751 from 328 Total bilirubin 1.9 improved, INR 3.9 worsening. Consulted GI [name]. Continue N-acetylcysteine till INR less than 2 and LFTs start to trend down per poison control.  #6 alcohol abuse watch for withdrawals  #7 possible gastritis from overdose and alcohol use-continue Protonix.  Estimated body mass index is 19.21 kg/m as calculated from the following:   Height as of this encounter: 6\' 3"  (1.905 m).   Weight as of this encounter: 69.7 kg. DVT prophylaxis: SCD Code Status: Full code Family Communication: d/w mother and sister  Disposition Plan: Once medically stable he will be discharged to inpatient psych.  Patient came from home.  Barriers to discharge is patient with liver toxicity from intentional overdose on IV N-acetylcysteine symptomatic treatments close monitoring of electrolytes and EKG for QT prolongation.   Consultants: Psychiatry gi   Procedures: None Antimicrobials: None   Subjective: He is resting in bed sitter by the bedside he reports he was able to eat some food today.  His abdominal pain is improving.  Objective: Vitals:   07/28/19 0900 07/28/19 0909 07/28/19 0925 07/28/19 0941  BP:   (!) 126/59 (!) 132/50  Pulse: 60  61 (!) 55  Resp: 17  17 (!) 21  Temp:  97.9 F (36.6 C)  97.9 F (36.6 C)  TempSrc:  Oral  Axillary  SpO2: 99%  100% 100%  Weight:      Height:        Intake/Output Summary (Last 24 hours) at 07/28/2019 1047 Last data filed at 07/28/2019 0925 Gross per 24 hour  Intake 3476.46 ml  Output 1825 ml  Net 1651.46 ml   Filed Weights   07/26/19 1130  07/26/19 2140  Weight: 77.1 kg 69.7 kg    Examination:  General exam: Appears calm and comfortable  Respiratory system: Clear to auscultation. Respiratory effort normal. Cardiovascular system: S1 & S2 heard, RRR. No JVD, murmurs, rubs, gallops or clicks. No pedal edema. Gastrointestinal system: Abdomen is nondistended, soft and tender epigastrium.  No organomegaly or masses felt. Normal bowel sounds heard. Central nervous system: Alert and oriented. No focal neurological deficits. Extremities: Symmetric 5 x 5 power. Skin: No rashes, lesions or ulcers Psychiatry: Judgement and insight appear normal. Mood & affect appropriate.     Data Reviewed: I have personally reviewed following labs and imaging studies  CBC: Recent Labs  Lab 07/26/19 1008 07/26/19 1008 07/26/19 1204 07/26/19 2009 07/27/19 0637 07/27/19 1649 07/28/19 0608  WBC 5.4   < > 4.9 5.8 2.9* 2.8* 2.6*  NEUTROABS 4.5  --   --  4.5 1.7 1.9 1.7  HGB 13.0   < > 12.7* 12.6* 11.5* 11.6* 11.2*  HCT 38.9*   < > 37.8* 37.4* 33.5* 33.4* 32.7*  MCV 92.6   < > 92.4 91.9 90.3 88.8 90.6  PLT <5*   < > <5* 9* <5* 6* 6*   < > = values in this interval not displayed.   Basic Metabolic Panel: Recent Labs  Lab 07/26/19 1008 07/26/19 1008 07/26/19 1500 07/26/19 1730 07/26/19 2009 07/27/19 0637 07/27/19 1649 07/28/19 0608  NA 137   < > 134*  --  138 137 135 138  K 3.8   < > 3.7  --  3.7 3.5 3.5 3.5  CL 103   < > 103  --  106 107 106 109  CO2 24   < > 21*  --  21* 19* 19* 20*  GLUCOSE 112*   < > 129*  --  102* 77 121* 82  BUN 15   < > 12  --  11 9 7  <5*  CREATININE 1.48*   < > 1.23  --  1.32* 1.27* 1.21 1.07  CALCIUM 9.2   < > 8.2*  --  8.4* 8.0* 8.3* 8.0*  MG 2.1  --   --  2.2  --   --   --  1.8   < > = values in this interval not displayed.   GFR: Estimated Creatinine Clearance: 79.6 mL/min (by C-G formula based on SCr of 1.07 mg/dL). Liver Function Tests: Recent Labs  Lab 07/26/19 1500 07/26/19 2009  07/27/19 0637 07/27/19 1649 07/28/19 0608  AST 421* 619* 1,316* 2,582* 5,857*  ALT 239* 328* 751* 1,692* 3,342*  ALKPHOS 54 61 52 56 54  BILITOT 2.2* 2.4* 2.7* 2.6* 1.9*  PROT 6.2* 6.0* 5.4* 5.1* 5.0*  ALBUMIN 3.3* 3.0* 2.7* 2.6* 2.4*   No results for input(s): LIPASE, AMYLASE in the last 168 hours. Recent Labs  Lab 07/26/19 1500  AMMONIA 16   Coagulation Profile: Recent Labs  Lab 07/26/19 1008 07/26/19 2009 07/27/19 09/24/19 07/27/19 1649 07/28/19 0608  INR 1.4* 1.9*  2.1* 2.7* 3.9*   Cardiac Enzymes: Recent Labs  Lab 07/26/19 1730  CKTOTAL 125   BNP (last 3 results) No results for input(s): PROBNP in the last 8760 hours. HbA1C: No results for input(s): HGBA1C in the last 72 hours. CBG: No results for input(s): GLUCAP in the last 168 hours. Lipid Profile: No results for input(s): CHOL, HDL, LDLCALC, TRIG, CHOLHDL, LDLDIRECT in the last 72 hours. Thyroid Function Tests: No results for input(s): TSH, T4TOTAL, FREET4, T3FREE, THYROIDAB in the last 72 hours. Anemia Panel: Recent Labs    07/27/19 0637  VITAMINB12 853   Sepsis Labs: Recent Labs  Lab 07/26/19 1600 07/26/19 2009  LATICACIDVEN 0.8 1.5    Recent Results (from the past 240 hour(s))  Respiratory Panel by RT PCR (Flu A&B, Covid) - Nasopharyngeal Swab     Status: None   Collection Time: 07/26/19 12:00 PM   Specimen: Nasopharyngeal Swab  Result Value Ref Range Status   SARS Coronavirus 2 by RT PCR NEGATIVE NEGATIVE Final    Comment: (NOTE) SARS-CoV-2 target nucleic acids are NOT DETECTED. The SARS-CoV-2 RNA is generally detectable in upper respiratoy specimens during the acute phase of infection. The lowest concentration of SARS-CoV-2 viral copies this assay can detect is 131 copies/mL. A negative result does not preclude SARS-Cov-2 infection and should not be used as the sole basis for treatment or other patient management decisions. A negative result may occur with  improper specimen  collection/handling, submission of specimen other than nasopharyngeal swab, presence of viral mutation(s) within the areas targeted by this assay, and inadequate number of viral copies (<131 copies/mL). A negative result must be combined with clinical observations, patient history, and epidemiological information. The expected result is Negative. Fact Sheet for Patients:  PinkCheek.be Fact Sheet for Healthcare Providers:  GravelBags.it This test is not yet ap proved or cleared by the [place] FDA and  has been authorized for detection and/or diagnosis of SARS-CoV-2 by FDA under an Emergency Use Authorization (EUA). This EUA will remain  in effect (meaning this test can be used) for the duration of the COVID-19 declaration under Section 564(b)(1) of the Act, 21 U.S.C. section 360bbb-3(b)(1), unless the authorization is terminated or revoked sooner.    Influenza A by PCR NEGATIVE NEGATIVE Final   Influenza B by PCR NEGATIVE NEGATIVE Final    Comment: (NOTE) The Xpert Xpress SARS-CoV-2/FLU/RSV assay is intended as an aid in  the diagnosis of influenza from Nasopharyngeal swab specimens and  should not be used as a sole basis for treatment. Nasal washings and  aspirates are unacceptable for Xpert Xpress SARS-CoV-2/FLU/RSV  testing. Fact Sheet for Patients: PinkCheek.be Fact Sheet for Healthcare Providers: GravelBags.it This test is not yet approved or cleared by the [place] FDA and  has been authorized for detection and/or diagnosis of SARS-CoV-2 by  FDA under an Emergency Use Authorization (EUA). This EUA will remain  in effect (meaning this test can be used) for the duration of the  Covid-19 declaration under Section 564(b)(1) of the Act, 21  U.S.C. section 360bbb-3(b)(1), unless the authorization is  terminated or revoked. Performed at [facility] [place] [address], [place], [place] [address]   MRSA PCR Screening     Status: None   Collection Time: 07/26/19  9:28 PM   Specimen: Nasopharyngeal  Result Value Ref Range Status   MRSA by PCR NEGATIVE NEGATIVE Final    Comment:        The GeneXpert MRSA Assay (FDA approved  for NASAL specimens only), is one component of a comprehensive MRSA colonization surveillance program. It is not intended to diagnose MRSA infection nor to guide or monitor treatment for MRSA infections. Performed at [facility], [address] [place], [place] [address]          Radiology Studies: DG Chest Port 1 View  Result Date: 07/26/2019 CLINICAL DATA:  Intentional drug overdose. EXAM: PORTABLE CHEST 1 VIEW COMPARISON:  June 20, 2019. FINDINGS: The heart size and mediastinal contours are within normal limits. Both lungs are clear. The visualized skeletal structures are unremarkable. IMPRESSION: No active disease. Electronically Signed   By: Lupita Raider M.D.   On: 07/26/2019 12:44   US Abdomen Limited RUQ  Result Date: 07/26/2019 CLINICAL DATA:  LFT elevation EXAM: ULTRASOUND ABDOMEN LIMITED RIGHT UPPER QUADRANT COMPARISON:  None. FINDINGS: Gallbladder: No gallstones or wall thickening visualized. No sonographic Murphy sign noted by sonographer. Common bile duct: Diameter: 5 mm Liver: Increased echotexture seen throughout. No focal abnormality or biliary ductal dilatation. Portal vein is patent on color Doppler imaging with normal direction of blood flow towards the liver. Other: None. IMPRESSION: Mild hepatic steatosis.  Normal appearing gallbladder. Electronically Signed   By: Jonna Clark M.D.   On: 07/26/2019 19:02        Scheduled Meds: . sodium chloride   Intravenous Once  . sodium chloride   Intravenous Once  . sodium chloride   Intravenous Once  . ondansetron (ZOFRAN) IV  4 mg Intravenous Once  . pantoprazole (PROTONIX) IV  40 mg Intravenous Q12H  . phytonadione  5 mg Oral Once   . scopolamine  1 patch Transdermal Q72H   Continuous Infusions: . sodium chloride 150 mL/hr at 07/26/19 1322  . acetylcysteine 15 mg/kg/hr (07/28/19 0502)  . magnesium sulfate bolus IVPB    . potassium chloride       LOS: 2 days     Alwyn Ren, MD [facility]  If 7PM-7AM, please contact night-coverage [email or web address] Password [facility] 07/28/2019, 10:47 AM

## 2019-07-28 NOTE — Progress Notes (Signed)
Discussed with [facility]ison Control  Continue N-acetylcysteine until INR is < 2 and LFTs are trending downward.

## 2019-07-28 NOTE — Consult Note (Signed)
Reason for Consult: Acetaminophen and NSAID overdose Referring Physician: [facility]  Emilio Math HPI: This is a 54 year old male with a PMH of ETOH abuse, Bipolar 1 disorder, and Schizoaffective disorder admitted for acetaminophen and NSAID overdose.  The patient reported having issues with depression and he attempted suicide on 07/26/2019.  The report is that he took approximately 20,000 grams of ibuprofen and 3250 grams of acetaminophen.  In the ER his acetaminophen level was low as well as his salicylate, but there was a noted increase in his liver enzymes.  He had normal values on 06/20/2019.  Over the course of his hospitalization his liver enzymes continued to increase and now his AST is at 5,857 and ALT is at 3,342.  There is also an increase in his INR to 3.9.  Currently the patient feels well and is alert and oriented x 3.  On a daily basis for the past month, at least, he states that he drinks a 12 pack of beer.  He states that he drinks heavily over the past four years.  Past Medical History:  Diagnosis Date  . Coronary artery disease   . MI, old   . Tick fever     Past Surgical History:  Procedure Laterality Date  . HERNIA REPAIR    . KNEE SURGERY      History reviewed. No pertinent family history.  Social History:  reports that he has been smoking cigars. He has never used smokeless tobacco. He reports current alcohol use. He reports current drug use. Drug: Marijuana.  Allergies: No Known Allergies  Medications:  Scheduled: . sodium chloride   Intravenous Once  . sodium chloride   Intravenous Once  . sodium chloride   Intravenous Once  . ondansetron (ZOFRAN) IV  4 mg Intravenous Once  . pantoprazole (PROTONIX) IV  40 mg Intravenous Q12H  . scopolamine  1 patch Transdermal Q72H   Continuous: . sodium chloride 150 mL/hr at 07/26/19 1322  . acetylcysteine 15 mg/kg/hr (07/28/19 0502)    Results for orders placed or performed during the hospital encounter  of 07/26/19 (from the past 24 hour(s))  Comprehensive metabolic panel     Status: Abnormal   Collection Time: 07/27/19  4:49 PM  Result Value Ref Range   Sodium 135 135 - 145 mmol/L   Potassium 3.5 3.5 - 5.1 mmol/L   Chloride 106 98 - 111 mmol/L   CO2 19 (L) 22 - 32 mmol/L   Glucose, Bld 121 (H) 70 - 99 mg/dL   BUN 7 6 - 20 mg/dL   Creatinine, Ser 0.09 0.61 - 1.24 mg/dL   Calcium 8.3 (L) 8.9 - 10.3 mg/dL   Total Protein 5.1 (L) 6.5 - 8.1 g/dL   Albumin 2.6 (L) 3.5 - 5.0 g/dL   AST 3,818 (H) 15 - 41 U/L   ALT 1,692 (H) 0 - 44 U/L   Alkaline Phosphatase 56 38 - 126 U/L   Total Bilirubin 2.6 (H) 0.3 - 1.2 mg/dL   GFR calc non Af Amer >60 >60 mL/min   GFR calc Af Amer >60 >60 mL/min   Anion gap 10 5 - 15  CBC with Differential/Platelet     Status: Abnormal   Collection Time: 07/27/19  4:49 PM  Result Value Ref Range   WBC 2.8 (L) 4.0 - 10.5 K/uL   RBC 3.76 (L) 4.22 - 5.81 MIL/uL   Hemoglobin 11.6 (L) 13.0 - 17.0 g/dL   HCT 29.9 (L) 37.1 - 69.6 %  MCV 88.8 80.0 - 100.0 fL   MCH 30.9 26.0 - 34.0 pg   MCHC 34.7 30.0 - 36.0 g/dL   RDW 62.9 52.8 - 41.3 %   Platelets 6 (LL) 150 - 400 K/uL   nRBC 0.0 0.0 - 0.2 %   Neutrophils Relative % 68 %   Neutro Abs 1.9 1.7 - 7.7 K/uL   Lymphocytes Relative 25 %   Lymphs Abs 0.7 0.7 - 4.0 K/uL   Monocytes Relative 4 %   Monocytes Absolute 0.1 0.1 - 1.0 K/uL   Eosinophils Relative 3 %   Eosinophils Absolute 0.1 0.0 - 0.5 K/uL   Basophils Relative 0 %   Basophils Absolute 0.0 0.0 - 0.1 K/uL   Immature Granulocytes 0 %   Abs Immature Granulocytes 0.01 0.00 - 0.07 K/uL  Protime-INR     Status: Abnormal   Collection Time: 07/27/19  4:49 PM  Result Value Ref Range   Prothrombin Time 28.3 (H) 11.4 - 15.2 seconds   INR 2.7 (H) 0.8 - 1.2  Comprehensive metabolic panel     Status: Abnormal   Collection Time: 07/28/19  6:08 AM  Result Value Ref Range   Sodium 138 135 - 145 mmol/L   Potassium 3.5 3.5 - 5.1 mmol/L   Chloride 109 98 - 111 mmol/L    CO2 20 (L) 22 - 32 mmol/L   Glucose, Bld 82 70 - 99 mg/dL   BUN <5 (L) 6 - 20 mg/dL   Creatinine, Ser 2.44 0.61 - 1.24 mg/dL   Calcium 8.0 (L) 8.9 - 10.3 mg/dL   Total Protein 5.0 (L) 6.5 - 8.1 g/dL   Albumin 2.4 (L) 3.5 - 5.0 g/dL   AST 0,102 (H) 15 - 41 U/L   ALT 3,342 (H) 0 - 44 U/L   Alkaline Phosphatase 54 38 - 126 U/L   Total Bilirubin 1.9 (H) 0.3 - 1.2 mg/dL   GFR calc non Af Amer >60 >60 mL/min   GFR calc Af Amer >60 >60 mL/min   Anion gap 9 5 - 15  CBC with Differential/Platelet     Status: Abnormal   Collection Time: 07/28/19  6:08 AM  Result Value Ref Range   WBC 2.6 (L) 4.0 - 10.5 K/uL   RBC 3.61 (L) 4.22 - 5.81 MIL/uL   Hemoglobin 11.2 (L) 13.0 - 17.0 g/dL   HCT 72.5 (L) 36.6 - 44.0 %   MCV 90.6 80.0 - 100.0 fL   MCH 31.0 26.0 - 34.0 pg   MCHC 34.3 30.0 - 36.0 g/dL   RDW 34.7 (H) 42.5 - 95.6 %   Platelets 6 (LL) 150 - 400 K/uL   nRBC 0.0 0.0 - 0.2 %   Neutrophils Relative % 66 %   Neutro Abs 1.7 1.7 - 7.7 K/uL   Lymphocytes Relative 24 %   Lymphs Abs 0.6 (L) 0.7 - 4.0 K/uL   Monocytes Relative 5 %   Monocytes Absolute 0.1 0.1 - 1.0 K/uL   Eosinophils Relative 3 %   Eosinophils Absolute 0.1 0.0 - 0.5 K/uL   Basophils Relative 1 %   Basophils Absolute 0.0 0.0 - 0.1 K/uL   Immature Granulocytes 1 %   Abs Immature Granulocytes 0.02 0.00 - 0.07 K/uL  Protime-INR     Status: Abnormal   Collection Time: 07/28/19  6:08 AM  Result Value Ref Range   Prothrombin Time 37.9 (H) 11.4 - 15.2 seconds   INR 3.9 (H) 0.8 - 1.2  Magnesium  Status: None   Collection Time: 07/28/19  6:08 AM  Result Value Ref Range   Magnesium 1.8 1.7 - 2.4 mg/dL  Prepare Pheresed Platelets     Status: None (Preliminary result)   Collection Time: 07/28/19  7:30 AM  Result Value Ref Range   Unit Number [id number]    Blood Component Type PLTP LR1 PAS    Unit division 00    Status of Unit ISSUED    Transfusion Status      OK TO TRANSFUSE Performed at [facility], [place] [address], [place], [place] [address]   Prepare fresh frozen plasma     Status: None (Preliminary result)   Collection Time: 07/28/19  7:30 AM  Result Value Ref Range   Unit Number [id number]    Blood Component Type THAWED PLASMA    Unit division 00    Status of Unit ISSUED    Transfusion Status      OK TO TRANSFUSE Performed at [facility] [address] [place], [place] [address]    Unit Number [id number]    Blood Component Type THW PLS APHR    Unit division 00    Status of Unit ALLOCATED    Transfusion Status OK TO TRANSFUSE      DG Chest Port 1 View  Result Date: 07/26/2019 CLINICAL DATA:  Intentional drug overdose. EXAM: PORTABLE CHEST 1 VIEW COMPARISON:  June 20, 2019. FINDINGS: The heart size and mediastinal contours are within normal limits. Both lungs are clear. The visualized skeletal structures are unremarkable. IMPRESSION: No active disease. Electronically Signed   By: Marijo Conception M.D.   On: 07/26/2019 12:44   US Abdomen Limited RUQ  Result Date: 07/26/2019 CLINICAL DATA:  LFT elevation EXAM: ULTRASOUND ABDOMEN LIMITED RIGHT UPPER QUADRANT COMPARISON:  None. FINDINGS: Gallbladder: No gallstones or wall thickening visualized. No sonographic Murphy sign noted by sonographer. Common bile duct: Diameter: 5 mm Liver: Increased echotexture seen throughout. No focal abnormality or biliary ductal dilatation. Portal vein is patent on color Doppler imaging with normal direction of blood flow towards the liver. Other: None. IMPRESSION: Mild hepatic steatosis.  Normal appearing gallbladder. Electronically Signed   By: Prudencio Pair M.D.   On: 07/26/2019 19:02    ROS:  As stated above in the HPI otherwise negative.  Blood pressure (!) 135/55, pulse (!) 55, temperature 98.8 F (37.1 C), temperature source Oral, resp. rate (!) 21, height 6\' 3"  (1.905 m), weight 69.7 kg, SpO2 100 %.    PE: Gen: NAD, Alert and Oriented HEENT:  [place]/AT, EOMI Neck: Supple, no LAD Lungs:  CTA Bilaterally CV: RRR without M/G/R ABM: Soft, NTND, +BS Ext: No C/C/E, no asterixis.  Assessment/Plan: 1) Acute hepatitis. 2) Suicide attempt. 3) Coagulopathy.   The patient does not exhibit any hepatic encephalopathy.  He feels well at this time, but his liver enzymes continue to increase as well as his INR.  It is likely that he has not peaked with his liver enzymes or this may be the peak.  If he develops hepatic encephalopathy, a transplant center will need to be contacted.  It is unlikely that he will be a transplant candidate with his history, but it will be worthwhile to discuss this issue with a transplant center.  Currently he is stable.  The amount of acetaminophen ingested typically does not cause hepatic failure, but he does have a significant baseline level of ETOH abuse.  This can lower the threshold for liver decompensation.  Additionally, he also took a large amount of ibuprofen.  Plan: 1) Daily and frequent checks for hepatic encephalopathy. 2) Follow INR and liver panel. 3) Continue with supportive care.  Maurya Nethery D 07/28/2019, 12:35 PM

## 2019-07-29 LAB — CBC WITH DIFFERENTIAL/PLATELET
Abs Immature Granulocytes: 0.02 10*3/uL (ref 0.00–0.07)
Basophils Absolute: 0 10*3/uL (ref 0.0–0.1)
Basophils Relative: 1 %
Eosinophils Absolute: 0.2 10*3/uL (ref 0.0–0.5)
Eosinophils Relative: 9 %
HCT: 28.7 % — ABNORMAL LOW (ref 39.0–52.0)
Hemoglobin: 10.1 g/dL — ABNORMAL LOW (ref 13.0–17.0)
Immature Granulocytes: 1 %
Lymphocytes Relative: 27 %
Lymphs Abs: 0.7 10*3/uL (ref 0.7–4.0)
MCH: 31.1 pg (ref 26.0–34.0)
MCHC: 35.2 g/dL (ref 30.0–36.0)
MCV: 88.3 fL (ref 80.0–100.0)
Monocytes Absolute: 0.1 10*3/uL (ref 0.1–1.0)
Monocytes Relative: 5 %
Neutro Abs: 1.6 10*3/uL — ABNORMAL LOW (ref 1.7–7.7)
Neutrophils Relative %: 57 %
Platelets: 28 10*3/uL — CL (ref 150–400)
RBC: 3.25 MIL/uL — ABNORMAL LOW (ref 4.22–5.81)
RDW: 15.2 % (ref 11.5–15.5)
WBC: 2.8 10*3/uL — ABNORMAL LOW (ref 4.0–10.5)
nRBC: 0 % (ref 0.0–0.2)

## 2019-07-29 LAB — BPAM FFP
Blood Product Expiration Date: 202102042359
Blood Product Expiration Date: 202102042359
ISSUE DATE / TIME: 202102041223
ISSUE DATE / TIME: 202102041521
Unit Type and Rh: 6200
Unit Type and Rh: 6200

## 2019-07-29 LAB — COMPREHENSIVE METABOLIC PANEL
ALT: 3301 U/L — ABNORMAL HIGH (ref 0–44)
AST: 3065 U/L — ABNORMAL HIGH (ref 15–41)
Albumin: 2.5 g/dL — ABNORMAL LOW (ref 3.5–5.0)
Alkaline Phosphatase: 74 U/L (ref 38–126)
Anion gap: 8 (ref 5–15)
BUN: <5 mg/dL — ABNORMAL LOW (ref 6–20)
CO2: 21 mmol/L — ABNORMAL LOW (ref 22–32)
Calcium: 8.5 mg/dL — ABNORMAL LOW (ref 8.9–10.3)
Chloride: 107 mmol/L (ref 98–111)
Creatinine, Ser: 1.02 mg/dL (ref 0.61–1.24)
GFR calc Af Amer: >60 mL/min (ref >60)
GFR calc non Af Amer: >60 mL/min (ref >60)
Glucose, Bld: 103 mg/dL — ABNORMAL HIGH (ref 70–99)
Potassium: 3.2 mmol/L — ABNORMAL LOW (ref 3.5–5.1)
Sodium: 136 mmol/L (ref 135–145)
Total Bilirubin: 2.3 mg/dL — ABNORMAL HIGH (ref 0.3–1.2)
Total Protein: 5 g/dL — ABNORMAL LOW (ref 6.5–8.1)

## 2019-07-29 LAB — PREPARE PLATELET PHERESIS: Unit division: 0

## 2019-07-29 LAB — PREPARE FRESH FROZEN PLASMA
Unit division: 0
Unit division: 0

## 2019-07-29 LAB — FIBRINOGEN: Fibrinogen: 212 mg/dL (ref 210–475)

## 2019-07-29 LAB — BPAM PLATELET PHERESIS
Blood Product Expiration Date: 202102062359
ISSUE DATE / TIME: 202102040917
Unit Type and Rh: 6200

## 2019-07-29 LAB — PROTIME-INR
INR: 2.3 — ABNORMAL HIGH (ref 0.8–1.2)
Prothrombin Time: 25.1 seconds — ABNORMAL HIGH (ref 11.4–15.2)

## 2019-07-29 NOTE — Progress Notes (Signed)
Updated Denise @Poison  Control w/ pt vitals & labs.

## 2019-07-29 NOTE — Progress Notes (Signed)
Patient is not seen. His labs are reviewed. He does not need transfusion today. I recommend: 1) if platelet count dropped to less than 10,000, please transfuse 1 unit of platelets 2) if INR is greater than 2.5, transfuse 2 units of fresh frozen plasma  Please contact hematologist on call over the weekend if questions arise

## 2019-07-29 NOTE — Progress Notes (Signed)
Subjective: No complaints.  He had some epistaxis.  Objective: Vital signs in last 24 hours: Temp:  [97.9 F (36.6 C)-98.8 F (37.1 C)] 98.4 F (36.9 C) (02/04 2310) Pulse Rate:  [55-75] 59 (02/05 0357) Resp:  [13-25] 20 (02/05 0357) BP: (126-154)/(44-79) 141/70 (02/05 0357) SpO2:  [94 %-100 %] 94 % (02/05 0357)    Intake/Output from previous day: 02/04 0701 - 02/05 0700 In: 6766.9 [P.O.:600; I.V.:4485.1; Blood:1415; IV Piggyback:266.8] Out: 2400 [Urine:2400] Intake/Output this shift: Total I/O In: -  Out: 300 [Urine:300]  General appearance: alert and no distress Extremities: no asterixis  Lab Results: Recent Labs    07/28/19 0608 07/28/19 1646 07/29/19 0245  WBC 2.6* 3.1* 2.8*  HGB 11.2* 10.8* 10.1*  HCT 32.7* 30.5* 28.7*  PLT 6* 22* 28*   BMET Recent Labs    07/27/19 1649 07/28/19 0608 07/28/19 1646  NA 135 138 136  K 3.5 3.5 3.6  CL 106 109 106  CO2 19* 20* 21*  GLUCOSE 121* 82 114*  BUN 7 <5* <5*  CREATININE 1.21 1.07 1.08  CALCIUM 8.3* 8.0* 8.4*   LFT Recent Labs    07/28/19 1646  PROT 5.4*  ALBUMIN 2.7*  AST 9,140*  ALT 5,567*  ALKPHOS 65  BILITOT 2.0*   PT/INR Recent Labs    07/28/19 1646 07/29/19 0245  LABPROT 27.5* 25.1*  INR 2.6* 2.3*   Hepatitis Panel Recent Labs    07/27/19 0637  HEPBSAG NON REACTIVE  HCVAB NON REACTIVE  HEPAIGM NON REACTIVE  HEPBIGM NON REACTIVE   C-Diff No results for input(s): CDIFFTOX in the last 72 hours. Fecal Lactopherrin No results for input(s): FECLLACTOFRN in the last 72 hours.  Studies/Results: No results found.  Medications:  Scheduled: . sodium chloride   Intravenous Once  . sodium chloride   Intravenous Once  . sodium chloride   Intravenous Once  . ondansetron (ZOFRAN) IV  4 mg Intravenous Once  . pantoprazole (PROTONIX) IV  40 mg Intravenous Q12H  . scopolamine  1 patch Transdermal Q72H   Continuous: . sodium chloride 150 mL/hr at 07/26/19 1322  . acetylcysteine 15 mg/kg/hr  (07/29/19 0428)    Assessment/Plan: 1) Hepatic failure - improving. 2) Suicide attempt. 3) ETOH abuse. 4) Coagulopathy. 5) Thrombocytopenia.   The patient's coagulopathy is improving, but he was also provided with FFP and vit K.  He had some epistaxis and it was prolonged with his elevated INR and thrombocytopenia, but the bleeding has stopped.  There is no evidence of hepatic encephalopathy.  His liver enzymes have worsened, but his synthetic function is improving.  Plan: 1) Continue to monitor for hepatic encephalopathy. 2) Follow INR. 3) Continue with supportive care. 4) Continue with NAC protocol.  LOS: 3 days   Olympia Adelsberger D 07/29/2019, 8:02 AM

## 2019-07-29 NOTE — Progress Notes (Signed)
PROGRESS NOTE    Dean Howard  XLK:[id number] DOB: 31-Jul-1965 DOA: 07/26/2019 PCP: Patient, No Pcp Per  Brief Narrative:  54 year old male admitted with intentional overdose. He has significant history of depression, schizophrenia, bipolar disorder, MI in 2014 previous suicidal attempts and CAD. He overdosed in 2020 on aspirin. This time he presented with overdose of Tylenol and ibuprofen. He took unknown number of 200 mg ibuprofen and 10 pills of 325 mg of Tylenol. Prior to coming into the hospital he took another 20 tablets of 200 mg of ibuprofen and started to have sharp abdominal pain. Patient reports feeling depressed due to lack of health insurance and lack of getting his disability hearing postponed to April 2021. He drinks 18-24 beers per day and continues to smoke marijuana daily for the last 4 years. He lives at home with his sister and his mother. He was found to have severe thrombocytopenia. Lately count less than 5000.  Assessment & Plan:   Principal Problem:   Overdose by acetaminophen Active Problems:   Tylenol overdose   Overdose   LFT elevation   Acquired coagulation disorder (HCC)   #1 intentional overdose on acetaminophen and ibuprofen in an intent to kill himself from severe depression. He reports his abdominal pain is better he is able to tolerate some p.o. intake. Continue with scopolamine patch and IV fluids. Labs CMP pending for today. Poison control recommends keeping his electrolytes normal. Continue Mucomyst till his INR is less than 2 and LFTs are trending down. He received vitamin K FFP and platelets 07/28/2019. Urine drug screen positive for THC alcohol level less than 10 salicylate level less than 7 EKG today and daily QTC 443 down from 463 down from 510  #2  Pancytopenia /severe acute thrombocytopenia/elevated INR- likely secondary to alcohol use appreciate input from hematology.  Platelets up to 28,000 today INR 2.3 improving Transfuse  to keep platelets about 10,000 Check INR daily.  Check CBC daily.   #3 multiple suicidal attempts psych consult appreciated. They recommend inpatient psychiatric admission when he is medically stable.  #4 CKD stage I creatinine 1.07 continue IV fluids.    #5 acute liver toxicity secondary to #1-unfortunately his LFTs are trending up.   AST is 5857 from 1316 from 619 ALT is 3342 from 751 from 328 Total bilirubin 1.9 improved, INR 3.9 worsening. Consulted GI [name]. Continue N-acetylcysteine till INR less than 2 and LFTs start to trend down per poison control.  #6 alcohol abuse watch for withdrawals  #7 possible gastritis from overdose and alcohol use-continue Protonix.    Estimated body mass index is 19.21 kg/m as calculated from the following:   Height as of this encounter: 6\' 3"  (1.905 m).   Weight as of this encounter: 69.7 kg. DVT prophylaxis:SCD Code Status:Full code Family Communication:d/w mother and sister  Disposition Plan:Once medically stable he will be discharged to inpatient psych. Patient came from home. Barriers to discharge is patient with liver toxicity from intentional overdose on IV N-acetylcysteine symptomatic treatments close monitoring of electrolytes and EKG for QT prolongation.  Consultants:Psychiatry gi   Procedures:None Antimicrobials:None  Subjective:  Awake alert resting in bed had some minor bleeding from his nose today which resolved on its own. Objective: Vitals:   07/28/19 2310 07/29/19 0357 07/29/19 1000 07/29/19 1118  BP: 135/71 (!) 141/70 134/75 (!) 153/62  Pulse: (!) 57 (!) 59 60 64  Resp: 15 20 17 20   Temp: 98.4 F (36.9 C)     TempSrc: Oral  SpO2: 97% 94% 98% 97%  Weight:      Height:        Intake/Output Summary (Last 24 hours) at 07/29/2019 1552 Last data filed at 07/29/2019 1428 Gross per 24 hour  Intake 5674.92 ml  Output 2100 ml  Net 3574.92 ml   Filed Weights   07/26/19 1130 07/26/19 2140    Weight: 77.1 kg 69.7 kg    Examination:  General exam: Appears calm and comfortable  Respiratory system: Clear to auscultation. Respiratory effort normal. Cardiovascular system: S1 & S2 heard, RRR. No JVD, murmurs, rubs, gallops or clicks. No pedal edema. Gastrointestinal system: Abdomen is nondistended, soft and nontender. No organomegaly or masses felt. Normal bowel sounds heard. Central nervous system: Alert and oriented. No focal neurological deficits. Extremities: Symmetric 5 x 5 power. Skin: No rashes, lesions or ulcers Psychiatry: Judgement and insight appear normal. Mood & affect appropriate.     Data Reviewed: I have personally reviewed following labs and imaging studies  CBC: Recent Labs  Lab 07/27/19 0637 07/27/19 1649 07/28/19 0608 07/28/19 1646 07/29/19 0245  WBC 2.9* 2.8* 2.6* 3.1* 2.8*  NEUTROABS 1.7 1.9 1.7 2.2 1.6*  HGB 11.5* 11.6* 11.2* 10.8* 10.1*  HCT 33.5* 33.4* 32.7* 30.5* 28.7*  MCV 90.3 88.8 90.6 88.4 88.3  PLT <5* 6* 6* 22* 28*   Basic Metabolic Panel: Recent Labs  Lab 07/26/19 1008 07/26/19 1500 07/26/19 1730 07/26/19 2009 07/27/19 0637 07/27/19 1649 07/28/19 0608 07/28/19 1646  NA 137   < >  --  138 137 135 138 136  K 3.8   < >  --  3.7 3.5 3.5 3.5 3.6  CL 103   < >  --  106 107 106 109 106  CO2 24   < >  --  21* 19* 19* 20* 21*  GLUCOSE 112*   < >  --  102* 77 121* 82 114*  BUN 15   < >  --  11 9 7  <5* <5*  CREATININE 1.48*   < >  --  1.32* 1.27* 1.21 1.07 1.08  CALCIUM 9.2   < >  --  8.4* 8.0* 8.3* 8.0* 8.4*  MG 2.1  --  2.2  --   --   --  1.8  --    < > = values in this interval not displayed.   GFR: Estimated Creatinine Clearance: 78.9 mL/min (by C-G formula based on SCr of 1.08 mg/dL). Liver Function Tests: Recent Labs  Lab 07/26/19 2009 07/27/19 09/24/19 07/27/19 1649 07/28/19 0608 07/28/19 1646  AST 619* 1,316* 2,582* 5,857* 9,140*  ALT 328* 751* 1,692* 3,342* 5,567*  ALKPHOS 61 52 56 54 65  BILITOT 2.4* 2.7* 2.6*  1.9* 2.0*  PROT 6.0* 5.4* 5.1* 5.0* 5.4*  ALBUMIN 3.0* 2.7* 2.6* 2.4* 2.7*   No results for input(s): LIPASE, AMYLASE in the last 168 hours. Recent Labs  Lab 07/26/19 1500  AMMONIA 16   Coagulation Profile: Recent Labs  Lab 07/27/19 0637 07/27/19 1649 07/28/19 0608 07/28/19 1646 07/29/19 0245  INR 2.1* 2.7* 3.9* 2.6* 2.3*   Cardiac Enzymes: Recent Labs  Lab 07/26/19 1730  CKTOTAL 125   BNP (last 3 results) No results for input(s): PROBNP in the last 8760 hours. HbA1C: No results for input(s): HGBA1C in the last 72 hours. CBG: No results for input(s): GLUCAP in the last 168 hours. Lipid Profile: No results for input(s): CHOL, HDL, LDLCALC, TRIG, CHOLHDL, LDLDIRECT in the last 72 hours. Thyroid Function Tests: No results  for input(s): TSH, T4TOTAL, FREET4, T3FREE, THYROIDAB in the last 72 hours. Anemia Panel: Recent Labs    07/27/19 0637  VITAMINB12 853   Sepsis Labs: Recent Labs  Lab 07/26/19 1600 07/26/19 2009  LATICACIDVEN 0.8 1.5    Recent Results (from the past 240 hour(s))  Respiratory Panel by RT PCR (Flu A&B, Covid) - Nasopharyngeal Swab     Status: None   Collection Time: 07/26/19 12:00 PM   Specimen: Nasopharyngeal Swab  Result Value Ref Range Status   SARS Coronavirus 2 by RT PCR NEGATIVE NEGATIVE Final    Comment: (NOTE) SARS-CoV-2 target nucleic acids are NOT DETECTED. The SARS-CoV-2 RNA is generally detectable in upper respiratoy specimens during the acute phase of infection. The lowest concentration of SARS-CoV-2 viral copies this assay can detect is 131 copies/mL. A negative result does not preclude SARS-Cov-2 infection and should not be used as the sole basis for treatment or other patient management decisions. A negative result may occur with  improper specimen collection/handling, submission of specimen other than nasopharyngeal swab, presence of viral mutation(s) within the areas targeted by this assay, and inadequate number of viral  copies (<131 copies/mL). A negative result must be combined with clinical observations, patient history, and epidemiological information. The expected result is Negative. Fact Sheet for Patients:  https://www.moore.com/ Fact Sheet for Healthcare Providers:  https://www.young.biz/ This test is not yet ap proved or cleared by the [place] FDA and  has been authorized for detection and/or diagnosis of SARS-CoV-2 by FDA under an Emergency Use Authorization (EUA). This EUA will remain  in effect (meaning this test can be used) for the duration of the COVID-19 declaration under Section 564(b)(1) of the Act, 21 U.S.C. section 360bbb-3(b)(1), unless the authorization is terminated or revoked sooner.    Influenza A by PCR NEGATIVE NEGATIVE Final   Influenza B by PCR NEGATIVE NEGATIVE Final    Comment: (NOTE) The Xpert Xpress SARS-CoV-2/FLU/RSV assay is intended as an aid in  the diagnosis of influenza from Nasopharyngeal swab specimens and  should not be used as a sole basis for treatment. Nasal washings and  aspirates are unacceptable for Xpert Xpress SARS-CoV-2/FLU/RSV  testing. Fact Sheet for Patients: https://www.moore.com/ Fact Sheet for Healthcare Providers: https://www.young.biz/ This test is not yet approved or cleared by the [place] FDA and  has been authorized for detection and/or diagnosis of SARS-CoV-2 by  FDA under an Emergency Use Authorization (EUA). This EUA will remain  in effect (meaning this test can be used) for the duration of the  Covid-19 declaration under Section 564(b)(1) of the Act, 21  U.S.C. section 360bbb-3(b)(1), unless the authorization is  terminated or revoked. Performed at [facility], [address], [place], [place] [address]   MRSA PCR Screening     Status: None   Collection Time: 07/26/19  9:28 PM   Specimen: Nasopharyngeal  Result Value Ref Range Status     MRSA by PCR NEGATIVE NEGATIVE Final    Comment:        The GeneXpert MRSA Assay (FDA approved for NASAL specimens only), is one component of a comprehensive MRSA colonization surveillance program. It is not intended to diagnose MRSA infection nor to guide or monitor treatment for MRSA infections. Performed at [facility], [address], [place], [place] [address]          Radiology Studies: No results found.      Scheduled Meds: . sodium chloride   Intravenous Once  . sodium  chloride   Intravenous Once  . sodium chloride   Intravenous Once  . ondansetron (ZOFRAN) IV  4 mg Intravenous Once  . pantoprazole (PROTONIX) IV  40 mg Intravenous Q12H  . scopolamine  1 patch Transdermal Q72H   Continuous Infusions: . sodium chloride 150 mL/hr at 07/26/19 1322  . acetylcysteine 15 mg/kg/hr (07/29/19 0428)     LOS: 3 days     Georgette Shell, MD 07/29/2019, 3:52 PM

## 2019-07-30 DIAGNOSIS — T391X2D Poisoning by 4-Aminophenol derivatives, intentional self-harm, subsequent encounter: Secondary | ICD-10-CM

## 2019-07-30 DIAGNOSIS — T50902D Poisoning by unspecified drugs, medicaments and biological substances, intentional self-harm, subsequent encounter: Secondary | ICD-10-CM

## 2019-07-30 LAB — COMPREHENSIVE METABOLIC PANEL
ALT: 2980 U/L — ABNORMAL HIGH (ref 0–44)
AST: 1463 U/L — ABNORMAL HIGH (ref 15–41)
Albumin: 2.6 g/dL — ABNORMAL LOW (ref 3.5–5.0)
Alkaline Phosphatase: 67 U/L (ref 38–126)
Anion gap: 9 (ref 5–15)
BUN: <5 mg/dL — ABNORMAL LOW (ref 6–20)
CO2: 20 mmol/L — ABNORMAL LOW (ref 22–32)
Calcium: 8.5 mg/dL — ABNORMAL LOW (ref 8.9–10.3)
Chloride: 107 mmol/L (ref 98–111)
Creatinine, Ser: 1.06 mg/dL (ref 0.61–1.24)
GFR calc Af Amer: >60 mL/min (ref >60)
GFR calc non Af Amer: >60 mL/min (ref >60)
Glucose, Bld: 97 mg/dL (ref 70–99)
Potassium: 3 mmol/L — ABNORMAL LOW (ref 3.5–5.1)
Sodium: 136 mmol/L (ref 135–145)
Total Bilirubin: 2.1 mg/dL — ABNORMAL HIGH (ref 0.3–1.2)
Total Protein: 5.3 g/dL — ABNORMAL LOW (ref 6.5–8.1)

## 2019-07-30 LAB — CBC
HCT: 29.7 % — ABNORMAL LOW (ref 39.0–52.0)
Hemoglobin: 10.3 g/dL — ABNORMAL LOW (ref 13.0–17.0)
MCH: 30.8 pg (ref 26.0–34.0)
MCHC: 34.7 g/dL (ref 30.0–36.0)
MCV: 88.9 fL (ref 80.0–100.0)
Platelets: 58 10*3/uL — ABNORMAL LOW (ref 150–400)
RBC: 3.34 MIL/uL — ABNORMAL LOW (ref 4.22–5.81)
RDW: 15.3 % (ref 11.5–15.5)
WBC: 3.2 10*3/uL — ABNORMAL LOW (ref 4.0–10.5)
nRBC: 0 % (ref 0.0–0.2)

## 2019-07-30 LAB — PROTIME-INR
INR: 1.5 — ABNORMAL HIGH (ref 0.8–1.2)
Prothrombin Time: 17.7 seconds — ABNORMAL HIGH (ref 11.4–15.2)

## 2019-07-30 MED ORDER — POTASSIUM CHLORIDE CRYS ER 20 MEQ PO TBCR
40.0000 meq | EXTENDED_RELEASE_TABLET | Freq: Two times a day (BID) | ORAL | Status: AC
  Administered 2019-07-30 (×2): 40 meq via ORAL
  Filled 2019-07-30 (×2): qty 2

## 2019-07-30 NOTE — Progress Notes (Signed)
Pt dinner tray delivered

## 2019-07-30 NOTE — Progress Notes (Signed)
PROGRESS NOTE    JERMON CHALFANT  LGX:[id number] DOB: 1966-03-08 DOA: 07/26/2019 PCP: Patient, No Pcp Per  Brief Narrative:54 year old male admitted with intentional overdose. He has significant history of depression, schizophrenia, bipolar disorder, MI in 2014 previous suicidal attempts and CAD. He overdosed in 2020 on aspirin. This time he presented with overdose of Tylenol and ibuprofen. He took unknown number of 200 mg ibuprofen and 10 pills of 325 mg of Tylenol. Prior to coming into the hospital he took another 20 tablets of 200 mg of ibuprofen and started to have sharp abdominal pain. Patient reports feeling depressed due to lack of health insurance and lack of getting his disability hearing postponed to April 2021. He drinks 18-24 beers per day and continues to smoke marijuana daily for the last 4 years. He lives at home with his sister and his mother. He was found to have severe thrombocytopenia. Lately count less than 5000.  Assessment & Plan:   Principal Problem:   Overdose by acetaminophen Active Problems:   Tylenol overdose   Overdose   LFT elevation   Acquired coagulation disorder (HCC)   #1 intentional overdose on acetaminophen and ibuprofen in an intent to kill himself from severe depression.  He was treated with Mucomyst drip till 07/30/2019.  The drip is stopped today as his LFTs started trending down with INR less than 2.  Discussed with poison control.  Patient is able to eat and drink without much nausea or abdominal pain.  Continue scopolamine patch and IV fluids.  Follow-up labs daily. He received platelet transfusion vitamin K and FFP. Urine drug screen positive for THC alcohol level less than 10 salicylate level less than 7 EKG today and daily QTC 443 down from463 down from510  #2Pancytopenia/severe acute thrombocytopenia/elevated INR- likely secondary to alcohol use appreciate input from hematology.  Platelets up to 58,000 from 28,000 yesterday.  INR  is 2.1 down from 2.3 yesterday. Check INR daily.  Check CBC daily.   #3 multiple suicidal attempts psych consult appreciated. They recommend inpatient psychiatric admission when he is medically stable.  #4 CKD stage I creatinine1.07 continue IV fluids.  #5 acute liver toxicity secondary to #1-fortunately his LFTs are trending down. Unfortunately his LFTs are trending up. ASTis 1463 down from 5857 from1316 from 619 ALTis 2980 down from 3342 from751 from 328 Total bilirubin 2.1  consulted GI [name].  #6 alcohol abuse watch for withdrawals  #7 possible gastritis from overdose and alcohol use-continue Protonix.   Estimated body mass index is 19.21 kg/m as calculated from the following:   Height as of this encounter: 6\' 3"  (1.905 m).   Weight as of this encounter: 69.7 kg.  DVT prophylaxis:SCD Code Status:Full code Family Communication:d/w mother and sister  Disposition Plan:Once medically stable he will be discharged to inpatient psych. Patient came from home. Barriers to discharge is patient with liver toxicity from intentional overdose needs close monitoring of electrolytes and EKG for QT prolongation. He likely will be ready for discharge to inpatient psych on Monday. Consultants:Psychiatrygi   Procedures:None Antimicrobials:None  Subjective: Resting in bed awake alert sitter by the bedside no nausea vomiting Objective: Vitals:   07/29/19 2039 07/30/19 0603 07/30/19 0818 07/30/19 1210  BP: (!) 158/83 (!) 161/74 (!) 144/78   Pulse: 72 65 69   Resp: 16 18 15    Temp: 98.2 F (36.8 C) 98.3 F (36.8 C) 99.5 F (37.5 C) 99.1 F (37.3 C)  TempSrc: Oral Oral Oral Oral  SpO2: 97% 95% 99%  100%  Weight:      Height:        Intake/Output Summary (Last 24 hours) at 07/30/2019 1427 Last data filed at 07/30/2019 1422 Gross per 24 hour  Intake 1380 ml  Output 5025 ml  Net -3645 ml   Filed Weights   07/26/19 1130 07/26/19 2140  Weight: 77.1 kg  69.7 kg    Examination:  General exam: Appears calm and comfortable  Respiratory system: Clear to auscultation. Respiratory effort normal. Cardiovascular system: S1 & S2 heard, RRR. No JVD, murmurs, rubs, gallops or clicks. No pedal edema. Gastrointestinal system: Abdomen is nondistended, soft and nontender. No organomegaly or masses felt. Normal bowel sounds heard. Central nervous system: Alert and oriented. No focal neurological deficits. Extremities: Symmetric 5 x 5 power. Skin: No rashes, lesions or ulcers Psychiatry: Judgement and insight appear normal. Mood & affect appropriate.     Data Reviewed: I have personally reviewed following labs and imaging studies  CBC: Recent Labs  Lab 07/27/19 0637 07/27/19 0637 07/27/19 1649 07/28/19 0608 07/28/19 1646 07/29/19 0245 07/30/19 0323  WBC 2.9*   < > 2.8* 2.6* 3.1* 2.8* 3.2*  NEUTROABS 1.7  --  1.9 1.7 2.2 1.6*  --   HGB 11.5*   < > 11.6* 11.2* 10.8* 10.1* 10.3*  HCT 33.5*   < > 33.4* 32.7* 30.5* 28.7* 29.7*  MCV 90.3   < > 88.8 90.6 88.4 88.3 88.9  PLT <5*   < > 6* 6* 22* 28* 58*   < > = values in this interval not displayed.   Basic Metabolic Panel: Recent Labs  Lab 07/26/19 1008 07/26/19 1500 07/26/19 1730 07/26/19 2009 07/27/19 1649 07/28/19 0608 07/28/19 1646 07/29/19 1623 07/30/19 0323  NA 137   < >  --    < > 135 138 136 136 136  K 3.8   < >  --    < > 3.5 3.5 3.6 3.2* 3.0*  CL 103   < >  --    < > 106 109 106 107 107  CO2 24   < >  --    < > 19* 20* 21* 21* 20*  GLUCOSE 112*   < >  --    < > 121* 82 114* 103* 97  BUN 15   < >  --    < > 7 <5* <5* <5* <5*  CREATININE 1.48*   < >  --    < > 1.21 1.07 1.08 1.02 1.06  CALCIUM 9.2   < >  --    < > 8.3* 8.0* 8.4* 8.5* 8.5*  MG 2.1  --  2.2  --   --  1.8  --   --   --    < > = values in this interval not displayed.   GFR: Estimated Creatinine Clearance: 80.4 mL/min (by C-G formula based on SCr of 1.06 mg/dL). Liver Function Tests: Recent Labs  Lab  07/27/19 1649 07/28/19 0608 07/28/19 1646 07/29/19 1623 07/30/19 0323  AST 2,582* 5,857* 9,140* 3,065* 1,463*  ALT 1,692* 3,342* 5,567* 3,301* 2,980*  ALKPHOS 56 54 65 74 67  BILITOT 2.6* 1.9* 2.0* 2.3* 2.1*  PROT 5.1* 5.0* 5.4* 5.0* 5.3*  ALBUMIN 2.6* 2.4* 2.7* 2.5* 2.6*   No results for input(s): LIPASE, AMYLASE in the last 168 hours. Recent Labs  Lab 07/26/19 1500  AMMONIA 16   Coagulation Profile: Recent Labs  Lab 07/27/19 1649 07/28/19 0608 07/28/19 1646 07/29/19 0245 07/30/19 0323  INR  2.7* 3.9* 2.6* 2.3* 1.5*   Cardiac Enzymes: Recent Labs  Lab 07/26/19 1730  CKTOTAL 125   BNP (last 3 results) No results for input(s): PROBNP in the last 8760 hours. HbA1C: No results for input(s): HGBA1C in the last 72 hours. CBG: No results for input(s): GLUCAP in the last 168 hours. Lipid Profile: No results for input(s): CHOL, HDL, LDLCALC, TRIG, CHOLHDL, LDLDIRECT in the last 72 hours. Thyroid Function Tests: No results for input(s): TSH, T4TOTAL, FREET4, T3FREE, THYROIDAB in the last 72 hours. Anemia Panel: No results for input(s): VITAMINB12, FOLATE, FERRITIN, TIBC, IRON, RETICCTPCT in the last 72 hours. Sepsis Labs: Recent Labs  Lab 07/26/19 1600 07/26/19 2009  LATICACIDVEN 0.8 1.5    Recent Results (from the past 240 hour(s))  Respiratory Panel by RT PCR (Flu A&B, Covid) - Nasopharyngeal Swab     Status: None   Collection Time: 07/26/19 12:00 PM   Specimen: Nasopharyngeal Swab  Result Value Ref Range Status   SARS Coronavirus 2 by RT PCR NEGATIVE NEGATIVE Final    Comment: (NOTE) SARS-CoV-2 target nucleic acids are NOT DETECTED. The SARS-CoV-2 RNA is generally detectable in upper respiratoy specimens during the acute phase of infection. The lowest concentration of SARS-CoV-2 viral copies this assay can detect is 131 copies/mL. A negative result does not preclude SARS-Cov-2 infection and should not be used as the sole basis for treatment or other  patient management decisions. A negative result may occur with  improper specimen collection/handling, submission of specimen other than nasopharyngeal swab, presence of viral mutation(s) within the areas targeted by this assay, and inadequate number of viral copies (<131 copies/mL). A negative result must be combined with clinical observations, patient history, and epidemiological information. The expected result is Negative. Fact Sheet for Patients:  PinkCheek.be Fact Sheet for Healthcare Providers:  GravelBags.it This test is not yet ap proved or cleared by the [place] FDA and  has been authorized for detection and/or diagnosis of SARS-CoV-2 by FDA under an Emergency Use Authorization (EUA). This EUA will remain  in effect (meaning this test can be used) for the duration of the COVID-19 declaration under Section 564(b)(1) of the Act, 21 U.S.C. section 360bbb-3(b)(1), unless the authorization is terminated or revoked sooner.    Influenza A by PCR NEGATIVE NEGATIVE Final   Influenza B by PCR NEGATIVE NEGATIVE Final    Comment: (NOTE) The Xpert Xpress SARS-CoV-2/FLU/RSV assay is intended as an aid in  the diagnosis of influenza from Nasopharyngeal swab specimens and  should not be used as a sole basis for treatment. Nasal washings and  aspirates are unacceptable for Xpert Xpress SARS-CoV-2/FLU/RSV  testing. Fact Sheet for Patients: PinkCheek.be Fact Sheet for Healthcare Providers: GravelBags.it This test is not yet approved or cleared by the [place] FDA and  has been authorized for detection and/or diagnosis of SARS-CoV-2 by  FDA under an Emergency Use Authorization (EUA). This EUA will remain  in effect (meaning this test can be used) for the duration of the  Covid-19 declaration under Section 564(b)(1) of the Act, 21  U.S.C. section 360bbb-3(b)(1), unless  the authorization is  terminated or revoked. Performed at [facility], [place] [address], [place], [place] [address]   MRSA PCR Screening     Status: None   Collection Time: 07/26/19  9:28 PM   Specimen: Nasopharyngeal  Result Value Ref Range Status   MRSA by PCR NEGATIVE NEGATIVE Final    Comment:        The  GeneXpert MRSA Assay (FDA approved for NASAL specimens only), is one component of a comprehensive MRSA colonization surveillance program. It is not intended to diagnose MRSA infection nor to guide or monitor treatment for MRSA infections. Performed at [facility], [address] [place], [place] [address]          Radiology Studies: No results found.      Scheduled Meds: . sodium chloride   Intravenous Once  . sodium chloride   Intravenous Once  . sodium chloride   Intravenous Once  . ondansetron (ZOFRAN) IV  4 mg Intravenous Once  . pantoprazole (PROTONIX) IV  40 mg Intravenous Q12H  . potassium chloride  40 mEq Oral BID  . scopolamine  1 patch Transdermal Q72H   Continuous Infusions: . sodium chloride 150 mL/hr at 07/26/19 1322     LOS: 4 days     Alwyn Ren, MD  07/30/2019, 2:27 PM

## 2019-07-30 NOTE — Plan of Care (Signed)
  Problem: Education: Goal: Knowledge of General Education information will improve Description: Including pain rating scale, medication(s)/side effects and non-pharmacologic comfort measures Outcome: Progressing   Problem: Health Behavior/Discharge Planning: Goal: Ability to manage health-related needs will improve Outcome: Progressing   Problem: Clinical Measurements: Goal: Diagnostic test results will improve Outcome: Progressing   Problem: Activity: Goal: Risk for activity intolerance will decrease Outcome: Progressing   Problem: Coping: Goal: Level of anxiety will decrease Outcome: Progressing   Problem: Safety: Goal: Ability to remain free from injury will improve Outcome: Progressing   

## 2019-07-30 NOTE — Progress Notes (Signed)
Gastroenterology Inpatient Follow-up Note   PATIENT IDENTIFICATION  Dean Howard is a 54 y.o. male with a pmh significant for CAD, mental health disorders (depression/schizophrenia/bipolar disorder) who presented to the hospital with an attempted suicide in the setting of alcohol/NSAIDs/Tylenol overdose found to have acute liver injury as result. Hospital Day: 5  SUBJECTIVE  LFTs on the downtrend with INR downtrending as well. Patient denies any fevers or chills. No worsened abdominal pain at this time. No change in bowel habits. No pruritus at this time. Medicine service has stopped N-acetylcysteine.   OBJECTIVE  Scheduled Inpatient Medications:  . sodium chloride   Intravenous Once  . sodium chloride   Intravenous Once  . sodium chloride   Intravenous Once  . ondansetron (ZOFRAN) IV  4 mg Intravenous Once  . pantoprazole (PROTONIX) IV  40 mg Intravenous Q12H  . scopolamine  1 patch Transdermal Q72H   Continuous Inpatient Infusions:  . sodium chloride 150 mL/hr at 07/26/19 1322   PRN Inpatient Medications: hydrALAZINE   Physical Examination  Temp:  [98.3 F (36.8 C)-99.5 F (37.5 C)] 98.7 F (37.1 C) (02/06 1931) Pulse Rate:  [65-72] 72 (02/06 1931) Resp:  [15-23] 23 (02/06 1931) BP: (144-168)/(74-78) 148/77 (02/06 1931) SpO2:  [95 %-100 %] 100 % (02/06 1931) Temp (24hrs), Avg:98.9 F (37.2 C), Min:98.3 F (36.8 C), Max:99.5 F (37.5 C)  Weight: 69.7 kg GEN: NAD, appears stated age, doesn't appear chronically ill, sitter at bedside PSYCH: Cooperative EYE: Sclerae anicteric ENT: MMM CV: Nontachycardic RESP: No wheezing present GI: NABS, soft, protuberant abdomen, nontender MSK/EXT: Slight bilateral lower extremity edema SKIN: No overt jaundice NEURO:  Alert & Oriented x 3, no focal deficits, no evidence of asterixis   Review of Data   Laboratory Studies   Recent Labs  Lab 07/28/19 0608 07/28/19 1646 07/30/19 0323  NA 138   < > 136  K 3.5   <  > 3.0*  CL 109   < > 107  CO2 20*   < > 20*  BUN <5*   < > <5*  CREATININE 1.07   < > 1.06  GLUCOSE 82   < > 97  CALCIUM 8.0*   < > 8.5*  MG 1.8  --   --    < > = values in this interval not displayed.   Recent Labs  Lab 07/30/19 0323  AST 1,463*  ALT 2,980*  ALKPHOS 67    Recent Labs  Lab 07/28/19 1646 07/28/19 1646 07/29/19 0245 07/29/19 0245 07/30/19 0323  WBC 3.1*   < > 2.8*   < > 3.2*  HGB 10.8*   < > 10.1*   < > 10.3*  HCT 30.5*   < > 28.7*   < > 29.7*  PLT 22*  --  28*  --  58*   < > = values in this interval not displayed.   Recent Labs  Lab 07/28/19 1646 07/29/19 0245 07/30/19 0323  INR 2.6* 2.3* 1.5*   MELD-Na score: 16 at 07/30/2019  3:23 AM MELD score: 15 at 07/30/2019  3:23 AM Calculated from: Serum Creatinine: 1.06 mg/dL at 07/31/4130  4:40 AM Serum Sodium: 136 mmol/L at 07/30/2019  3:23 AM Total Bilirubin: 2.1 mg/dL at 1/0/2725  3:66 AM INR(ratio): 1.5 at 07/30/2019  3:23 AM Age: 66 years 2 months  Imaging Studies  No relevant studies to review  GI Procedures and Studies  No results found.   ASSESSMENT  Dean Howard is a 54 y.o. male  with a pmh significant for CAD, mental health disorders (depression/schizophrenia/bipolar disorder) who presented to the hospital with an attempted suicide in the setting of alcohol/NSAIDs/Tylenol overdose found to have acute liver injury as result.  Patient presentation most consistent with acute liver injury in the setting of combination of alcohol/NSAIDs/Tylenol overdose.  Thankfully liver tests are finally downtrending and INR is downtrending as well.  But see how the numbers look into tomorrow.  If the N-acetylcysteine has been stopped I think that is reasonable as long as liver tests continue to downtrend.  We may see a rise in bilirubin in the course the next few days but as long as her transferases are coming down the INR is not significantly increasing and can feel will be more comfortable that the patient is not  having severe liver failure.  Mental status intact.  Minimize any medications that may impair his mental status.  We will reevaluate him tomorrow.   PLAN/RECOMMENDATIONS  Trend LFTs and INR tomorrow Promote intake of 1.5 g/kg/day of Protein Promote fluid intake If NAC has been stopped then monitor closely   Please page/call with questions or concerns.   Dean Britain, MD [facility] Office # [phone]    LOS: 4 days  Dean Howard  07/30/2019, 10:41 PM

## 2019-07-30 NOTE — Progress Notes (Signed)
Pt lunch tray delivered.

## 2019-07-31 DIAGNOSIS — T1491XA Suicide attempt, initial encounter: Secondary | ICD-10-CM

## 2019-07-31 DIAGNOSIS — E876 Hypokalemia: Secondary | ICD-10-CM | POA: Diagnosis present

## 2019-07-31 DIAGNOSIS — F333 Major depressive disorder, recurrent, severe with psychotic symptoms: Secondary | ICD-10-CM

## 2019-07-31 DIAGNOSIS — D649 Anemia, unspecified: Secondary | ICD-10-CM | POA: Diagnosis present

## 2019-07-31 DIAGNOSIS — D61818 Other pancytopenia: Secondary | ICD-10-CM | POA: Diagnosis present

## 2019-07-31 LAB — CBC
HCT: 29.3 % — ABNORMAL LOW (ref 39.0–52.0)
Hemoglobin: 10.4 g/dL — ABNORMAL LOW (ref 13.0–17.0)
MCH: 31.1 pg (ref 26.0–34.0)
MCHC: 35.5 g/dL (ref 30.0–36.0)
MCV: 87.7 fL (ref 80.0–100.0)
Platelets: 101 10*3/uL — ABNORMAL LOW (ref 150–400)
RBC: 3.34 MIL/uL — ABNORMAL LOW (ref 4.22–5.81)
RDW: 15.2 % (ref 11.5–15.5)
WBC: 4.8 10*3/uL (ref 4.0–10.5)
nRBC: 0 % (ref 0.0–0.2)

## 2019-07-31 LAB — COMPREHENSIVE METABOLIC PANEL
ALT: 1740 U/L — ABNORMAL HIGH (ref 0–44)
AST: 409 U/L — ABNORMAL HIGH (ref 15–41)
Albumin: 2.7 g/dL — ABNORMAL LOW (ref 3.5–5.0)
Alkaline Phosphatase: 66 U/L (ref 38–126)
Anion gap: 7 (ref 5–15)
BUN: <5 mg/dL — ABNORMAL LOW (ref 6–20)
CO2: 23 mmol/L (ref 22–32)
Calcium: 8.7 mg/dL — ABNORMAL LOW (ref 8.9–10.3)
Chloride: 106 mmol/L (ref 98–111)
Creatinine, Ser: 1.03 mg/dL (ref 0.61–1.24)
GFR calc Af Amer: >60 mL/min (ref >60)
GFR calc non Af Amer: >60 mL/min (ref >60)
Glucose, Bld: 88 mg/dL (ref 70–99)
Potassium: 3.5 mmol/L (ref 3.5–5.1)
Sodium: 136 mmol/L (ref 135–145)
Total Bilirubin: 1.8 mg/dL — ABNORMAL HIGH (ref 0.3–1.2)
Total Protein: 5.4 g/dL — ABNORMAL LOW (ref 6.5–8.1)

## 2019-07-31 LAB — PROTIME-INR
INR: 1.2 (ref 0.8–1.2)
Prothrombin Time: 15.2 seconds (ref 11.4–15.2)

## 2019-07-31 NOTE — Progress Notes (Signed)
PROGRESS NOTE    Dean Howard  TIW:[id number] DOB: 1965/10/18 DOA: 07/26/2019 PCP: Patient, No Pcp Per  Brief Narrative:54 year old male admitted with intentional overdose. He has significant history of depression, schizophrenia, bipolar disorder, MI in 2014 previous suicidal attempts and CAD. He overdosed in 2020 on aspirin. This time he presented with overdose of Tylenol and ibuprofen. He took unknown number of 200 mg ibuprofen and 10 pills of 325 mg of Tylenol. Prior to coming into the hospital he took another 20 tablets of 200 mg of ibuprofen and started to have sharp abdominal pain. Patient reports feeling depressed due to lack of health insurance and lack of getting his disability hearing postponed to April 2021. He drinks 18-24 beers per day and continues to smoke marijuana daily for the last 4 years. He lives at home with his sister and his mother. He was found to have severe thrombocytopenia. Lately count less than 5000.  Assessment & Plan:   Principal Problem:   Overdose by acetaminophen Active Problems:   MDD (major depressive disorder), recurrent, severe, with psychosis (HCC)   Thrombocytopenia (HCC)   Suicidal behavior with attempted self-injury (HCC)   LFT elevation   Pancytopenia (HCC)   Normocytic anemia   Intentional overdose on acetaminophen and ibuprofen in an intent to kill himself from severe depression.  He was treated with Mucomyst drip till 07/30/2019.  The drip is stopped on 07/30/19 as his LFTs started trending down with INR less than 2.  Discussed with poison control.  Patient is able to eat and drink without much nausea or abdominal pain.  Continue scopolamine patch and IV fluids.  Follow-up labs daily. - AST 3065->1463->409; ALT 3301>2980>1740; t-bili 2.3>2.1>1.8 EKG today and daily QTC 443 down from463 down from510 Urine drug screen positive for THC alcohol level less than 10 salicylate level less than 7  Thrombocytopenia  He received platelet  transfusion vitamin K and FFP. Platelet 28->58 ->101 today  Coagulopathy from liver intoxication - INR is back to normal now  Mild neutropenia resolved  Normocytic anemia  - no bleeding, Hb stable around 10, no transfusion indication  appreciate input from hematology for his pancytopenia  - continue CBC and PT/INR daily  multiple suicidal attempts psych consult appreciated.  - They recommend inpatient psychiatric admission when he is medically stable. - continue suicidal precaution  alcohol abuse watch for withdrawals  possible gastritis from overdose and alcohol use-continue Protonix.   Estimated body mass index is 19.21 kg/m as calculated from the following:   Height as of this encounter: 6\' 3"  (1.905 m).   Weight as of this encounter: 69.7 kg.  DVT prophylaxis:SCD Code Status:Full code Family Communication:d/w mother and sister  Disposition Plan:Once medically stable he will be discharged to inpatient psych. Patient came from home. Barriers to discharge is patient with liver toxicity from intentional overdose needs close monitoring of electrolytes and EKG for QT prolongation. He likely will be ready for discharge to inpatient psych next week. Consultants:Psychiatrygi   Procedures:None Antimicrobials:None  Subjective: Resting in bed awake alert sitter by the bedside. LFT is nicely trending down: AST 3065->1463->409; ALT 3301>2980>1740; t-bili 2.3>2.1>1.8; INR 1.5>1.2; platelet 58->101. He feels well, no abdominal pain, nausea or vomiting. C/o left ankle pain which is chronic for 2-3 years. He c/o hospital food. Stated his mood is good today and no suicidal ideations.    Objective: Vitals:   07/30/19 2326 07/31/19 0304 07/31/19 0401 07/31/19 0802  BP: (!) 158/71 (!) 162/79 133/81 113/74  Pulse: (!) 58  09/28/19)  59   Resp: 19  15   Temp: 98.3 F (36.8 C)  98.3 F (36.8 C) 98.5 F (36.9 C)  TempSrc: Oral  Oral Oral  SpO2: 100%  98%   Weight:        Height:        Intake/Output Summary (Last 24 hours) at 07/31/2019 1039 Last data filed at 07/31/2019 0826 Gross per 24 hour  Intake 7111.42 ml  Output 4150 ml  Net 2961.42 ml   Filed Weights   07/26/19 1130 07/26/19 2140  Weight: 77.1 kg 69.7 kg    Examination:  General exam: Appears calm and comfortable  Respiratory system: Clear to auscultation. Respiratory effort normal. Cardiovascular system: S1 & S2 heard, RRR. No JVD, murmurs, rubs, gallops or clicks. No pedal edema. Gastrointestinal system: Abdomen is nondistended, soft and nontender. No organomegaly or masses felt. Normal bowel sounds heard. Central nervous system: Alert and oriented. No focal neurological deficits. Extremities: Symmetric 5 x 5 power. Skin: No rashes, lesions or ulcers Psychiatry: Judgement and insight appear normal. Mood & affect appropriate.     Data Reviewed: I have personally reviewed following labs and imaging studies  CBC: Recent Labs  Lab 07/27/19 0637 07/27/19 0637 07/27/19 1649 07/27/19 1649 07/28/19 0608 07/28/19 1646 07/29/19 0245 07/30/19 0323 07/31/19 0545  WBC 2.9*   < > 2.8*   < > 2.6* 3.1* 2.8* 3.2* 4.8  NEUTROABS 1.7  --  1.9  --  1.7 2.2 1.6*  --   --   HGB 11.5*   < > 11.6*   < > 11.2* 10.8* 10.1* 10.3* 10.4*  HCT 33.5*   < > 33.4*   < > 32.7* 30.5* 28.7* 29.7* 29.3*  MCV 90.3   < > 88.8   < > 90.6 88.4 88.3 88.9 87.7  PLT <5*   < > 6*   < > 6* 22* 28* 58* 101*   < > = values in this interval not displayed.   Basic Metabolic Panel: Recent Labs  Lab 07/26/19 1008 07/26/19 1500 07/26/19 1730 07/26/19 2009 07/28/19 0608 07/28/19 1646 07/29/19 1623 07/30/19 0323 07/31/19 0545  NA 137   < >  --    < > 138 136 136 136 136  K 3.8   < >  --    < > 3.5 3.6 3.2* 3.0* 3.5  CL 103   < >  --    < > 109 106 107 107 106  CO2 24   < >  --    < > 20* 21* 21* 20* 23  GLUCOSE 112*   < >  --    < > 82 114* 103* 97 88  BUN 15   < >  --    < > <5* <5* <5* <5* <5*  CREATININE  1.48*   < >  --    < > 1.07 1.08 1.02 1.06 1.03  CALCIUM 9.2   < >  --    < > 8.0* 8.4* 8.5* 8.5* 8.7*  MG 2.1  --  2.2  --  1.8  --   --   --   --    < > = values in this interval not displayed.   GFR: Estimated Creatinine Clearance: 82.7 mL/min (by C-G formula based on SCr of 1.03 mg/dL). Liver Function Tests: Recent Labs  Lab 07/28/19 0608 07/28/19 1646 07/29/19 1623 07/30/19 0323 07/31/19 0545  AST 5,857* 9,140* 3,065* 1,463* 409*  ALT 3,342* 5,567* 3,301* 2,980* 1,740*  ALKPHOS 54 65 74 67 66  BILITOT 1.9* 2.0* 2.3* 2.1* 1.8*  PROT 5.0* 5.4* 5.0* 5.3* 5.4*  ALBUMIN 2.4* 2.7* 2.5* 2.6* 2.7*   No results for input(s): LIPASE, AMYLASE in the last 168 hours. Recent Labs  Lab 07/26/19 1500  AMMONIA 16   Coagulation Profile: Recent Labs  Lab 07/28/19 0608 07/28/19 1646 07/29/19 0245 07/30/19 0323 07/31/19 0545  INR 3.9* 2.6* 2.3* 1.5* 1.2   Cardiac Enzymes: Recent Labs  Lab 07/26/19 1730  CKTOTAL 125   BNP (last 3 results) No results for input(s): PROBNP in the last 8760 hours. HbA1C: No results for input(s): HGBA1C in the last 72 hours. CBG: No results for input(s): GLUCAP in the last 168 hours. Lipid Profile: No results for input(s): CHOL, HDL, LDLCALC, TRIG, CHOLHDL, LDLDIRECT in the last 72 hours. Thyroid Function Tests: No results for input(s): TSH, T4TOTAL, FREET4, T3FREE, THYROIDAB in the last 72 hours. Anemia Panel: No results for input(s): VITAMINB12, FOLATE, FERRITIN, TIBC, IRON, RETICCTPCT in the last 72 hours. Sepsis Labs: Recent Labs  Lab 07/26/19 1600 07/26/19 2009  LATICACIDVEN 0.8 1.5    Recent Results (from the past 240 hour(s))  Respiratory Panel by RT PCR (Flu A&B, Covid) - Nasopharyngeal Swab     Status: None   Collection Time: 07/26/19 12:00 PM   Specimen: Nasopharyngeal Swab  Result Value Ref Range Status   SARS Coronavirus 2 by RT PCR NEGATIVE NEGATIVE Final    Comment: (NOTE) SARS-CoV-2 target nucleic acids are NOT  DETECTED. The SARS-CoV-2 RNA is generally detectable in upper respiratoy specimens during the acute phase of infection. The lowest concentration of SARS-CoV-2 viral copies this assay can detect is 131 copies/mL. A negative result does not preclude SARS-Cov-2 infection and should not be used as the sole basis for treatment or other patient management decisions. A negative result may occur with  improper specimen collection/handling, submission of specimen other than nasopharyngeal swab, presence of viral mutation(s) within the areas targeted by this assay, and inadequate number of viral copies (<131 copies/mL). A negative result must be combined with clinical observations, patient history, and epidemiological information. The expected result is Negative. Fact Sheet for Patients:  https://www.moore.com/ Fact Sheet for Healthcare Providers:  https://www.young.biz/ This test is not yet ap proved or cleared by the [place] FDA and  has been authorized for detection and/or diagnosis of SARS-CoV-2 by FDA under an Emergency Use Authorization (EUA). This EUA will remain  in effect (meaning this test can be used) for the duration of the COVID-19 declaration under Section 564(b)(1) of the Act, 21 U.S.C. section 360bbb-3(b)(1), unless the authorization is terminated or revoked sooner.    Influenza A by PCR NEGATIVE NEGATIVE Final   Influenza B by PCR NEGATIVE NEGATIVE Final    Comment: (NOTE) The Xpert Xpress SARS-CoV-2/FLU/RSV assay is intended as an aid in  the diagnosis of influenza from Nasopharyngeal swab specimens and  should not be used as a sole basis for treatment. Nasal washings and  aspirates are unacceptable for Xpert Xpress SARS-CoV-2/FLU/RSV  testing. Fact Sheet for Patients: https://www.moore.com/ Fact Sheet for Healthcare Providers: https://www.young.biz/ This test is not yet approved or cleared  by the [place] FDA and  has been authorized for detection and/or diagnosis of SARS-CoV-2 by  FDA under an Emergency Use Authorization (EUA). This EUA will remain  in effect (meaning this test can be used) for the duration of the  Covid-19 declaration under Section 564(b)(1) of the Act, 21  U.S.C. section 360bbb-3(b)(1), unless  the authorization is  terminated or revoked. Performed at [facility], [place] [address], [place], [place] [address]   MRSA PCR Screening     Status: None   Collection Time: 07/26/19  9:28 PM   Specimen: Nasopharyngeal  Result Value Ref Range Status   MRSA by PCR NEGATIVE NEGATIVE Final    Comment:        The GeneXpert MRSA Assay (FDA approved for NASAL specimens only), is one component of a comprehensive MRSA colonization surveillance program. It is not intended to diagnose MRSA infection nor to guide or monitor treatment for MRSA infections. Performed at [facility], [place] [address], [place], [place] [address]          Radiology Studies: No results found.      Scheduled Meds: . ondansetron (ZOFRAN) IV  4 mg Intravenous Once  . pantoprazole (PROTONIX) IV  40 mg Intravenous Q12H  . scopolamine  1 patch Transdermal Q72H   Continuous Infusions: . sodium chloride 150 mL/hr at 07/31/19 0318     LOS: 5 days   Coding: total 25 min on this encounter with >50% of time on direct patient care and plan formulation  Paticia Stack, MD  07/31/2019, 10:39 AM

## 2019-07-31 NOTE — Progress Notes (Signed)
Gastroenterology Inpatient Follow-up Note   PATIENT IDENTIFICATION  Dean Howard is a 54 y.o. male with a pmh significant for CAD, mental health disorders (depression/schizophrenia/bipolar disorder) who presented to the hospital with an attempted suicide in the setting of alcohol/NSAIDs/Tylenol overdose found to have acute liver injury as result. Hospital Day: 6  SUBJECTIVE  LFTs continue to downtrend as does INR. No significant GI complaints today.   OBJECTIVE  Scheduled Inpatient Medications:  . ondansetron (ZOFRAN) IV  4 mg Intravenous Once  . pantoprazole (PROTONIX) IV  40 mg Intravenous Q12H  . scopolamine  1 patch Transdermal Q72H   Continuous Inpatient Infusions:  . sodium chloride 150 mL/hr at 07/31/19 0318   PRN Inpatient Medications: hydrALAZINE   Physical Examination  Temp:  [98.3 F (36.8 C)-98.9 F (37.2 C)] 98.9 F (37.2 C) (02/07 1154) Pulse Rate:  [58-72] 59 (02/07 0401) Resp:  [15-23] 15 (02/07 0401) BP: (113-162)/(71-81) 147/75 (02/07 1154) SpO2:  [98 %-100 %] 98 % (02/07 0401) Temp (24hrs), Avg:98.5 F (36.9 C), Min:98.3 F (36.8 C), Max:98.9 F (37.2 C)  Weight: 69.7 kg GEN: NAD, appears stated age, doesn't appear chronically ill, sitter at bedside PSYCH: Cooperative EYE: Sclerae anicteric ENT: MMM CV: Nontachycardic RESP: No wheezing present GI: NABS, soft, protuberant abdomen, nontender MSK/EXT: Slight bilateral lower extremity edema SKIN: No overt jaundice NEURO:  Alert & Oriented x 3, no focal deficits, no evidence of asterixis   Review of Data   Laboratory Studies   Recent Labs  Lab 07/28/19 0608 07/28/19 1646 07/31/19 0545  NA 138   < > 136  K 3.5   < > 3.5  CL 109   < > 106  CO2 20*   < > 23  BUN <5*   < > <5*  CREATININE 1.07   < > 1.03  GLUCOSE 82   < > 88  CALCIUM 8.0*   < > 8.7*  MG 1.8  --   --    < > = values in this interval not displayed.   Recent Labs  Lab 07/31/19 0545  AST 409*  ALT 1,740*    ALKPHOS 66    Recent Labs  Lab 07/29/19 0245 07/29/19 0245 07/30/19 0323 07/30/19 0323 07/31/19 0545  WBC 2.8*   < > 3.2*   < > 4.8  HGB 10.1*   < > 10.3*   < > 10.4*  HCT 28.7*   < > 29.7*   < > 29.3*  PLT 28*  --  58*  --  101*   < > = values in this interval not displayed.   Recent Labs  Lab 07/29/19 0245 07/30/19 0323 07/31/19 0545  INR 2.3* 1.5* 1.2   MELD-Na score: 11 at 07/31/2019  5:45 AM MELD score: 11 at 07/31/2019  5:45 AM Calculated from: Serum Creatinine: 1.03 mg/dL at 02/23/7901  4:09 AM Serum Sodium: 136 mmol/L at 07/31/2019  5:45 AM Total Bilirubin: 1.8 mg/dL at 12/24/5327  9:24 AM INR(ratio): 1.2 at 07/31/2019  5:45 AM Age: 12 years 2 months  Imaging Studies  No relevant studies to review  GI Procedures and Studies  No results found.   ASSESSMENT  Dean Howard is a 53 y.o. male with a pmh significant for CAD, mental health disorders (depression/schizophrenia/bipolar disorder) who presented to the hospital with an attempted suicide in the setting of alcohol/NSAIDs/Tylenol overdose found to have acute liver injury as result.  Patient improving daily.  Still need to monitor LFTs but if they are  still downtrending tomorrow we will likely sign off and plan for LFTs to be checked every other day at that time.  He is at high risk of recurrent behavioral issues so it will be helpful for him to get the help that he needs.   PLAN/RECOMMENDATIONS  Trend LFTs and INR tomorrow Promote intake of 1.5 g/kg/day of Protein Transition to PO PPI at this time Promote fluid intake Patient will need follow up with PCP and labs eventually - if he wants to follow up with Dean Howard who has seen him please let us know   Please page/call with questions or concerns.   Dean Britain, MD [facility] Office # [phone]    LOS: 5 days  Irving Copas  07/31/2019, 4:35 PM

## 2019-08-01 DIAGNOSIS — E876 Hypokalemia: Secondary | ICD-10-CM

## 2019-08-01 LAB — CBC
HCT: 29.5 % — ABNORMAL LOW (ref 39.0–52.0)
Hemoglobin: 10.4 g/dL — ABNORMAL LOW (ref 13.0–17.0)
MCH: 31.4 pg (ref 26.0–34.0)
MCHC: 35.3 g/dL (ref 30.0–36.0)
MCV: 89.1 fL (ref 80.0–100.0)
Platelets: 143 10*3/uL — ABNORMAL LOW (ref 150–400)
RBC: 3.31 MIL/uL — ABNORMAL LOW (ref 4.22–5.81)
RDW: 15.4 % (ref 11.5–15.5)
WBC: 5.7 10*3/uL (ref 4.0–10.5)
nRBC: 0 % (ref 0.0–0.2)

## 2019-08-01 LAB — COMPREHENSIVE METABOLIC PANEL
ALT: 1372 U/L — ABNORMAL HIGH (ref 0–44)
AST: 208 U/L — ABNORMAL HIGH (ref 15–41)
Albumin: 3 g/dL — ABNORMAL LOW (ref 3.5–5.0)
Alkaline Phosphatase: 70 U/L (ref 38–126)
Anion gap: 14 (ref 5–15)
BUN: 5 mg/dL — ABNORMAL LOW (ref 6–20)
CO2: 26 mmol/L (ref 22–32)
Calcium: 9.3 mg/dL (ref 8.9–10.3)
Chloride: 101 mmol/L (ref 98–111)
Creatinine, Ser: 1.07 mg/dL (ref 0.61–1.24)
GFR calc Af Amer: >60 mL/min (ref >60)
GFR calc non Af Amer: >60 mL/min (ref >60)
Glucose, Bld: 84 mg/dL (ref 70–99)
Potassium: 3.6 mmol/L (ref 3.5–5.1)
Sodium: 141 mmol/L (ref 135–145)
Total Bilirubin: 2.1 mg/dL — ABNORMAL HIGH (ref 0.3–1.2)
Total Protein: 5.9 g/dL — ABNORMAL LOW (ref 6.5–8.1)

## 2019-08-01 LAB — PROTIME-INR
INR: 1.2 (ref 0.8–1.2)
Prothrombin Time: 15 seconds (ref 11.4–15.2)

## 2019-08-01 MED ORDER — PANTOPRAZOLE SODIUM 40 MG PO TBEC
40.0000 mg | DELAYED_RELEASE_TABLET | Freq: Every day | ORAL | Status: DC
  Administered 2019-08-02 – 2019-08-03 (×2): 40 mg via ORAL
  Filled 2019-08-01 (×2): qty 1

## 2019-08-01 NOTE — Plan of Care (Signed)
  Problem: Education: Goal: Knowledge of General Education information will improve Description: Including pain rating scale, medication(s)/side effects and non-pharmacologic comfort measures Outcome: Progressing   Problem: Clinical Measurements: Goal: Diagnostic test results will improve Outcome: Progressing   Problem: Coping: Goal: Level of anxiety will decrease Outcome: Progressing   Problem: Safety: Goal: Ability to remain free from injury will improve Outcome: Progressing   Problem: Activity: Goal: Risk for activity intolerance will decrease Outcome: Adequate for Discharge

## 2019-08-01 NOTE — Progress Notes (Signed)
CSW sent referral to [facility] and [facility] for review.   Osborne Casco Juvenal Umar LCSW [phone]

## 2019-08-01 NOTE — Progress Notes (Signed)
UNASSIGNED PATIENT Subjective: Dean Howard is a 54 year old 24-year-old black male admitted to the hospital with drug overdose. His medical history is complicated by depression, multiple suicide attempts, coronary artery disease, alcohol and marijuana use.  He has a history of depression schizophrenia and bipolar disorder was and is noncompliant with his psychiatric medications.  Of late has been feeling depressed because he will not be able to get disability and his disability hearing has been postponed till April 2021 he reportedly overdosed on ibuprofen and Tylenol complicated by alcohol and was found to have acute liver injury on presentation to the ER.  His AST was 9140 and ALT was 5567 with a total bilirubin of 2 BUN of less than 5 and a creatinine of 1.08 his platelets were low at 22 hemo with hemoglobin of 10.8.  His AST is 208 today with ALT of 1372 and platelets of 143K with platelet transfusions vitamin K and FFP; patient was treated with Mucomyst drip till 07/30/2019.  His INR has normalized as well. Patient's mother is at the bedside.   Objective: Vital signs in last 24 hours: Temp:  [98.5 F (36.9 C)-99.2 F (37.3 C)] 98.7 F (37.1 C) (02/08 0800) Pulse Rate:  [25-66] 25 (02/08 0800) Resp:  [12-25] 25 (02/08 0800) BP: (100-172)/(72-94) 148/94 (02/08 0800) SpO2:  [96 %-100 %] 98 % (02/08 0800) Last BM Date: 08/01/19  Intake/Output from previous day: 02/07 0701 - 02/08 0700 In: 840 [P.O.:840] Out: 850 [Urine:850] Intake/Output this shift: Total I/O In: 240 [P.O.:240] Out: 1 [Stool:1]  General appearance: alert, appears stated age and no distress Resp: clear to auscultation bilaterally Cardio: regular rate and rhythm, S1, S2 normal, no murmur, click, rub or gallop GI: soft, non-tender; bowel sounds normal; no masses,  no organomegaly Extremities: extremities normal, atraumatic, no cyanosis or edema  Lab Results: Recent Labs    07/30/19 0323 07/31/19 0545 08/01/19 0317   WBC 3.2* 4.8 5.7  HGB 10.3* 10.4* 10.4*  HCT 29.7* 29.3* 29.5*  PLT 58* 101* 143*   BMET Recent Labs    07/30/19 0323 07/31/19 0545 08/01/19 0317  NA 136 136 141  K 3.0* 3.5 3.6  CL 107 106 101  CO2 20* 23 26  GLUCOSE 97 88 84  BUN <5* <5* 5*  CREATININE 1.06 1.03 1.07  CALCIUM 8.5* 8.7* 9.3   LFT Recent Labs    08/01/19 0317  PROT 5.9*  ALBUMIN 3.0*  AST 208*  ALT 1,372*  ALKPHOS 70  BILITOT 2.1*   PT/INR Recent Labs    07/31/19 0545 08/01/19 0317  LABPROT 15.2 15.0  INR 1.2 1.2   Medications: I have reviewed the patient's current medications.  Assessment/Plan: 1) Abnormal LFTs with coagulopathy-secondary to drug overdose complicated by alcohol and marijuana use-patient's had significant improvement since admission.  Platelets and INR have normalized.  We will sign off please call as needed 2) Bipolar disorder/schizophrenia/major depressive disorder/attempted suicide. 3) Normocytic anemia  LOS: 6 days   Charna Elizabeth 08/01/2019, 11:32 AM

## 2019-08-01 NOTE — Progress Notes (Signed)
PT Cancellation Note  Patient Details Name: RAVI TUCCILLO MRN: [id number] DOB: 1966/03/24   Cancelled Treatment:    Reason Eval/Treat Not Completed: Fatigue/lethargy limiting ability to participate. Tx attempted, patient sleeping, unable to rouse. RN states patient did not sleep last night and has been up all day, ambulating to bathroom etc. Will re-attempt at later time.    Riddhi Grether 08/01/2019, 2:39 PM

## 2019-08-01 NOTE — Progress Notes (Addendum)
Pt self removed midline from L arm @shift  change, on call paged re: orders to keep PIV in, or to keep PIV out?  2033: On call states that we can leave out, but if we need a line we can put one in - po meds & HgB doing well

## 2019-08-01 NOTE — Progress Notes (Signed)
Patient is not seen. Noted his LFTs are improving Pancytopenia is improving Coagulopathy has resolved I will sign off

## 2019-08-01 NOTE — Progress Notes (Signed)
PROGRESS NOTE    Dean Howard  ZCH:[id number] DOB: [birth year] DOA: 07/26/2019 PCP: Patient, No Pcp Per    Brief Narrative:  54 year old male presented with a suicidal attempt.  He does have significant past medical history for coronary disease, depression with prior suicide attempts.  Apparently patient took a half a bottle of unknown number of 200 mg ibuprofen and 10 tablets of 325 mg Tylenol, following morning he took another 20 tablets of 200 mg ibuprofen.  He developed sharp epigastric nonradiating abdominal pain.  Examination his blood pressure was 144/85, pulse rate 67, respirate 19, temperature 98.4, oxygen saturation 100%.  His lungs are clear to auscultation bilaterally, heart S1-S2 present rhythm, soft abdomen and extremity edema, patient was neurologically nonfocal. Sodium 137, potassium 3.8, chloride 103, bicarb 24, glucose 112, BUN 15, creatinine 1.48, AST 443, ALT 201, white count 5.4, hemoglobin 13.0, hematocrit 38.9, platelets less than 5.   Patient was admitted to the hospital with working diagnosis of acetaminophen/ ibuprofen overdose complicated by thrombocytopenia.   Patient was treated with N acetylcysteine and IV fluids, with good toleration, no encephalopathy or worsening coagulopathy, LFT trending down.  Pancytopenia, clinically improving, sp platelet transfusion.    Assessment & Plan:   Principal Problem:   Overdose by acetaminophen Active Problems:   MDD (major depressive disorder), recurrent, severe, with psychosis (HCC)   Thrombocytopenia (HCC)   Suicidal behavior with attempted self-injury (HCC)   LFT elevation   Pancytopenia (HCC)   Normocytic anemia    1. Acute acetaminophen and ibuprofen intoxication, suicidal attempt. Patient with no encephalopathy, LFT are trending down AST 208 and ALT 1,372, T Bil stable at 2,1. INR down to 1,2. Patient is tolerating po well. Completed N acetylcysteine infusion. Will dc IV fluids and continue LFT monitoring  while hospitalized.  2. Thrombocytopenia/coagulopathy/ anemia . Marrow failure, sp platelet transfusion, FFP and vitamin K. Platelet count improving, now up to 143, Hgb at 10,4 and wbc at 5,7. Will continue to follow cell count while hospitalized.   3. Alcohol abuse. Patient has been calm and cooperating, no signs of active withdrawals. Continue neuro checks.   4. Gastritis. Continue antiacid therapy with pantoprazole.   5. Depression with suicidal attempts. Continue one to one suicidal precautions, patient is medically stable to transfer to psych unit. Follow with psychiatry recommendations.    DVT prophylaxis: scd   Code Status: full Family Communication: no family at the bedside  Disposition Plan/ discharge barriers: patient is medically stable for inpatient psych.    Consultants:   Psychiatry   GI    Subjective: Patient is feeling better, positive polyuria, no chest pain, no dyspnea, no nausea or vomiting. No anxiety.   Objective: Vitals:   08/01/19 0337 08/01/19 0402 08/01/19 0750 08/01/19 0800  BP: (!) 172/88   (!) 148/94  Pulse: 60  66 (!) 25  Resp: 12  19 (!) 25  Temp: 99 F (37.2 C) 98.6 F (37 C)  98.7 F (37.1 C)  TempSrc: Oral Oral  Oral  SpO2: 96%  100% 98%  Weight:      Height:        Intake/Output Summary (Last 24 hours) at 08/01/2019 0902 Last data filed at 08/01/2019 0800 Gross per 24 hour  Intake 840 ml  Output 301 ml  Net 539 ml   Filed Weights   07/26/19 1130 07/26/19 2140  Weight: 77.1 kg 69.7 kg    Examination:   General: Not in pain or dyspnea.  Neurology: Awake and alert,  non focal  E ENT: no pallor, no icterus, oral mucosa moist Cardiovascular: No JVD. S1-S2 present, rhythmic, no gallops, rubs, or murmurs. No lower extremity edema. Pulmonary: positive breath sounds bilaterally, adequate air movement, no wheezing, rhonchi or rales. Gastrointestinal. Abdomen mild distended with no organomegaly, non tender, no rebound or guarding Skin.  No rashes Musculoskeletal: no joint deformities     Data Reviewed: I have personally reviewed following labs and imaging studies  CBC: Recent Labs  Lab 07/27/19 0637 07/27/19 0637 07/27/19 1649 07/27/19 1649 07/28/19 0608 07/28/19 0608 07/28/19 1646 07/29/19 0245 07/30/19 0323 07/31/19 0545 08/01/19 0317  WBC 2.9*   < > 2.8*   < > 2.6*   < > 3.1* 2.8* 3.2* 4.8 5.7  NEUTROABS 1.7  --  1.9  --  1.7  --  2.2 1.6*  --   --   --   HGB 11.5*   < > 11.6*   < > 11.2*   < > 10.8* 10.1* 10.3* 10.4* 10.4*  HCT 33.5*   < > 33.4*   < > 32.7*   < > 30.5* 28.7* 29.7* 29.3* 29.5*  MCV 90.3   < > 88.8   < > 90.6   < > 88.4 88.3 88.9 87.7 89.1  PLT <5*   < > 6*   < > 6*   < > 22* 28* 58* 101* 143*   < > = values in this interval not displayed.   Basic Metabolic Panel: Recent Labs  Lab 07/26/19 1008 07/26/19 1500 07/26/19 1730 07/26/19 2009 07/28/19 0608 07/28/19 0608 07/28/19 1646 07/29/19 1623 07/30/19 0323 07/31/19 0545 08/01/19 0317  NA 137   < >  --    < > 138   < > 136 136 136 136 141  K 3.8   < >  --    < > 3.5   < > 3.6 3.2* 3.0* 3.5 3.6  CL 103   < >  --    < > 109   < > 106 107 107 106 101  CO2 24   < >  --    < > 20*   < > 21* 21* 20* 23 26  GLUCOSE 112*   < >  --    < > 82   < > 114* 103* 97 88 84  BUN 15   < >  --    < > <5*   < > <5* <5* <5* <5* 5*  CREATININE 1.48*   < >  --    < > 1.07   < > 1.08 1.02 1.06 1.03 1.07  CALCIUM 9.2   < >  --    < > 8.0*   < > 8.4* 8.5* 8.5* 8.7* 9.3  MG 2.1  --  2.2  --  1.8  --   --   --   --   --   --    < > = values in this interval not displayed.   GFR: Estimated Creatinine Clearance: 79.6 mL/min (by C-G formula based on SCr of 1.07 mg/dL). Liver Function Tests: Recent Labs  Lab 07/28/19 1646 07/29/19 1623 07/30/19 0323 07/31/19 0545 08/01/19 0317  AST 9,140* 3,065* 1,463* 409* 208*  ALT 5,567* 3,301* 2,980* 1,740* 1,372*  ALKPHOS 65 74 67 66 70  BILITOT 2.0* 2.3* 2.1* 1.8* 2.1*  PROT 5.4* 5.0* 5.3* 5.4* 5.9*    ALBUMIN 2.7* 2.5* 2.6* 2.7* 3.0*   No results for input(s): LIPASE, AMYLASE in  the last 168 hours. Recent Labs  Lab 07/26/19 1500  AMMONIA 16   Coagulation Profile: Recent Labs  Lab 07/28/19 1646 07/29/19 0245 07/30/19 0323 07/31/19 0545 08/01/19 0317  INR 2.6* 2.3* 1.5* 1.2 1.2   Cardiac Enzymes: Recent Labs  Lab 07/26/19 1730  CKTOTAL 125   BNP (last 3 results) No results for input(s): PROBNP in the last 8760 hours. HbA1C: No results for input(s): HGBA1C in the last 72 hours. CBG: No results for input(s): GLUCAP in the last 168 hours. Lipid Profile: No results for input(s): CHOL, HDL, LDLCALC, TRIG, CHOLHDL, LDLDIRECT in the last 72 hours. Thyroid Function Tests: No results for input(s): TSH, T4TOTAL, FREET4, T3FREE, THYROIDAB in the last 72 hours. Anemia Panel: No results for input(s): VITAMINB12, FOLATE, FERRITIN, TIBC, IRON, RETICCTPCT in the last 72 hours.    Radiology Studies: I have reviewed all of the imaging during this hospital visit personally     Scheduled Meds: . ondansetron (ZOFRAN) IV  4 mg Intravenous Once  . pantoprazole (PROTONIX) IV  40 mg Intravenous Q12H  . scopolamine  1 patch Transdermal Q72H   Continuous Infusions: . sodium chloride 150 mL/hr at 07/31/19 0318     LOS: 6 days        Jayland Null Annett Gula, MD

## 2019-08-02 LAB — COMPREHENSIVE METABOLIC PANEL
ALT: 1062 U/L — ABNORMAL HIGH (ref 0–44)
AST: 112 U/L — ABNORMAL HIGH (ref 15–41)
Albumin: 3 g/dL — ABNORMAL LOW (ref 3.5–5.0)
Alkaline Phosphatase: 71 U/L (ref 38–126)
Anion gap: 8 (ref 5–15)
BUN: 9 mg/dL (ref 6–20)
CO2: 26 mmol/L (ref 22–32)
Calcium: 9.2 mg/dL (ref 8.9–10.3)
Chloride: 105 mmol/L (ref 98–111)
Creatinine, Ser: 1.32 mg/dL — ABNORMAL HIGH (ref 0.61–1.24)
GFR calc Af Amer: >60 mL/min (ref >60)
GFR calc non Af Amer: >60 mL/min (ref >60)
Glucose, Bld: 89 mg/dL (ref 70–99)
Potassium: 3.7 mmol/L (ref 3.5–5.1)
Sodium: 139 mmol/L (ref 135–145)
Total Bilirubin: 1.6 mg/dL — ABNORMAL HIGH (ref 0.3–1.2)
Total Protein: 6.1 g/dL — ABNORMAL LOW (ref 6.5–8.1)

## 2019-08-02 LAB — CBC
HCT: 31 % — ABNORMAL LOW (ref 39.0–52.0)
Hemoglobin: 10.7 g/dL — ABNORMAL LOW (ref 13.0–17.0)
MCH: 31.4 pg (ref 26.0–34.0)
MCHC: 34.5 g/dL (ref 30.0–36.0)
MCV: 90.9 fL (ref 80.0–100.0)
Platelets: 163 10*3/uL (ref 150–400)
RBC: 3.41 MIL/uL — ABNORMAL LOW (ref 4.22–5.81)
RDW: 15.9 % — ABNORMAL HIGH (ref 11.5–15.5)
WBC: 5 10*3/uL (ref 4.0–10.5)
nRBC: 0 % (ref 0.0–0.2)

## 2019-08-02 LAB — PROTIME-INR
INR: 1.1 (ref 0.8–1.2)
Prothrombin Time: 14.1 seconds (ref 11.4–15.2)

## 2019-08-02 MED ORDER — PANTOPRAZOLE SODIUM 40 MG PO TBEC: 40.0000 mg | DELAYED_RELEASE_TABLET | Freq: Every day | ORAL | 0 refills | Status: DC

## 2019-08-02 MED ORDER — AMMONIUM LACTATE 12 % EX LOTN
TOPICAL_LOTION | Freq: Three times a day (TID) | CUTANEOUS | Status: DC
  Administered 2019-08-02: 1 via TOPICAL
  Filled 2019-08-02: qty 225

## 2019-08-02 NOTE — Evaluation (Addendum)
Occupational Therapy Evaluation and Discharge Patient Details Name: Dean Howard MRN: [id number] DOB: 01-05-1966 Today's Date: 08/02/2019    History of Present Illness Pt is a 54 y.o. male admitted 07/26/19 after acetaminophen overdose for apparent suicidal attempt. PMH includesCAD,  MDD, polysubstance abuse.   Clinical Impression   PTA patient independent ADls, iADLs. Admitted for above and presenting near baseline level for ADLs, mobility.  Completing in room mobility, ADLs and transfers with modified independence.  Anticipate cognition is baseline, some difficulties with attending to task, multitasking. Pt reports intermittent pain and unsteadiness due to B ankle pain at home, but appears [facility] today.  Plans to dc to behavioral health.  Pt with no further questions or concerns. No further OT needs identified at this time. OT will sign off.     Follow Up Recommendations  No OT follow up;Supervision - Intermittent    Equipment Recommendations  None recommended by OT    Recommendations for Other Services       Precautions / Restrictions Precautions Precautions: None Restrictions Weight Bearing Restrictions: No      Mobility Bed Mobility Overal bed mobility: Independent                Transfers Overall transfer level: Independent                    Balance Overall balance assessment: Mild deficits observed, not formally tested                               Standardized Balance Assessment Standardized Balance Assessment : Dynamic Gait Index   Dynamic Gait Index Level Surface: Normal Change in Gait Speed: Normal Gait with Horizontal Head Turns: Mild Impairment Gait with Vertical Head Turns: Normal Gait and Pivot Turn: Normal Step Over Obstacle: Normal Step Around Obstacles: Normal Steps: Normal Total Score: 23     ADL either performed or assessed with clinical judgement   ADL Overall ADL's : Modified independent                                              Vision   Vision Assessment?: No apparent visual deficits     Perception     Praxis      Pertinent Vitals/Pain Pain Assessment: Faces Faces Pain Scale: Hurts a little bit Pain Location: Bilateral ankles (chronic) Pain Descriptors / Indicators: Sore Pain Intervention(s): Monitored during session     Hand Dominance     Extremity/Trunk Assessment Upper Extremity Assessment Upper Extremity Assessment: Overall WFL for tasks assessed   Lower Extremity Assessment Lower Extremity Assessment: Defer to PT evaluation   Cervical / Trunk Assessment Cervical / Trunk Assessment: Normal   Communication Communication Communication: No difficulties   Cognition Arousal/Alertness: Awake/alert Behavior During Therapy: WFL for tasks assessed/performed Overall Cognitive Status: Within Functional Limits for tasks assessed                                 General Comments: WFL for simple tasks; some poor attention and higher level processing deficits noted; potential baseline developmental delay   General Comments       Exercises     Shoulder Instructions      Home Living Family/patient expects to be discharged to:: Other (  Comment) Living Arrangements: Parent;Other relatives                               Additional Comments: Plans to d/c to Behavioral Health      Prior Functioning/Environment Level of Independence: Independent                 OT Problem List:        OT Treatment/Interventions:      OT Goals(Current goals can be found in the care plan section) Acute Rehab OT Goals Patient Stated Goal: to get back on my medicine  OT Goal Formulation: With patient  OT Frequency:     Barriers to D/C:            Co-evaluation              AM-PAC OT "6 Clicks" Daily Activity     Outcome Measure Help from another person eating meals?: None Help from another person taking care of personal  grooming?: None Help from another person toileting, which includes using toliet, bedpan, or urinal?: None Help from another person bathing (including washing, rinsing, drying)?: None Help from another person to put on and taking off regular upper body clothing?: None Help from another person to put on and taking off regular lower body clothing?: None 6 Click Score: 24   End of Session Nurse Communication: Mobility status  Activity Tolerance: Patient tolerated treatment well Patient left: with call bell/phone within reach;Other (comment)(with PT )  OT Visit Diagnosis: Unsteadiness on feet (R26.81)                Time: 3664-4034 OT Time Calculation (min): 8 min Charges:  OT General Charges $OT Visit: 1 Visit OT Evaluation $OT Eval Low Complexity: 1 Low  Barry Brunner, OT Acute Rehabilitation Services Pager [phone] Office [phone]   Chancy Milroy 08/02/2019, 1:16 PM

## 2019-08-02 NOTE — Evaluation (Signed)
Physical Therapy Evaluation Patient Details Name: Dean Howard MRN: [id number] DOB: Jan 14, 1966 Today's Date: 08/02/2019   History of Present Illness  Pt is a 54 y.o. male admitted 07/26/19 after acetaminophen overdose for apparent suicidal attempt. PMH includesCAD,  MDD, polysubstance abuse.    Clinical Impression  Patient evaluated by Physical Therapy with no further acute PT needs identified. PTA, pt independent and lives with mother. Today, pt independent with mobility and ADL tasks. Score of 23/24 on Dynamic Gait Index does not indicate fall risk with higher level balance activities. All education has been completed and the patient has no further questions. Acute PT is signing off. Thank you for this referral.    Follow Up Recommendations No PT follow up(Behavioral Health)    Equipment Recommendations  None recommended by PT    Recommendations for Other Services       Precautions / Restrictions Precautions Precautions: None Restrictions Weight Bearing Restrictions: No      Mobility  Bed Mobility Overal bed mobility: Independent                Transfers Overall transfer level: Independent                  Ambulation/Gait Ambulation/Gait assistance: Independent Gait Distance (Feet): 400 Feet Assistive device: None Gait Pattern/deviations: WFL(Within Functional Limits)   Gait velocity interpretation: >2.62 ft/sec, indicative of community ambulatory    Stairs            Wheelchair Mobility    Modified Rankin (Stroke Patients Only)       Balance Overall balance assessment: Needs assistance;Independent                               Standardized Balance Assessment Standardized Balance Assessment : Dynamic Gait Index   Dynamic Gait Index Level Surface: Normal Change in Gait Speed: Normal Gait with Horizontal Head Turns: Mild Impairment Gait with Vertical Head Turns: Normal Gait and Pivot Turn: Normal Step Over Obstacle:  Normal Step Around Obstacles: Normal Steps: Normal Total Score: 23       Pertinent Vitals/Pain Pain Assessment: Faces Pain Location: Bilateral ankles (chronic) Pain Descriptors / Indicators: Sore Pain Intervention(s): Monitored during session    Home Living Family/patient expects to be discharged to:: Other (Comment) Living Arrangements: Parent;Other relatives               Additional Comments: Plans to d/c to Behavioral Health    Prior Function Level of Independence: Independent               Hand Dominance        Extremity/Trunk Assessment   Upper Extremity Assessment Upper Extremity Assessment: Overall WFL for tasks assessed    Lower Extremity Assessment Lower Extremity Assessment: Overall WFL for tasks assessed(c/o chronic bilateral ankle pain from previous sxs, motion WFL)       Communication   Communication: No difficulties  Cognition Arousal/Alertness: Awake/alert Behavior During Therapy: WFL for tasks assessed/performed Overall Cognitive Status: Within Functional Limits for tasks assessed                                 General Comments: WFL for simple tasks; some poor attention and higher level processing deficits noted; potential baseline developmental delay      General Comments      Exercises     Assessment/Plan  PT Assessment Patent does not need any further PT services  PT Problem List         PT Treatment Interventions      PT Goals (Current goals can be found in the Care Plan section)  Acute Rehab PT Goals PT Goal Formulation: All assessment and education complete, DC therapy    Frequency     Barriers to discharge        Co-evaluation               AM-PAC PT "6 Clicks" Mobility  Outcome Measure Help needed turning from your back to your side while in a flat bed without using bedrails?: None Help needed moving from lying on your back to sitting on the side of a flat bed without using  bedrails?: None Help needed moving to and from a bed to a chair (including a wheelchair)?: None Help needed standing up from a chair using your arms (e.g., wheelchair or bedside chair)?: None Help needed to walk in hospital room?: None Help needed climbing 3-5 steps with a railing? : None 6 Click Score: 24    End of Session   Activity Tolerance: Patient tolerated treatment well Patient left: in bed;with call bell/phone within reach;with nursing/sitter in room Nurse Communication: Mobility status PT Visit Diagnosis: Other abnormalities of gait and mobility (R26.89)    Time: 2482-5003 PT Time Calculation (min) (ACUTE ONLY): 12 min   Charges:   PT Evaluation $PT Eval Low Complexity: 1 Low     Ina Homes, PT, DPT Acute Rehabilitation Services  Pager [phone] Office [phone]  Malachy Chamber 08/02/2019, 1:01 PM

## 2019-08-02 NOTE — Discharge Summary (Addendum)
Physician Discharge Summary  Dean Howard MAU:[id number] DOB: Sep 16, 1965 DOA: 07/26/2019  PCP: Patient, No Pcp Per  Admit date: 07/26/2019 Discharge date: 08/02/2019  Admitted From: Home  Disposition:  Inpatient psych unit  Recommendations for Outpatient Follow-up and new medication changes:  1. Follow up with Primary Care in 7 days.  2. Alcohol consumption cessation.   Home Health: na   Equipment/Devices: na    Discharge Condition: stable  CODE STATUS: full  Diet recommendation: regular.    Brief/Interim Summary:  Patient was admitted to the hospital with working diagnosis of acetaminophen/ ibuprofen overdose (suicidal attempt) complicated by thrombocytopenia.   54 year old male presented with a suicidal attempt.  He does have significant past medical history for coronary artery disease, and depression with prior suicide attempts.  Apparently patient took a half a bottle of unknown number of 200 mg ibuprofen and 10 tablets of 325 mg Tylenol, the following morning he took another 20 tablets of 200 mg ibuprofen.  He developed sharp epigastric nonradiating abdominal pain. On his initial physical examination his blood pressure was 144/85, pulse rate 67, respiratory rate 19, temperature 98.4, oxygen saturation 100%.  His lungs were clear to auscultation bilaterally, heart S1-S2 present rhythm, soft abdomen and lower extremity edema, patient was neurologically nonfocal. Sodium 137, potassium 3.8, chloride 103, bicarb 24, glucose 112, BUN 15, creatinine 1.48, AST 443, ALT 201, white count 5.4, hemoglobin 13.0, hematocrit 38.9, platelets less than 5.  SARS COVID-19 negative.  Urinalysis negative for infection.  Urine drug screen alcohol less than 10, salicylate less than 7, positive for tetrahydrocannabinol.  His chest radiograph had no infiltrates.  EKG 76 bpm, left axis deviation, left anterior fascicular block, manually corrected QTC 494, sinus rhythm, no ST segment changes, inferior T wave  inversions.  Patient was admitted to the progressive care unit, he received supportive medical therapy with intravenous fluids and N-acetylcysteine.  He required platelet transfusion for thrombocytopenia.  His liver function and cell count improved.  Patient was evaluated by psychiatry with recommendations for inpatient psych follow-up.   1.  Acute acetaminophen and ibuprofen intoxication/overdose, suicidal attempt.  Patient was admitted to the progressive care unit, he had frequent neuro checks, supportive medical therapy with intravenous fluids and N-acetylcysteine per protocol.  Patient had no encephalopathy blood coagulopathy with a peak INR of 3.9.  He had a close follow-up office liver enzymes.  Peak AST 9000 140, peak ALT 5567 with a peak total bilirubin of 2.6.  At discharge AST 112, ALT 1062, total bilirubin is 1.6, INR 1.1. \  2.  Acute coagulopathy, thrombocytopenia and anemia.  Patient had multifactorial marrow failure, likely related to underlying liver disease and significant history alcoholism.  He received supportive medical therapy with FFP, vitamin K and 1 pull of platelet transfusion.  No active bleeding.  Discharge white cell count is 5.0, hemoglobin 10.7, hematocrit 31.0, platelets 163.  3.  History of alcohol abuse.  Patient had frequent neuro checks, no signs of active withdrawal.  4.  Gastritis.  Patient was placed on antiacid therapy with pantoprazole.  5.  Depression with suicidal attempt.  Patient was placed on suicide precautions, one-to-one sitter, psychiatry was consulted with recommendations for inpatient psych follow-up.  Discharge Diagnoses:  Principal Problem:   Overdose by acetaminophen Active Problems:   MDD (major depressive disorder), recurrent, severe, with psychosis (HCC)   Thrombocytopenia (HCC)   Suicidal behavior with attempted self-injury (HCC)   LFT elevation   Pancytopenia (HCC)   Normocytic anemia  Discharge Instructions  Discharge  Instructions    Diet - low sodium heart healthy   Complete by: As directed    Discharge instructions   Complete by: As directed    Please follow with primary care in 7 days.   Increase activity slowly   Complete by: As directed      Allergies as of 08/02/2019   No Known Allergies     Medication List    STOP taking these medications   Cariprazine HCl 4.5 MG Caps   lithium carbonate 450 MG CR tablet Commonly known as: ESKALITH   methocarbamol 750 MG tablet Commonly known as: ROBAXIN   temazepam 30 MG capsule Commonly known as: RESTORIL   traZODone 100 MG tablet Commonly known as: DESYREL     TAKE these medications   pantoprazole 40 MG tablet Commonly known as: PROTONIX Take 1 tablet (40 mg total) by mouth daily. Start taking on: August 03, 2019       No Known Allergies  Consultations:  GI   Oncology   Psychiatry   Procedures/Studies: DG Chest Port 1 View  Result Date: 07/26/2019 CLINICAL DATA:  Intentional drug overdose. EXAM: PORTABLE CHEST 1 VIEW COMPARISON:  June 20, 2019. FINDINGS: The heart size and mediastinal contours are within normal limits. Both lungs are clear. The visualized skeletal structures are unremarkable. IMPRESSION: No active disease. Electronically Signed   By: Lupita Raider M.D.   On: 07/26/2019 12:44   US Abdomen Limited RUQ  Result Date: 07/26/2019 CLINICAL DATA:  LFT elevation EXAM: ULTRASOUND ABDOMEN LIMITED RIGHT UPPER QUADRANT COMPARISON:  None. FINDINGS: Gallbladder: No gallstones or wall thickening visualized. No sonographic Murphy sign noted by sonographer. Common bile duct: Diameter: 5 mm Liver: Increased echotexture seen throughout. No focal abnormality or biliary ductal dilatation. Portal vein is patent on color Doppler imaging with normal direction of blood flow towards the liver. Other: None. IMPRESSION: Mild hepatic steatosis.  Normal appearing gallbladder. Electronically Signed   By: Jonna Clark M.D.   On: 07/26/2019  19:02       Subjective: Patient is feeling better, no nausea or vomiting, no chest pain or dyspnea. Tolerating po well.   Discharge Exam: Vitals:   08/01/19 2242 08/02/19 0838  BP: 131/87 120/74  Pulse: (!) 50 75  Resp: 16 16  Temp: 98 F (36.7 C) 98.4 F (36.9 C)  SpO2: 100% 100%   Vitals:   08/01/19 0750 08/01/19 0800 08/01/19 2242 08/02/19 0838  BP:  (!) 148/94 131/87 120/74  Pulse: 66 (!) 25 (!) 50 75  Resp: 19 (!) 25 16 16   Temp:  98.7 F (37.1 C) 98 F (36.7 C) 98.4 F (36.9 C)  TempSrc:  Oral    SpO2: 100% 98% 100% 100%  Weight:      Height:        General: Not in pain or dyspnea.  Neurology: Awake and alert, non focal  E ENT: no pallor, no icterus, oral mucosa moist Cardiovascular: No JVD. S1-S2 present, rhythmic, no gallops, rubs, or murmurs. No lower extremity edema. Pulmonary: positive breath sounds bilaterally, adequate air movement, no wheezing, rhonchi or rales. Gastrointestinal. Abdomen with no organomegaly, non tender, no rebound or guarding Skin. No rashes Musculoskeletal: no joint deformities   The results of significant diagnostics from this hospitalization (including imaging, microbiology, ancillary and laboratory) are listed below for reference.     Microbiology: Recent Results (from the past 240 hour(s))  Respiratory Panel by RT PCR (Flu A&B, Covid) - Nasopharyngeal  Swab     Status: None   Collection Time: 07/26/19 12:00 PM   Specimen: Nasopharyngeal Swab  Result Value Ref Range Status   SARS Coronavirus 2 by RT PCR NEGATIVE NEGATIVE Final    Comment: (NOTE) SARS-CoV-2 target nucleic acids are NOT DETECTED. The SARS-CoV-2 RNA is generally detectable in upper respiratoy specimens during the acute phase of infection. The lowest concentration of SARS-CoV-2 viral copies this assay can detect is 131 copies/mL. A negative result does not preclude SARS-Cov-2 infection and should not be used as the sole basis for treatment or other patient  management decisions. A negative result may occur with  improper specimen collection/handling, submission of specimen other than nasopharyngeal swab, presence of viral mutation(s) within the areas targeted by this assay, and inadequate number of viral copies (<131 copies/mL). A negative result must be combined with clinical observations, patient history, and epidemiological information. The expected result is Negative. Fact Sheet for Patients:  https://www.moore.com/https://www.fda.gov/media/142436/download Fact Sheet for Healthcare Providers:  https://www.young.biz/https://www.fda.gov/media/142435/download This test is not yet ap proved or cleared by the [place] FDA and  has been authorized for detection and/or diagnosis of SARS-CoV-2 by FDA under an Emergency Use Authorization (EUA). This EUA will remain  in effect (meaning this test can be used) for the duration of the COVID-19 declaration under Section 564(b)(1) of the Act, 21 U.S.C. section 360bbb-3(b)(1), unless the authorization is terminated or revoked sooner.    Influenza A by PCR NEGATIVE NEGATIVE Final   Influenza B by PCR NEGATIVE NEGATIVE Final    Comment: (NOTE) The Xpert Xpress SARS-CoV-2/FLU/RSV assay is intended as an aid in  the diagnosis of influenza from Nasopharyngeal swab specimens and  should not be used as a sole basis for treatment. Nasal washings and  aspirates are unacceptable for Xpert Xpress SARS-CoV-2/FLU/RSV  testing. Fact Sheet for Patients: https://www.moore.com/https://www.fda.gov/media/142436/download Fact Sheet for Healthcare Providers: https://www.young.biz/https://www.fda.gov/media/142435/download This test is not yet approved or cleared by the [place] FDA and  has been authorized for detection and/or diagnosis of SARS-CoV-2 by  FDA under an Emergency Use Authorization (EUA). This EUA will remain  in effect (meaning this test can be used) for the duration of the  Covid-19 declaration under Section 564(b)(1) of the Act, 21  U.S.C. section 360bbb-3(b)(1), unless the  authorization is  terminated or revoked. Performed at [facility], [address] [place], [place] [address]   MRSA PCR Screening     Status: None   Collection Time: 07/26/19  9:28 PM   Specimen: Nasopharyngeal  Result Value Ref Range Status   MRSA by PCR NEGATIVE NEGATIVE Final    Comment:        The GeneXpert MRSA Assay (FDA approved for NASAL specimens only), is one component of a comprehensive MRSA colonization surveillance program. It is not intended to diagnose MRSA infection nor to guide or monitor treatment for MRSA infections. Performed at [facility], [address] [place], [place] [address]      Labs: BNP (last 3 results) No results for input(s): BNP in the last 8760 hours. Basic Metabolic Panel: Recent Labs  Lab 07/26/19 1008 07/26/19 1500 07/26/19 1730 07/26/19 2009 07/28/19 0608 07/28/19 1646 07/29/19 1623 07/30/19 0323 07/31/19 0545 08/01/19 0317 08/02/19 0230  NA 137   < >  --    < > 138   < > 136 136 136 141 139  K 3.8   < >  --    < > 3.5   < > 3.2*  3.0* 3.5 3.6 3.7  CL 103   < >  --    < > 109   < > 107 107 106 101 105  CO2 24   < >  --    < > 20*   < > 21* 20* 23 26 26   GLUCOSE 112*   < >  --    < > 82   < > 103* 97 88 84 89  BUN 15   < >  --    < > <5*   < > <5* <5* <5* 5* 9  CREATININE 1.48*   < >  --    < > 1.07   < > 1.02 1.06 1.03 1.07 1.32*  CALCIUM 9.2   < >  --    < > 8.0*   < > 8.5* 8.5* 8.7* 9.3 9.2  MG 2.1  --  2.2  --  1.8  --   --   --   --   --   --    < > = values in this interval not displayed.   Liver Function Tests: Recent Labs  Lab 07/29/19 1623 07/30/19 0323 07/31/19 0545 08/01/19 0317 08/02/19 0230  AST 3,065* 1,463* 409* 208* 112*  ALT 3,301* 2,980* 1,740* 1,372* 1,062*  ALKPHOS 74 67 66 70 71  BILITOT 2.3* 2.1* 1.8* 2.1* 1.6*  PROT 5.0* 5.3* 5.4* 5.9* 6.1*  ALBUMIN 2.5* 2.6* 2.7* 3.0* 3.0*   No results for input(s): LIPASE, AMYLASE in the last 168 hours. Recent Labs  Lab 07/26/19 1500   AMMONIA 16   CBC: Recent Labs  Lab 07/27/19 0637 07/27/19 0637 07/27/19 1649 07/27/19 1649 07/28/19 0608 07/28/19 0608 07/28/19 1646 07/28/19 1646 07/29/19 0245 07/30/19 0323 07/31/19 0545 08/01/19 0317 08/02/19 0230  WBC 2.9*   < > 2.8*   < > 2.6*   < > 3.1*   < > 2.8* 3.2* 4.8 5.7 5.0  NEUTROABS 1.7  --  1.9  --  1.7  --  2.2  --  1.6*  --   --   --   --   HGB 11.5*   < > 11.6*   < > 11.2*   < > 10.8*   < > 10.1* 10.3* 10.4* 10.4* 10.7*  HCT 33.5*   < > 33.4*   < > 32.7*   < > 30.5*   < > 28.7* 29.7* 29.3* 29.5* 31.0*  MCV 90.3   < > 88.8   < > 90.6   < > 88.4   < > 88.3 88.9 87.7 89.1 90.9  PLT <5*   < > 6*   < > 6*   < > 22*   < > 28* 58* 101* 143* 163   < > = values in this interval not displayed.   Cardiac Enzymes: Recent Labs  Lab 07/26/19 1730  CKTOTAL 125   BNP: Invalid input(s): POCBNP CBG: No results for input(s): GLUCAP in the last 168 hours. D-Dimer No results for input(s): DDIMER in the last 72 hours. Hgb A1c No results for input(s): HGBA1C in the last 72 hours. Lipid Profile No results for input(s): CHOL, HDL, LDLCALC, TRIG, CHOLHDL, LDLDIRECT in the last 72 hours. Thyroid function studies No results for input(s): TSH, T4TOTAL, T3FREE, THYROIDAB in the last 72 hours.  Invalid input(s): FREET3 Anemia work up No results for input(s): VITAMINB12, FOLATE, FERRITIN, TIBC, IRON, RETICCTPCT in the last 72 hours. Urinalysis    Component Value Date/Time   COLORURINE  YELLOW 07/26/2019 [place] 07/26/2019 1519   LABSPEC 1.026 07/26/2019 1519   PHURINE 5.0 07/26/2019 1519   GLUCOSEU NEGATIVE 07/26/2019 1519   HGBUR NEGATIVE 07/26/2019 1519   BILIRUBINUR NEGATIVE 07/26/2019 1519   KETONESUR 80 (A) 07/26/2019 1519   PROTEINUR NEGATIVE 07/26/2019 1519   UROBILINOGEN 1.0 09/02/2007 1900   NITRITE NEGATIVE 07/26/2019 1519   LEUKOCYTESUR NEGATIVE 07/26/2019 1519   Sepsis Labs Invalid input(s): PROCALCITONIN,  WBC,   LACTICIDVEN Microbiology Recent Results (from the past 240 hour(s))  Respiratory Panel by RT PCR (Flu A&B, Covid) - Nasopharyngeal Swab     Status: None   Collection Time: 07/26/19 12:00 PM   Specimen: Nasopharyngeal Swab  Result Value Ref Range Status   SARS Coronavirus 2 by RT PCR NEGATIVE NEGATIVE Final    Comment: (NOTE) SARS-CoV-2 target nucleic acids are NOT DETECTED. The SARS-CoV-2 RNA is generally detectable in upper respiratoy specimens during the acute phase of infection. The lowest concentration of SARS-CoV-2 viral copies this assay can detect is 131 copies/mL. A negative result does not preclude SARS-Cov-2 infection and should not be used as the sole basis for treatment or other patient management decisions. A negative result may occur with  improper specimen collection/handling, submission of specimen other than nasopharyngeal swab, presence of viral mutation(s) within the areas targeted by this assay, and inadequate number of viral copies (<131 copies/mL). A negative result must be combined with clinical observations, patient history, and epidemiological information. The expected result is Negative. Fact Sheet for Patients:  PinkCheek.be Fact Sheet for Healthcare Providers:  GravelBags.it This test is not yet ap proved or cleared by the [place] FDA and  has been authorized for detection and/or diagnosis of SARS-CoV-2 by FDA under an Emergency Use Authorization (EUA). This EUA will remain  in effect (meaning this test can be used) for the duration of the COVID-19 declaration under Section 564(b)(1) of the Act, 21 U.S.C. section 360bbb-3(b)(1), unless the authorization is terminated or revoked sooner.    Influenza A by PCR NEGATIVE NEGATIVE Final   Influenza B by PCR NEGATIVE NEGATIVE Final    Comment: (NOTE) The Xpert Xpress SARS-CoV-2/FLU/RSV assay is intended as an aid in  the diagnosis of influenza  from Nasopharyngeal swab specimens and  should not be used as a sole basis for treatment. Nasal washings and  aspirates are unacceptable for Xpert Xpress SARS-CoV-2/FLU/RSV  testing. Fact Sheet for Patients: PinkCheek.be Fact Sheet for Healthcare Providers: GravelBags.it This test is not yet approved or cleared by the [place] FDA and  has been authorized for detection and/or diagnosis of SARS-CoV-2 by  FDA under an Emergency Use Authorization (EUA). This EUA will remain  in effect (meaning this test can be used) for the duration of the  Covid-19 declaration under Section 564(b)(1) of the Act, 21  U.S.C. section 360bbb-3(b)(1), unless the authorization is  terminated or revoked. Performed at [facility], [place] [address] [place], [place] [address]   MRSA PCR Screening     Status: None   Collection Time: 07/26/19  9:28 PM   Specimen: Nasopharyngeal  Result Value Ref Range Status   MRSA by PCR NEGATIVE NEGATIVE Final    Comment:        The GeneXpert MRSA Assay (FDA approved for NASAL specimens only), is one component of a comprehensive MRSA colonization surveillance program. It is not intended to diagnose MRSA infection nor to guide or monitor treatment for MRSA infections. Performed at [facility],  [address], [place], [place] [address]      Time coordinating discharge: 45 minutes  SIGNED:   Coralie Keens, MD  [facility] 08/02/2019, 8:46 AM

## 2019-08-02 NOTE — Plan of Care (Signed)
  Problem: Education: Goal: Knowledge of General Education information will improve Description: Including pain rating scale, medication(s)/side effects and non-pharmacologic comfort measures Outcome: Progressing   Problem: Health Behavior/Discharge Planning: Goal: Ability to manage health-related needs will improve Outcome: Progressing   Problem: Clinical Measurements: Goal: Diagnostic test results will improve Outcome: Progressing   Problem: Activity: Goal: Risk for activity intolerance will decrease Outcome: Progressing   Problem: Coping: Goal: Level of anxiety will decrease Outcome: Progressing   Problem: Safety: Goal: Ability to remain free from injury will improve Outcome: Progressing   

## 2019-08-02 NOTE — Progress Notes (Addendum)
[facility] requesting an updated psych consult. No beds available. Faxed referral to [facility] [address] and [facility].   Osborne Casco Aixa Corsello LCSW [phone]

## 2019-08-02 NOTE — Consult Note (Signed)
Telepsych Consultation   Reason for Consult:  Overdose Referring Physician:  Internal Medicine  Location of Patient: 2W12C Location of Provider: [facility]  Patient Identification: Dean Howard MRN:  [id number] Principal Diagnosis: Overdose by acetaminophen Diagnosis:  Principal Problem:   Overdose by acetaminophen Active Problems:   MDD (major depressive disorder), recurrent, severe, with psychosis (HCC)   Thrombocytopenia (HCC)   Suicidal behavior with attempted self-injury (HCC)   LFT elevation   Pancytopenia (HCC)   Normocytic anemia   Total Time spent with patient: 15 minutes  Subjective:   Dean Howard is a 54 y.o. male was evaluated via teleassessment.  He is awake, alert and oriented x3.  Continues to endorse passive suicidal ideations due to multiple stressors.  Reported he is awaiting his disability and feels like he has been lie too and has not heard anything as of yet.  Reports he has been off his medications for the past 4 months.  States he was prescribed Lithium, Vraylar and trazodone.  Patient reports he has attempted to follow-up with [facility] however due to Covid the health programs have closed.  States he was referred to [facility] health and wellness without any success.  Continues to report irritability, depression and  explosive mood. Patient stated  " I know they are after me."   Reports auditory hallucinations.  NP will continue to recommend inpatient admission after medical clearance. Chart reviewed AST/ ALT  112/1062 downward trend  Case staffed with attending psychiatrist.  Support,encouragement and reassurance was provided.  HPI: Per admission assessment note: Dean Howard is a 54 y.o. male patient admitted with intentional overdose.  Patient assessed by nurse practitioner.  Patient alert and oriented, answers appropriately.  Patient states "I had not been able to get my meds for 4 months and I am constantly throwing up and dizzy  also Covid pushed back my disability hearing from last year to this 11/02/2022."Patient reports intentional overdose on aspirin and a suicide attempt.  Patient currently endorses suicidal ideations without plan or intent.  Patient denies homicidal ideations.  Patient reports auditory hallucinations states, "I hear people coming after me."  Patient denies visual hallucinations.Patient seen outpatient by Dr. Sheppard Penton at [facility].  Patient reports did not attend January 18 appointment as he was "having trouble with my phone."  Patient reports neck scheduled appointment in March 2021. Patient reports he lives with his mother and sister.  Patient denies access to weapons.  Patient reports substance use including 12-18 beers per day x4 years and 4 marijuana cigarettes per day x4 years.  Patient reports alcohol and marijuana use since father's death in 02-Nov-2015     Past Psychiatric History:   Risk to Self:   Risk to Others:   Prior Inpatient Therapy:   Prior Outpatient Therapy:    Past Medical History:  Past Medical History:  Diagnosis Date  . Coronary artery disease   . MI, old   . Tick fever     Past Surgical History:  Procedure Laterality Date  . HERNIA REPAIR    . KNEE SURGERY     Family History: History reviewed. No pertinent family history. Family Psychiatric  History:  Social History:  Social History   Substance and Sexual Activity  Alcohol Use Yes   Comment: Daily      Social History   Substance and Sexual Activity  Drug Use Yes  . Types: Marijuana   Comment: Daily     Social History   Socioeconomic History  .  Marital status: Single    Spouse name: Not on file  . Number of children: Not on file  . Years of education: Not on file  . Highest education level: Not on file  Occupational History  . Not on file  Tobacco Use  . Smoking status: Current Every Day Smoker    Types: Cigars    Last attempt to quit: 03/04/2016    Years since quitting: 3.4  . Smokeless tobacco: Never Used   Substance and Sexual Activity  . Alcohol use: Yes    Comment: Daily   . Drug use: Yes    Types: Marijuana    Comment: Daily   . Sexual activity: Yes  Other Topics Concern  . Not on file  Social History Narrative  . Not on file   Social Determinants of Health   Financial Resource Strain:   . Difficulty of Paying Living Expenses: Not on file  Food Insecurity:   . Worried About Programme researcher, broadcasting/film/video in the Last Year: Not on file  . Ran Out of Food in the Last Year: Not on file  Transportation Needs:   . Lack of Transportation (Medical): Not on file  . Lack of Transportation (Non-Medical): Not on file  Physical Activity:   . Days of Exercise per Week: Not on file  . Minutes of Exercise per Session: Not on file  Stress:   . Feeling of Stress : Not on file  Social Connections:   . Frequency of Communication with Friends and Family: Not on file  . Frequency of Social Gatherings with Friends and Family: Not on file  . Attends Religious Services: Not on file  . Active Member of Clubs or Organizations: Not on file  . Attends Banker Meetings: Not on file  . Marital Status: Not on file   Additional Social History:    Allergies:  No Known Allergies  Labs:  Results for orders placed or performed during the hospital encounter of 07/26/19 (from the past 48 hour(s))  Comprehensive metabolic panel     Status: Abnormal   Collection Time: 08/01/19  3:17 AM  Result Value Ref Range   Sodium 141 135 - 145 mmol/L   Potassium 3.6 3.5 - 5.1 mmol/L   Chloride 101 98 - 111 mmol/L   CO2 26 22 - 32 mmol/L   Glucose, Bld 84 70 - 99 mg/dL   BUN 5 (L) 6 - 20 mg/dL   Creatinine, Ser 2.22 0.61 - 1.24 mg/dL   Calcium 9.3 8.9 - 97.9 mg/dL   Total Protein 5.9 (L) 6.5 - 8.1 g/dL   Albumin 3.0 (L) 3.5 - 5.0 g/dL   AST 892 (H) 15 - 41 U/L   ALT 1,372 (H) 0 - 44 U/L   Alkaline Phosphatase 70 38 - 126 U/L   Total Bilirubin 2.1 (H) 0.3 - 1.2 mg/dL   GFR calc non Af Amer >60 >60 mL/min    GFR calc Af Amer >60 >60 mL/min   Anion gap 14 5 - 15    Comment: Performed at [facility], [address], [place], [place] [address]  CBC     Status: Abnormal   Collection Time: 08/01/19  3:17 AM  Result Value Ref Range   WBC 5.7 4.0 - 10.5 K/uL   RBC 3.31 (L) 4.22 - 5.81 MIL/uL   Hemoglobin 10.4 (L) 13.0 - 17.0 g/dL   HCT 74.0 (L) 81.4 - 48.1 %   MCV 89.1 80.0 - 100.0  fL   MCH 31.4 26.0 - 34.0 pg   MCHC 35.3 30.0 - 36.0 g/dL   RDW 27.0 62.3 - 76.2 %   Platelets 143 (L) 150 - 400 K/uL   nRBC 0.0 0.0 - 0.2 %    Comment: Performed at [facility], [address], [place], [place] [address]  Protime-INR     Status: None   Collection Time: 08/01/19  3:17 AM  Result Value Ref Range   Prothrombin Time 15.0 11.4 - 15.2 seconds   INR 1.2 0.8 - 1.2    Comment: (NOTE) INR goal varies based on device and disease states. Performed at [facility], [address], [place], [place] [address]   Comprehensive metabolic panel     Status: Abnormal   Collection Time: 08/02/19  2:30 AM  Result Value Ref Range   Sodium 139 135 - 145 mmol/L   Potassium 3.7 3.5 - 5.1 mmol/L   Chloride 105 98 - 111 mmol/L   CO2 26 22 - 32 mmol/L   Glucose, Bld 89 70 - 99 mg/dL   BUN 9 6 - 20 mg/dL   Creatinine, Ser 7.37 (H) 0.61 - 1.24 mg/dL   Calcium 9.2 8.9 - 10.6 mg/dL   Total Protein 6.1 (L) 6.5 - 8.1 g/dL   Albumin 3.0 (L) 3.5 - 5.0 g/dL   AST 269 (H) 15 - 41 U/L   ALT 1,062 (H) 0 - 44 U/L   Alkaline Phosphatase 71 38 - 126 U/L   Total Bilirubin 1.6 (H) 0.3 - 1.2 mg/dL   GFR calc non Af Amer >60 >60 mL/min   GFR calc Af Amer >60 >60 mL/min   Anion gap 8 5 - 15    Comment: Performed at [facility], [address], [place], [place] [address]  CBC     Status: Abnormal   Collection Time: 08/02/19  2:30 AM  Result Value Ref Range   WBC 5.0 4.0 - 10.5 K/uL   RBC 3.41 (L) 4.22 - 5.81 MIL/uL   Hemoglobin 10.7 (L) 13.0 - 17.0 g/dL   HCT 27.0 (L) 35.0 - 09.3 %   MCV 90.9 80.0 -  100.0 fL   MCH 31.4 26.0 - 34.0 pg   MCHC 34.5 30.0 - 36.0 g/dL   RDW 81.8 (H) 29.9 - 37.1 %   Platelets 163 150 - 400 K/uL   nRBC 0.0 0.0 - 0.2 %    Comment: Performed at [facility], [address], [place], [place] [address]  Protime-INR     Status: None   Collection Time: 08/02/19  2:30 AM  Result Value Ref Range   Prothrombin Time 14.1 11.4 - 15.2 seconds   INR 1.1 0.8 - 1.2    Comment: (NOTE) INR goal varies based on device and disease states. Performed at [facility], [address], [place], [place] [address]     Medications:  Current Facility-Administered Medications  Medication Dose Route Frequency Provider Last Rate Last Admin  . ammonium lactate (LAC-HYDRIN) 12 % lotion   Topical TID Arrien, York Ram, MD      . hydrALAZINE (APRESOLINE) tablet 25 mg  25 mg Oral Q6H PRN Emeline General, MD   25 mg at 07/31/19 0313  . ondansetron (ZOFRAN) injection 4 mg  4 mg Intravenous Once Emeline General, MD   Stopped at 07/26/19 1432  . pantoprazole (PROTONIX) EC tablet 40 mg  40 mg Oral Daily Arrien,  Jimmy Picket, MD   40 mg at 08/02/19 0848  . scopolamine (TRANSDERM-SCOP) 1 MG/3DAYS 1.5 mg  1 patch Transdermal Q72H Georgette Shell, MD   1.5 mg at 08/02/19 0017    Musculoskeletal:   Psychiatric Specialty Exam: Physical Exam  Psychiatric: He has a normal mood and affect. His behavior is normal.    Review of Systems  Psychiatric/Behavioral: Positive for agitation, sleep disturbance and suicidal ideas.  All other systems reviewed and are negative.   Blood pressure 120/74, pulse 75, temperature 98.4 F (36.9 C), resp. rate 16, height 6\' 3"  (1.905 m), weight 69.7 kg, SpO2 100 %.Body mass index is 19.21 kg/m.  General Appearance: Casual  Eye Contact:  Fair  Speech:  Clear and Coherent  Volume:  Normal  Mood:  Anxious and Depressed  Affect:  Depressed and Labile  Thought Process:  Coherent  Orientation:  Full (Time, Place, and Person)  Thought  Content:  Hallucinations: Auditory None, Paranoid Ideation and Rumination  Suicidal Thoughts:  Yes.  with intent/plan  Homicidal Thoughts:  No  Memory:  Immediate;   Fair Recent;   Fair  Judgement:  Fair  Insight:  Fair  Psychomotor Activity:  Normal  Concentration:  Concentration: Fair  Recall:  [employer] of Knowledge:  Fair  Language:  Fair  Akathisia:  No  Handed:  Right  AIMS (if indicated):     Assets:  Communication Skills Desire for Improvement Social Support  ADL's:  Intact  Cognition:  WNL  Sleep:        Disposition: Recommend psychiatric Inpatient admission when medically cleared.  This service was provided via telemedicine using a 2-way, interactive audio and video technology.  Names of all persons participating in this telemedicine service and their role in this encounter. Name: Vaishnav Demartin Role: patient   Name: T.Fletcher Rathbun Role: NP          Derrill Center, NP 08/02/2019 10:56 AM

## 2019-08-03 ENCOUNTER — Inpatient Hospital Stay
Admission: RE | Admit: 2019-08-03 | Discharge: 2019-08-05 | DRG: 885 | Disposition: A | Discharge status: Home / Self Care | Payer: Medicaid Other | Source: Intra-hospital | Attending: Psychiatry | Admitting: Psychiatry

## 2019-08-03 ENCOUNTER — Encounter: Payer: Self-pay | Admitting: Psychiatry

## 2019-08-03 ENCOUNTER — Other Ambulatory Visit: Payer: Self-pay

## 2019-08-03 DIAGNOSIS — F1290 Cannabis use, unspecified, uncomplicated: Secondary | ICD-10-CM | POA: Diagnosis present

## 2019-08-03 DIAGNOSIS — F1010 Alcohol abuse, uncomplicated: Secondary | ICD-10-CM | POA: Diagnosis present

## 2019-08-03 DIAGNOSIS — D696 Thrombocytopenia, unspecified: Secondary | ICD-10-CM | POA: Diagnosis present

## 2019-08-03 DIAGNOSIS — T391X2A Poisoning by 4-Aminophenol derivatives, intentional self-harm, initial encounter: Secondary | ICD-10-CM | POA: Diagnosis present

## 2019-08-03 DIAGNOSIS — F259 Schizoaffective disorder, unspecified: Secondary | ICD-10-CM | POA: Diagnosis present

## 2019-08-03 DIAGNOSIS — S36118A Other injury of liver, initial encounter: Secondary | ICD-10-CM | POA: Diagnosis present

## 2019-08-03 DIAGNOSIS — G4700 Insomnia, unspecified: Secondary | ICD-10-CM | POA: Diagnosis present

## 2019-08-03 DIAGNOSIS — R7989 Other specified abnormal findings of blood chemistry: Secondary | ICD-10-CM | POA: Diagnosis present

## 2019-08-03 DIAGNOSIS — F17290 Nicotine dependence, other tobacco product, uncomplicated: Secondary | ICD-10-CM | POA: Diagnosis present

## 2019-08-03 DIAGNOSIS — Y900 Blood alcohol level of less than 20 mg/100 ml: Secondary | ICD-10-CM | POA: Diagnosis present

## 2019-08-03 DIAGNOSIS — I252 Old myocardial infarction: Secondary | ICD-10-CM

## 2019-08-03 DIAGNOSIS — K719 Toxic liver disease, unspecified: Secondary | ICD-10-CM

## 2019-08-03 DIAGNOSIS — F419 Anxiety disorder, unspecified: Secondary | ICD-10-CM | POA: Diagnosis present

## 2019-08-03 DIAGNOSIS — Z7289 Other problems related to lifestyle: Secondary | ICD-10-CM | POA: Diagnosis not present

## 2019-08-03 DIAGNOSIS — I2510 Atherosclerotic heart disease of native coronary artery without angina pectoris: Secondary | ICD-10-CM | POA: Diagnosis present

## 2019-08-03 DIAGNOSIS — F332 Major depressive disorder, recurrent severe without psychotic features: Secondary | ICD-10-CM | POA: Diagnosis present

## 2019-08-03 LAB — SARS CORONAVIRUS 2 (TAT 6-24 HRS): SARS Coronavirus 2: NEGATIVE

## 2019-08-03 MED ORDER — TRAZODONE HCL 50 MG PO TABS
50.0000 mg | ORAL_TABLET | Freq: Every evening | ORAL | Status: DC | PRN
  Administered 2019-08-03: 50 mg via ORAL
  Filled 2019-08-03: qty 1

## 2019-08-03 MED ORDER — MAGNESIUM HYDROXIDE 400 MG/5ML PO SUSP: 30.0000 mL | Freq: Every day | ORAL | Status: DC | PRN

## 2019-08-03 MED ORDER — ACETAMINOPHEN 325 MG PO TABS: 650.0000 mg | ORAL_TABLET | Freq: Four times a day (QID) | ORAL | Status: DC | PRN

## 2019-08-03 MED ORDER — ALUM & MAG HYDROXIDE-SIMETH 200-200-20 MG/5ML PO SUSP: 30.0000 mL | ORAL | Status: DC | PRN

## 2019-08-03 NOTE — Tx Team (Signed)
Initial Treatment Plan 08/03/2019 7:22 PM [name] CVU:[id number]    PATIENT STRESSORS: Health problems Marital or family conflict Medication change or noncompliance Substance abuse   PATIENT STRENGTHS: Ability for insight Active sense of humor Capable of independent living Supportive family/friends   PATIENT IDENTIFIED PROBLEMS: Being Bullied  08/03/19  Substances Abuse  08/03/19  Suicidal 08/03/19  Noncompliant Medication 08/03/19               DISCHARGE CRITERIA:  Ability to meet basic life and health needs Adequate post-discharge living arrangements Improved stabilization in mood, thinking, and/or behavior  PRELIMINARY DISCHARGE PLAN: Outpatient therapy Return to previous living arrangement  PATIENT/FAMILY INVOLVEMENT: This treatment plan has been presented to and reviewed with the patient, Dean Howard, and/or family member,  .  The patient and family have been given the opportunity to ask questions and make suggestions.  Crist Infante, RN 08/03/2019, 7:22 PM

## 2019-08-03 NOTE — Plan of Care (Signed)
Patient  instructed on [facility] Education , able to verbalize understanding  Problem: Education: Goal: Knowledge of [facility] Education information/materials will improve Outcome: Not Progressing

## 2019-08-03 NOTE — Progress Notes (Signed)
[facility] still awaiting a bed for patient.   Dean Casco Lanaya Bennis LCSW [phone]

## 2019-08-03 NOTE — Progress Notes (Signed)
Admission Note:  D: Pt appeared depressed  With  a flat affect.  Pt  denies SI / AVH at this time.   54 year old black  Male presented to the unit unit the service of Dr. Toni Amend . Patient had  Intentional overdosed  With asa.  Voiced of being bullied by family. Drinking 12 beers daily . Patient noncompliant with his medication. Voice of having a explosive mood . Marland Kitchen Patient reports auditory hallucinations . Feels people are out to get him.  Patient seen by Dr. Sheppard Penton at [place].  Pt is redirectable and cooperative with assessment.      A: Pt admitted to unit per protocol, skin assessment, skin noted bruising on arms from taking the asa,. and search done with [facility] and no contraband found.  Pt  educated on therapeutic milieu rules. Pt was introduced to milieu by nursing staff.    R: Pt was receptive to education about the milieu .  15 min safety checks started. Clinical research associate offered support

## 2019-08-03 NOTE — Progress Notes (Signed)
CSW made [facility] aware of negative covid result and scheduled pickup through [employer] transportation Acupuncturist). RN aware.   Osborne Casco Jamy Cleckler LCSW [phone]

## 2019-08-03 NOTE — BH Assessment (Signed)
Patient has been accepted to [facility].  Accepted by Nurse Practitioner [facility], on the behalf of Dr. Toni Amend..  Attending  Physician will be Dr. Toni Amend.  Patient has been assigned to room 323, by [facility] Charge Nurse [place]   Call report to [phone].  Representative/Transfer Coordinator is Warden/ranger Patient pre-admitted by [facility] Patient Access Donella Stade.,)   [facility] Staff Osborne Casco, Disposition Social Worker) made aware of acceptance.

## 2019-08-03 NOTE — TOC Progression Note (Signed)
Transition of Care [facility]) - Progression Note    Patient Details  Name: Dean Howard MRN: [id number] Date of Birth: [birth year]  Transition [facility] CM/SW Contact  Mearl Latin, LCSW Phone Number: 08/03/2019, 12:01 PM  Clinical Narrative:    [facility] able to accept patient today. Patient signed voluntary form, though he reports being unhappy about the location being in [place].       Expected Discharge Plan and Services           Expected Discharge Date: 08/03/19                                     Social Determinants of Health (SDOH) Interventions    Readmission Risk Interventions No flowsheet data found.

## 2019-08-03 NOTE — Care Management Important Message (Signed)
Important Message  Patient Details  Name: Dean Howard MRN: [id number] Date of Birth: [birth year]   Medicare Important Message Given:  Yes     [name] 08/03/2019, 1:56 PM

## 2019-08-03 NOTE — Progress Notes (Signed)
Patient seen and examine dthis morning. Discharged due to medical stability on 2/9 and remains stable. He's eating, drinking, having no nausea, diarrhea, constipation, no pain, and in good spirits. Says he needs to go to behavioral health for help "getting back on my medications." He is in no distress, lungs are clear, respirations nonlabored. RRR without gallop, no LE edema, abdomen is nontender with active bowel sounds. He does not appear to be responding to internal stimuli and has linear thought process, no reported AVH.  Will need repeat liver function testing to confirm continued improvement, though he remains medically stable for discharge. Discussed with CSW who is aware. Please see DC summary from 2/9 for full details.   Hazeline Junker, MD 08/03/2019 9:38 AM

## 2019-08-04 DIAGNOSIS — F1010 Alcohol abuse, uncomplicated: Secondary | ICD-10-CM

## 2019-08-04 DIAGNOSIS — F259 Schizoaffective disorder, unspecified: Principal | ICD-10-CM

## 2019-08-04 DIAGNOSIS — K719 Toxic liver disease, unspecified: Secondary | ICD-10-CM

## 2019-08-04 MED ORDER — ENSURE ENLIVE PO LIQD
237.0000 mL | Freq: Three times a day (TID) | ORAL | Status: DC
  Administered 2019-08-04 – 2019-08-05 (×5): 237 mL via ORAL

## 2019-08-04 MED ORDER — CARIPRAZINE HCL 3 MG PO CAPS
4.5000 mg | ORAL_CAPSULE | Freq: Every day | ORAL | Status: DC
  Administered 2019-08-04 – 2019-08-05 (×2): 4.5 mg via ORAL
  Filled 2019-08-04 (×2): qty 1

## 2019-08-04 MED ORDER — ADULT MULTIVITAMIN W/MINERALS CH
1.0000 | ORAL_TABLET | Freq: Every day | ORAL | Status: DC
  Administered 2019-08-05: 1 via ORAL
  Filled 2019-08-04: qty 1

## 2019-08-04 MED ORDER — LITHIUM CARBONATE ER 450 MG PO TBCR
450.0000 mg | EXTENDED_RELEASE_TABLET | Freq: Every day | ORAL | Status: DC
  Administered 2019-08-04: 450 mg via ORAL
  Filled 2019-08-04: qty 1

## 2019-08-04 MED ORDER — TEMAZEPAM 15 MG PO CAPS
15.0000 mg | ORAL_CAPSULE | Freq: Every day | ORAL | Status: DC
  Administered 2019-08-04: 15 mg via ORAL
  Filled 2019-08-04: qty 1

## 2019-08-04 NOTE — [facility] Suicide Risk Assessment (Signed)
[facility] Admission Suicide Risk Assessment   Nursing information obtained from:  Patient Demographic factors:  Male, Unemployed Current Mental Status:  NA Loss Factors:  Financial problems / change in socioeconomic status, Loss of significant relationship Historical Factors:  NA Risk Reduction Factors:  Employed, Positive social support  Total Time spent with patient: 1 hour Principal Problem: Schizo-affective schizophrenia, chronic condition with acute exacerbation (HCC) Diagnosis:  Principal Problem:   Schizo-affective schizophrenia, chronic condition with acute exacerbation (HCC) Active Problems:   Thrombocytopenia (HCC)   LFT elevation   Drug-induced injury of liver  Subjective Data: Patient seen chart reviewed.  Patient transferred from medicine service where he has been treated for the effects of an intentional overdose of acetaminophen.  On interview today the patient denies any suicidal thoughts whatsoever.  Continues to report mood instability mostly irritability not hopelessness or anhedonia.  He denies active hallucinations.  He indicates that he wishes to be cooperative with psychiatric treatment.  Continued Clinical Symptoms:  Alcohol Use Disorder Identification Test Final Score (AUDIT): 35 The "Alcohol Use Disorders Identification Test", Guidelines for Use in Primary Care, Second Edition.  World Science writer [facility]). Score between 0-7:  no or low risk or alcohol related problems. Score between 8-15:  moderate risk of alcohol related problems. Score between 16-19:  high risk of alcohol related problems. Score 20 or above:  warrants further diagnostic evaluation for alcohol dependence and treatment.   CLINICAL FACTORS:   Bipolar Disorder:   Mixed State   Musculoskeletal: Strength & Muscle Tone: within normal limits Gait & Station: normal Patient leans: N/A  Psychiatric Specialty Exam: Physical Exam  Nursing note and vitals reviewed. Constitutional: He appears  well-developed and well-nourished.  HENT:  Head: Normocephalic and atraumatic.  Eyes: Pupils are equal, round, and reactive to light. Conjunctivae are normal.  Cardiovascular: Regular rhythm and normal heart sounds.  Respiratory: Effort normal. No respiratory distress.  GI: Soft.  Musculoskeletal:        General: Normal range of motion.     Cervical back: Normal range of motion.  Neurological: He is alert.  Skin: Skin is warm and dry.  Psychiatric: His affect is labile. His speech is rapid and/or pressured and tangential. He is agitated. He is not aggressive. Thought content is paranoid. Cognition and memory are impaired. He expresses impulsivity. He expresses no homicidal and no suicidal ideation.    Review of Systems  Constitutional: Negative.   HENT: Negative.   Eyes: Negative.   Respiratory: Negative.   Cardiovascular: Negative.   Gastrointestinal: Negative.   Musculoskeletal: Negative.   Skin: Negative.   Neurological: Negative.   Psychiatric/Behavioral: Positive for dysphoric mood and sleep disturbance.    Blood pressure 109/69, pulse 76, temperature 98 F (36.7 C), resp. rate 16, SpO2 100 %.There is no height or weight on file to calculate BMI.  General Appearance: Casual  Eye Contact:  Fair  Speech:  Pressured  Volume:  Increased  Mood:  Irritable  Affect:  Labile  Thought Process:  Disorganized  Orientation:  Full (Time, Place, and Person)  Thought Content:  Paranoid Ideation, Rumination and Tangential  Suicidal Thoughts:  No  Homicidal Thoughts:  No  Memory:  Immediate;   Fair Recent;   Fair Remote;   Fair  Judgement:  Fair  Insight:  Fair  Psychomotor Activity:  Normal  Concentration:  Concentration: Fair  Recall:  [employer] of Knowledge:  Fair  Language:  Fair  Akathisia:  No  Handed:  Right  AIMS (  if indicated):     Assets:  Desire for Improvement Housing Physical Health Social Support  ADL's:  Intact  Cognition:  Impaired,  Mild  Sleep:  Number  of Hours: 7.75      COGNITIVE FEATURES THAT CONTRIBUTE TO RISK:  Thought constriction (tunnel vision)    SUICIDE RISK:   Mild:  Suicidal ideation of limited frequency, intensity, duration, and specificity.  There are no identifiable plans, no associated intent, mild dysphoria and related symptoms, good self-control (both objective and subjective assessment), few other risk factors, and identifiable protective factors, including available and accessible social support.  PLAN OF CARE: Continue 15-minute checks.  Restart appropriate medicines for bipolar depression.  Engage in individual and group therapy.  Follow-up lab tests for his medical complications.  Work on making sure he has appropriate discharge planning.  I certify that inpatient services furnished can reasonably be expected to improve the patient's condition.   Alethia Berthold, MD 08/04/2019, 11:26 AM

## 2019-08-04 NOTE — Progress Notes (Signed)
Patient alert and oriented x 4 no distress noted, he denies SI/HI/AVH, he was noted interacting appropriately with peers and staff, he appears less anxious, receptive to staff, tolerated nighttime medication regimen. Patient was offered emotional support, 15 minutes safety checks maintained will continue to monitor.

## 2019-08-04 NOTE — [facility] Suicide Risk Assessment (Signed)
[facility] INPATIENT:  Family/Significant Other Suicide Prevention Education  Suicide Prevention Education:  Patient Refusal for Family/Significant Other Suicide Prevention Education: The patient Dean Howard has refused to provide written consent for family/significant other to be provided Family/Significant Other Suicide Prevention Education during admission and/or prior to discharge.  Physician notified.  SPE completed with pt, as pt refused to consent to family contact. SPI pamphlet provided to pt and pt was encouraged to share information with support network, ask questions, and talk about any concerns relating to SPE. Pt denies access to guns/firearms and verbalized understanding of information provided. Mobile Crisis information also provided to pt.   Harden Mo 08/04/2019, 10:13 AM

## 2019-08-04 NOTE — Progress Notes (Signed)
Recreation Therapy Notes  Date: 08/04/2019  Time: 9:30 am  Location: Room 21   Behavioral response: Appropriate   Intervention Topic: Animal Assisted Therapy   Discussion/Intervention:  Animal Assisted Therapy took place today during group.  Animal Assisted Therapy is the planned inclusion of an animal in a patient's treatment plan. The patients were able to engage in therapy with an animal during group. Participants were educated on what a service dog is and the different between a support dog and a service dog. Patient were informed on the many animal needs there are and how their needs are similar. Individuals were enlightened on the process to get a service animal or support animal. Patients got the opportunity to pet the animal and were offered emotional support from the animal and staff.  Clinical Observations/Feedback:  Patient came to group and was focused on the group intervention. He explained that he loves animals and has three dogs at home. Individual was pulled from group by Child psychotherapist.  Esias Mory LRT/CTRS         Evangelyne Loja 08/04/2019 11:59 AM

## 2019-08-04 NOTE — Tx Team (Addendum)
Interdisciplinary Treatment and Diagnostic Plan Update  08/04/2019 Time of Session: 900am Dean Howard MRN: [id number]  Principal Diagnosis: <principal problem not specified>  Secondary Diagnoses: Active Problems:   MDD (major depressive disorder), recurrent severe, without psychosis (Dean Howard)   Current Medications:  Current Facility-Administered Medications  Medication Dose Route Frequency Provider Last Rate Last Admin  . acetaminophen (TYLENOL) tablet 650 mg  650 mg Oral Q6H PRN Money, Lowry Ram, FNP      . alum & mag hydroxide-simeth (MAALOX/MYLANTA) 200-200-20 MG/5ML suspension 30 mL  30 mL Oral Q4H PRN Money, Darnelle Maffucci B, FNP      . magnesium hydroxide (MILK OF MAGNESIA) suspension 30 mL  30 mL Oral Daily PRN Money, Lowry Ram, FNP      . traZODone (DESYREL) tablet 50 mg  50 mg Oral QHS PRN Money, Lowry Ram, FNP   50 mg at 08/03/19 2209   PTA Medications: Medications Prior to Admission  Medication Sig Dispense Refill Last Dose  . pantoprazole (PROTONIX) 40 MG tablet Take 1 tablet (40 mg total) by mouth daily. 30 tablet 0     Patient Stressors: Health problems Marital or family conflict Medication change or noncompliance Substance abuse  Patient Strengths: Ability for insight Active sense of humor Capable of independent living Supportive family/friends  Treatment Modalities: Medication Management, Group therapy, Case management,  1 to 1 session with clinician, Psychoeducation, Recreational therapy.   Physician Treatment Plan for Primary Diagnosis: <principal problem not specified> Long Term Goal(s):     Short Term Goals:    Medication Management: Evaluate patient's response, side effects, and tolerance of medication regimen.  Therapeutic Interventions: 1 to 1 sessions, Unit Group sessions and Medication administration.  Evaluation of Outcomes: Not Met  Physician Treatment Plan for Secondary Diagnosis: Active Problems:   MDD (major depressive disorder), recurrent  severe, without psychosis (Dean Howard)  Long Term Goal(s):     Short Term Goals:       Medication Management: Evaluate patient's response, side effects, and tolerance of medication regimen.  Therapeutic Interventions: 1 to 1 sessions, Unit Group sessions and Medication administration.  Evaluation of Outcomes: Not Met   RN Treatment Plan for Primary Diagnosis: <principal problem not specified> Long Term Goal(s): Knowledge of disease and therapeutic regimen to maintain health will improve  Short Term Goals: Ability to remain free from injury will improve, Ability to demonstrate self-control, Ability to verbalize feelings will improve and Compliance with prescribed medications will improve  Medication Management: RN will administer medications as ordered by provider, will assess and evaluate patient's response and provide education to patient for prescribed medication. RN will report any adverse and/or side effects to prescribing provider.  Therapeutic Interventions: 1 on 1 counseling sessions, Psychoeducation, Medication administration, Evaluate responses to treatment, Monitor vital signs and CBGs as ordered, Perform/monitor CIWA, COWS, AIMS and Fall Risk screenings as ordered, Perform wound care treatments as ordered.  Evaluation of Outcomes: Not Met   LCSW Treatment Plan for Primary Diagnosis: <principal problem not specified> Long Term Goal(s): Safe transition to appropriate next level of care at discharge, Engage patient in therapeutic group addressing interpersonal concerns.  Short Term Goals: Engage patient in aftercare planning with referrals and resources, Increase social support, Increase emotional regulation, Facilitate acceptance of mental health diagnosis and concerns and Increase skills for wellness and recovery  Therapeutic Interventions: Assess for all discharge needs, 1 to 1 time with Social worker, Explore available resources and support systems, Assess for adequacy in community  support network, Educate family and significant  other(s) on suicide prevention, Complete Psychosocial Assessment, Interpersonal group therapy.  Evaluation of Outcomes: Not Met   Progress in Treatment: Attending groups: Yes. Participating in groups: Yes. Taking medication as prescribed: Yes. Toleration medication: Yes. Family/Significant other contact made: No, will contact:  when consent given Patient understands diagnosis: Yes. Discussing patient identified problems/goals with staff: Yes. Medical problems stabilized or resolved: Yes. Denies suicidal/homicidal ideation: Yes. Issues/concerns per patient self-inventory: No. Other: N/A  New problem(s) identified: No, Describe:  none  New Short Term/Long Term Goal(s): Detox, elimination of AVH/symptoms of psychosis, medication management for mood stabilization; elimination of SI thoughts; development of comprehensive mental wellness/sobriety plan.   Patient Goals:  "I was disgruntled because people were wearing my clothes and I haven't been on my medication"  Discharge Plan or Barriers:   Reason for Continuation of Hospitalization: Anxiety Delusions  Hallucinations Medication stabilization  Estimated Length of Stay: 5-7 days  Recreational Therapy: Patient: N/A Patient Goal: Patient will engage in groups without prompting or encouragement from LRT x3 group sessions within 5 recreation therapy group sessions.  Attendees: Patient: Dean Howard 08/04/2019 9:18 AM  Physician: Dr Weber Cooks MD 08/04/2019 9:18 AM  Nursing: Collier Bullock RN 08/04/2019 9:18 AM  RN Care Manager: 08/04/2019 9:18 AM  Social Worker: Evalina Field LCSW 08/04/2019 9:18 AM  Recreational Therapist: Roanna Epley CTRS LRT 08/04/2019 9:18 AM  Other: Sanjuana Kava LCSW  08/04/2019 9:18 AM  Other: Assunta Curtis LCSW 08/04/2019 9:18 AM  Other: 08/04/2019 9:18 AM    Scribe for Treatment Team: Mariann Laster Moton, LCSW 08/04/2019 9:18 AM

## 2019-08-04 NOTE — Plan of Care (Signed)
Pt denies depression, anxiety, SI, HI and AVH. Pt was educated on care plan and verbalizes understanding. Pt has attended group this am and enjoyed it. Torrie Mayers RN Problem: Education: Goal: Knowledge of [place] [facility] Education information/materials will improve Outcome: Progressing   Problem: Coping: Goal: Ability to verbalize frustrations and anger appropriately will improve Outcome: Progressing Goal: Ability to demonstrate self-control will improve Outcome: Progressing   Problem: Safety: Goal: Periods of time without injury will increase Outcome: Progressing   Problem: Self-Concept: Goal: Ability to disclose and discuss suicidal ideas will improve Outcome: Progressing Goal: Will verbalize positive feelings about self Outcome: Progressing   Problem: Education: Goal: Knowledge of the prescribed therapeutic regimen will improve Outcome: Progressing

## 2019-08-04 NOTE — [facility] Group Notes (Signed)
LCSW Group Therapy Note  08/04/2019 1:00 PM  Type of Therapy/Topic:  Group Therapy:  Balance in Life  Participation Level:  Active  Description of Group:    This group will address the concept of balance and how it feels and looks when one is unbalanced. Patients will be encouraged to process areas in their lives that are out of balance and identify reasons for remaining unbalanced. Facilitators will guide patients in utilizing problem-solving interventions to address and correct the stressor making their life unbalanced. Understanding and applying boundaries will be explored and addressed for obtaining and maintaining a balanced life. Patients will be encouraged to explore ways to assertively make their unbalanced needs known to significant others in their lives, using other group members and facilitator for support and feedback.  Therapeutic Goals: 1. Patient will identify two or more emotions or situations they have that consume much of in their lives. 2. Patient will identify signs/triggers that life has become out of balance:  3. Patient will identify two ways to set boundaries in order to achieve balance in their lives:  4. Patient will demonstrate ability to communicate their needs through discussion and/or role plays  Summary of Patient Progress: Patient was present in group.  Patient was an active participant and supportive of other group members.  Patient shared that he values honesty, decency and a structured mind.  Patient was able to discuss his goals for the future and how he can achieve those.    Therapeutic Modalities:   Cognitive Behavioral Therapy Solution-Focused Therapy Assertiveness Training  Penni Homans MSW, LCSW 08/04/2019 11:41 AM

## 2019-08-04 NOTE — Progress Notes (Signed)
Initial Nutrition Assessment  DOCUMENTATION CODES:   Not applicable  INTERVENTION:   Ensure Enlive po TID, each supplement provides 350 kcal and 20 grams of protein  MVI daily   NUTRITION DIAGNOSIS:   Predicted suboptimal nutrient intake related to social / environmental circumstances(etoh abuse) as evidenced by other (comment)(pt with BMI of 19).  GOAL:   Patient will meet greater than or equal to 90% of their needs  MONITOR:   PO intake, Supplement acceptance  REASON FOR ASSESSMENT:   Malnutrition Screening Tool    ASSESSMENT:   54 y/o male with h/o MI, CAD, MDD, bipolar 1 disorder, etoh abuse admitted s/p suicide attempt   Suspect pt with poor appetite and oral intake at baseline r/t etoh abuse as pt is underweight. Pt currently with good appetite and oral intake in hospital; pt eating 100% of meals. RD will add supplements and vitamins to help pt meet his estimated needs. Per chart, pt is down ~18lbs(10%) over the past 2 months; this is significant.   Pt is at high risk for malnutrition   Medications reviewed and labs reviewed:   Diet Order:   Diet Order            Diet regular Room service appropriate? Yes; Fluid consistency: Thin  Diet effective now             EDUCATION NEEDS:   No education needs have been identified at this time  Skin:  Skin Assessment: Reviewed RN Assessment  Last BM:  2/8  Height:   Ht Readings from Last 1 Encounters:  07/26/19 6\' 3"  (1.905 m)    Weight:   Wt Readings from Last 1 Encounters:  07/26/19 69.7 kg    Ideal Body Weight:  89 kg  BMI:  There is no height or weight on file to calculate BMI.  Estimated Nutritional Needs:   Kcal:  2200-2500kcal/day  Protein:  110-125g/day  Fluid:  >2.1L/day  09/23/19 MS, RD, LDN Contact information available in Amion
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
## 2019-08-04 NOTE — H&P (Signed)
Psychiatric Admission Assessment Adult  Patient Identification: Dean Howard MRN:  [id number] Date of Evaluation:  08/04/2019 Chief Complaint:  MDD (major depressive disorder), recurrent severe, without psychosis (HCC) [F33.2] Principal Diagnosis: Schizo-affective schizophrenia, chronic condition with acute exacerbation (HCC) Diagnosis:  Principal Problem:   Schizo-affective schizophrenia, chronic condition with acute exacerbation (HCC) Active Problems:   Thrombocytopenia (HCC)   LFT elevation   Drug-induced injury of liver   Alcohol abuse  History of Present Illness: Patient seen and chart reviewed.  This is a 54 year old man transferred to [place] from the medical service where he had been stabilized after an intentional overdose of acetaminophen.  Patient tells me that "it was a bad day for me".  He says that several friends of his had died recently and that the family told him they did not want him to attend the funeral service because of his behavior.  This story is a little confusing since he claims that 7 friends died of various different conditions but then seems to lump all of the funerals together.  In any case that was 1 thing that was upsetting him.  Another was that his ex-girlfriend had been rude or insulting to him.  Patient admits that he had been off of his psychiatric medicines for several months and he attributes this to having lost his Medicaid due to some mixups that occurred in the system.  He admits also that he had been drinking but says he drank about 6 beers on the day this happened which is only a little bit more than usual for him.  He says that he was "self-medicating" when he took a large number of ibuprofen and acetaminophen although it is not clear what he thinks he was medicating for.  Ultimately he does admit that he had intentionally overdosed and that he called 911 himself.  Although his acetaminophen level was not elevated here at the hospital his liver enzymes  worsened in a matter that would be consistent with acute liver injury which suggest that he had probably waited too long to come to the hospital.  He was treated with acetylcysteine and physically seems to be improving.  On interview today the patient remains emotionally labile.  His interview is comprehensible but he goes off on tangents frequently and gets very emotional at times.  Denies however any current suicidal or homicidal thought.  States that he plans to stop drinking alcohol entirely because of the new damage to his liver.  Patient indicates that up until this past summer he was still going to [facility] and had been taking Vraylar and lithium just as he was when he was last discharged from the hospital.  Patient lives with his mother and sister but talks about how he has a fianc and that the 2 of them are going to move in together now that he has received section 8 housing. Associated Signs/Symptoms: Depression Symptoms:  anhedonia, insomnia, psychomotor agitation, difficulty concentrating, hopelessness, suicidal attempt, anxiety, (Hypo) Manic Symptoms:  Distractibility, Flight of Ideas, Impulsivity, Anxiety Symptoms:  Excessive Worry, Psychotic Symptoms:  Paranoia, PTSD Symptoms: Negative Total Time spent with patient: 1 hour  Past Psychiatric History: Patient has a past history of mental illness with a diagnosis either of schizoaffective or bipolar disorder.  He has had several prior admissions but the most recent one was just about a [facility] [place].  At that time he was stabilized on a combination of Vraylar and lithium.  He also has a  history of alcohol abuse but he tends to minimize it.  He says that in the past when he used to drink a lot of liquor he had a tendency to become physically violent but that that no longer happens.  He claims that he is never had any previous suicide attempts.  Is the patient at risk to self? Yes.    Has the  patient been a risk to self in the past 6 months? Yes.    Has the patient been a risk to self within the distant past? No.  Is the patient a risk to others? No.  Has the patient been a risk to others in the past 6 months? No.  Has the patient been a risk to others within the distant past? Yes.     Prior Inpatient Therapy:   Prior Outpatient Therapy:    Alcohol Screening: Patient refused Alcohol Screening Tool: Yes 1. How often do you have a drink containing alcohol?: 4 or more times a week 2. How many drinks containing alcohol do you have on a typical day when you are drinking?: 10 or more 3. How often do you have six or more drinks on one occasion?: Daily or almost daily AUDIT-C Score: 12 4. How often during the last year have you found that you were not able to stop drinking once you had started?: Weekly 5. How often during the last year have you failed to do what was normally expected from you becasue of drinking?: Weekly 6. How often during the last year have you needed a first drink in the morning to get yourself going after a heavy drinking session?: Weekly 7. How often during the last year have you had a feeling of guilt of remorse after drinking?: Weekly 8. How often during the last year have you been unable to remember what happened the night before because you had been drinking?: Weekly 9. Have you or someone else been injured as a result of your drinking?: Yes, during the last year 10. Has a relative or friend or a doctor or another health worker been concerned about your drinking or suggested you cut down?: Yes, during the last year Alcohol Use Disorder Identification Test Final Score (AUDIT): 35 Alcohol Brief Interventions/Follow-up: AUDIT Score <7 follow-up not indicated Substance Abuse History in the last 12 months:  Yes.   Consequences of Substance Abuse: Medical Consequences:  Probably contributes to some degree due to the liver injury and thrombocytopenia.  Denies any  history of seizures or delirium tremens.  Also probably continues to worsen his mood control Previous Psychotropic Medications: Yes  Psychological Evaluations: Yes  Past Medical History:  Past Medical History:  Diagnosis Date  . Coronary artery disease   . MI, old   . Tick fever     Past Surgical History:  Procedure Laterality Date  . HERNIA REPAIR    . KNEE SURGERY     Family History: History reviewed. No pertinent family history. Family Psychiatric  History: Denies any Tobacco Screening: Have you used any form of tobacco in the last 30 days? (Cigarettes, Smokeless Tobacco, Cigars, and/or Pipes): Yes Tobacco use, Select all that apply: 4 or less cigarettes per day Are you interested in Tobacco Cessation Medications?: No, patient refused Counseled patient on smoking cessation including recognizing danger situations, developing coping skills and basic information about quitting provided: Refused/Declined practical counseling Social History:  Social History   Substance and Sexual Activity  Alcohol Use Yes   Comment: Daily  Social History   Substance and Sexual Activity  Drug Use Yes  . Types: Marijuana   Comment: Daily     Additional Social History: Marital status: Other (comment)(Engaged with girfriend of 2 years, reports no issues.) Are you sexually active?: Yes What is your sexual orientation?: Heterosexual Does patient have children?: Yes How many children?: 2 How is patient's relationship with their children?: Pt reports "great" relationship with children.                         Allergies:  No Known Allergies Lab Results:  Results for orders placed or performed during the hospital encounter of 07/26/19 (from the past 48 hour(s))  SARS CORONAVIRUS 2 (TAT 6-24 HRS) Nasopharyngeal Nasopharyngeal Swab     Status: None   Collection Time: 08/03/19 10:51 AM   Specimen: Nasopharyngeal Swab  Result Value Ref Range   SARS Coronavirus 2 NEGATIVE NEGATIVE     Comment: (NOTE) SARS-CoV-2 target nucleic acids are NOT DETECTED. The SARS-CoV-2 RNA is generally detectable in upper and lower respiratory specimens during the acute phase of infection. Negative results do not preclude SARS-CoV-2 infection, do not rule out co-infections with other pathogens, and should not be used as the sole basis for treatment or other patient management decisions. Negative results must be combined with clinical observations, patient history, and epidemiological information. The expected result is Negative. Fact Sheet for Patients: SugarRoll.be Fact Sheet for Healthcare Providers: https://www.woods-mathews.com/ This test is not yet approved or cleared by the [place] FDA and  has been authorized for detection and/or diagnosis of SARS-CoV-2 by FDA under an Emergency Use Authorization (EUA). This EUA will remain  in effect (meaning this test can be used) for the duration of the COVID-19 declaration under Section 56 4(b)(1) of the Act, 21 U.S.C. section 360bbb-3(b)(1), unless the authorization is terminated or revoked sooner. Performed at [facility], [place] [address], [place], [place] [address]     Blood Alcohol level:  Lab Results  Component Value Date   [facility] <10 07/26/2019   ETH <10 64/33/2951    Metabolic Disorder Labs:  Lab Results  Component Value Date   HGBA1C 5.4 07/22/2016   MPG 108 07/22/2016   Lab Results  Component Value Date   PROLACTIN 27.1 (H) 07/22/2016   Lab Results  Component Value Date   CHOL 146 07/22/2016   TRIG 95 07/22/2016   HDL 46 07/22/2016   CHOLHDL 3.2 07/22/2016   VLDL 19 07/22/2016   LDLCALC 81 07/22/2016    Current Medications: Current Facility-Administered Medications  Medication Dose Route Frequency Provider Last Rate Last Admin  . acetaminophen (TYLENOL) tablet 650 mg  650 mg Oral Q6H PRN Money, Lowry Ram, FNP      . alum & mag hydroxide-simeth (MAALOX/MYLANTA)  200-200-20 MG/5ML suspension 30 mL  30 mL Oral Q4H PRN Money, Lowry Ram, FNP      . cariprazine (VRAYLAR) capsule 4.5 mg  4.5 mg Oral Daily Jakobe Blau T, MD      . feeding supplement (ENSURE ENLIVE) (ENSURE ENLIVE) liquid 237 mL  237 mL Oral TID BM Renetta Suman T, MD      . lithium carbonate (ESKALITH) CR tablet 450 mg  450 mg Oral QHS Tahari Clabaugh T, MD      . magnesium hydroxide (MILK OF MAGNESIA) suspension 30 mL  30 mL Oral Daily PRN Money, Lowry Ram, FNP      . [START ON 08/05/2019] multivitamin with minerals  tablet 1 tablet  1 tablet Oral Daily Paras Kreider T, MD      . temazepam (RESTORIL) capsule 15 mg  15 mg Oral QHS Treysen Sudbeck T, MD      . traZODone (DESYREL) tablet 50 mg  50 mg Oral QHS PRN Money, Gerlene Burdock, FNP   50 mg at 08/03/19 2209   PTA Medications: Medications Prior to Admission  Medication Sig Dispense Refill Last Dose  . pantoprazole (PROTONIX) 40 MG tablet Take 1 tablet (40 mg total) by mouth daily. 30 tablet 0     Musculoskeletal: Strength & Muscle Tone: within normal limits Gait & Station: normal Patient leans: N/A  Psychiatric Specialty Exam: Physical Exam  Nursing note and vitals reviewed. Constitutional: He appears well-developed and well-nourished.  HENT:  Head: Normocephalic and atraumatic.  Eyes: Pupils are equal, round, and reactive to light. Conjunctivae are normal.  Cardiovascular: Regular rhythm and normal heart sounds.  Respiratory: Effort normal. No respiratory distress.  GI: Soft.  Musculoskeletal:        General: Normal range of motion.     Cervical back: Normal range of motion.  Neurological: He is alert.  Skin: Skin is warm and dry.  Psychiatric: His affect is labile. His speech is rapid and/or pressured and tangential. He is agitated. He is not aggressive. Thought content is paranoid. Cognition and memory are impaired. He expresses impulsivity. He expresses no homicidal and no suicidal ideation.    Review of Systems  Constitutional:  Negative.   HENT: Negative.   Eyes: Negative.   Respiratory: Negative.   Cardiovascular: Negative.   Gastrointestinal: Negative.   Musculoskeletal: Negative.   Skin: Negative.   Neurological: Negative.   Psychiatric/Behavioral: Positive for agitation, dysphoric mood and self-injury. The patient is nervous/anxious and is hyperactive.     Blood pressure 109/69, pulse 76, temperature 98 F (36.7 C), resp. rate 16, SpO2 100 %.There is no height or weight on file to calculate BMI.  General Appearance: Casual  Eye Contact:  Fair  Speech:  Pressured  Volume:  Increased  Mood:  Dysphoric and Irritable  Affect:  Congruent and Labile  Thought Process:  Disorganized  Orientation:  Full (Time, Place, and Person)  Thought Content:  Logical, Paranoid Ideation, Rumination and Tangential  Suicidal Thoughts:  No  Homicidal Thoughts:  No  Memory:  Immediate;   Fair Recent;   Poor Remote;   Fair  Judgement:  Fair  Insight:  Fair  Psychomotor Activity:  Increased  Concentration:  Concentration: Fair  Recall:  [employer] of Knowledge:  Fair  Language:  Fair  Akathisia:  No  Handed:  Right  AIMS (if indicated):     Assets:  Desire for Improvement Housing Resilience  ADL's:  Intact  Cognition:  Impaired,  Mild  Sleep:  Number of Hours: 7.75    Treatment Plan Summary: Daily contact with patient to assess and evaluate symptoms and progress in treatment, Medication management and Plan Reviewed his previous medicine.  He feels confident that his Medicaid has been reinstated so we will restart the Vraylar at 4.5 mg.  Although his creatinine is up a little bit yesterday overall it does not look like he has serious chronic renal problems so we will restart the lithium at a modest dose of 450 mg.  Restoril provided for sleep at night.  Engage in groups and continue 15-minute checks.  We will reach out to his family prior to discharge and make sure we have arrangements with [facility] for  outpatient  treatment I am rechecking his chemistry panels all tomorrow.  Observation Level/Precautions:  15 minute checks  Laboratory:  Chemistry Profile  Psychotherapy:    Medications:    Consultations:    Discharge Concerns:    Estimated LOS:  Other:     Physician Treatment Plan for Primary Diagnosis: Schizo-affective schizophrenia, chronic condition with acute exacerbation (HCC) Long Term Goal(s): Improvement in symptoms so as ready for discharge  Short Term Goals: Ability to verbalize feelings will improve, Ability to disclose and discuss suicidal ideas and Ability to demonstrate self-control will improve  Physician Treatment Plan for Secondary Diagnosis: Principal Problem:   Schizo-affective schizophrenia, chronic condition with acute exacerbation (HCC) Active Problems:   Thrombocytopenia (HCC)   LFT elevation   Drug-induced injury of liver   Alcohol abuse  Long Term Goal(s): Improvement in symptoms so as ready for discharge  Short Term Goals: Ability to maintain clinical measurements within normal limits will improve and Compliance with prescribed medications will improve  I certify that inpatient services furnished can reasonably be expected to improve the patient's condition.    Mordecai Rasmussen, MD 2/11/202111:30 AM

## 2019-08-05 LAB — COMPREHENSIVE METABOLIC PANEL
ALT: 448 U/L — ABNORMAL HIGH (ref 0–44)
AST: 50 U/L — ABNORMAL HIGH (ref 15–41)
Albumin: 3.9 g/dL (ref 3.5–5.0)
Alkaline Phosphatase: 98 U/L (ref 38–126)
Anion gap: 8 (ref 5–15)
BUN: 11 mg/dL (ref 6–20)
CO2: 27 mmol/L (ref 22–32)
Calcium: 9.9 mg/dL (ref 8.9–10.3)
Chloride: 105 mmol/L (ref 98–111)
Creatinine, Ser: 1.05 mg/dL (ref 0.61–1.24)
GFR calc Af Amer: >60 mL/min (ref >60)
GFR calc non Af Amer: >60 mL/min (ref >60)
Glucose, Bld: 55 mg/dL — ABNORMAL LOW (ref 70–99)
Potassium: 3.7 mmol/L (ref 3.5–5.1)
Sodium: 140 mmol/L (ref 135–145)
Total Bilirubin: 1 mg/dL (ref 0.3–1.2)
Total Protein: 7.9 g/dL (ref 6.5–8.1)

## 2019-08-05 MED ORDER — TEMAZEPAM 15 MG PO CAPS: 15.0000 mg | ORAL_CAPSULE | Freq: Every day | ORAL | 0 refills | Status: DC

## 2019-08-05 MED ORDER — LITHIUM CARBONATE ER 450 MG PO TBCR: 450.0000 mg | EXTENDED_RELEASE_TABLET | Freq: Every day | ORAL | 1 refills | Status: AC

## 2019-08-05 MED ORDER — CARIPRAZINE HCL 4.5 MG PO CAPS: 4.5000 mg | ORAL_CAPSULE | Freq: Every day | ORAL | 1 refills | Status: AC

## 2019-08-05 NOTE — Progress Notes (Signed)
Recreation Therapy Notes   Date: 08/05/2019  Time: 9:30 am  Location: Craft Room  Behavioral response: Appropriate   Intervention Topic: Communication   Discussion/Intervention:  Group content today was focused on communication. The group defined communication and ways to communicate with others. Individuals stated reason why communication is important and some reasons to communicate with others. Patients expressed if they thought they were good at communicating with others and ways they could improve their communication skills. The group identified important parts of communication and some experiences they have had in the past with communication. The group participated in the intervention "Words in a Bag", where they had a chance to test out their communication skills and identify ways to improve their communication techniques.  Clinical Observations/Feedback:  Patient came to group and defined communication as gathering information from one another. He stated communication can be done by using eye contact, verbal and non verbal communication. Participant explained that communication keep you informed and helps with thoughts and expressing yourself. Individual was social with peers and staff while participating in the intervention.   Alston Berrie LRT/CTRS         Wyndham Santilli 08/05/2019 12:33 PM

## 2019-08-05 NOTE — Progress Notes (Signed)
Patient denies SI/HI, denies A/V hallucinations. Patient verbalizes understanding of discharge instructions, follow up care and prescriptions. Patient given all belongings from [facility] locker. Patient escorted out by staff, transported by transportation services.

## 2019-08-05 NOTE — [facility] Suicide Risk Assessment (Signed)
[facility] Discharge Suicide Risk Assessment   Principal Problem: Schizo-affective schizophrenia, chronic condition with acute exacerbation [facility]) Discharge Diagnoses: Principal Problem:   Schizo-affective schizophrenia, chronic condition with acute exacerbation (HCC) Active Problems:   Thrombocytopenia (HCC)   LFT elevation   Drug-induced injury of liver   Alcohol abuse   Total Time spent with patient: 30 minutes  Musculoskeletal: Strength & Muscle Tone: within normal limits Gait & Station: normal Patient leans: N/A  Psychiatric Specialty Exam: Review of Systems  Constitutional: Negative.   HENT: Negative.   Eyes: Negative.   Respiratory: Negative.   Cardiovascular: Negative.   Gastrointestinal: Negative.   Musculoskeletal: Negative.   Skin: Negative.   Neurological: Negative.   Psychiatric/Behavioral: Negative.     Blood pressure 105/72, pulse 81, temperature 98.3 F (36.8 C), temperature source Oral, resp. rate 17, height 6\' 3"  (1.905 m), SpO2 100 %.Body mass index is 19.21 kg/m.  General Appearance: Casual  Eye Contact::  Fair  Speech:  Clear and Coherent409  Volume:  Normal  Mood:  Euthymic  Affect:  Congruent  Thought Process:  Coherent  Orientation:  Full (Time, Place, and Person)  Thought Content:  Logical  Suicidal Thoughts:  No  Homicidal Thoughts:  No  Memory:  Immediate;   Fair Recent;   Fair Remote;   Fair  Judgement:  Fair  Insight:  Fair  Psychomotor Activity:  Normal  Concentration:  Fair  Recall:  [phone] of Knowledge:Fair  Language: Fair  Akathisia:  No  Handed:  Right  AIMS (if indicated):     Assets:  Desire for Improvement Housing Physical Health Resilience  Sleep:  Number of Hours: 7.5  Cognition: WNL  ADL's:  Intact   Mental Status Per Nursing Assessment::   On Admission:  NA  Demographic Factors:  Male and Unemployed  Loss Factors: Financial problems/change in socioeconomic status  Historical Factors: Impulsivity  Risk  Reduction Factors:   Sense of responsibility to family, Religious beliefs about death, Living with another person, especially a relative, Positive social support and Positive therapeutic relationship  Continued Clinical Symptoms:  Bipolar Disorder:   Mixed State  Cognitive Features That Contribute To Risk:  None    Suicide Risk:  Minimal: No identifiable suicidal ideation.  Patients presenting with no risk factors but with morbid ruminations; may be classified as minimal risk based on the severity of the depressive symptoms  Follow-up Information    [facility] Follow up on 08/11/2019.   Why: You have a teleappointment on 08/11/19 @ 8:30 am. They will call you. Please have your photo ID & social security number available during your appoitnment. Thank You! Contact information: [address] [place] [phone]           Plan Of Care/Follow-up recommendations:  Activity:  Activity as tolerated Diet:  Regular diet Other:  Please follow-up with [facility] continue current medications.  [phone], MD 08/05/2019, 3:00 PM

## 2019-08-05 NOTE — Progress Notes (Signed)
Patient alert and oriented x 4 no distress noted, he denies SI/HI/AVH, he was interacting appropriately with peers and staff, he appears less anxious, receptive to staff, rated depression a 5/10  ( 0 low- 10 high ) he tolerated nighttime medication regimen, he was offered emotional support and encouraged to attend evening wrap up group. Patient attended evening wrap up group, appropriate with peers, 15 minutes safety checks maintained will continue t monitor.

## 2019-08-05 NOTE — Discharge Summary (Signed)
Physician Discharge Summary Note  Patient:  Dean Howard is an 54 y.o., male MRN:  [id number] DOB:  Nov 03, 1965 Patient phone:  [phone] (home)  Patient address:   [address] [place] [address],  Total Time spent with patient: 30 minutes  Date of Admission:  08/03/2019 Date of Discharge: 08/05/19  Reason for Admission:  54 year old man transferred to [place] from the medical service where he had been stabilized after an intentional overdose of acetaminophen.  Patient tells me that "it was a bad day for me".  He says that several friends of his had died recently and that the family told him they did not want him to attend the funeral service because of his behavior.    Principal Problem: Schizo-affective schizophrenia, chronic condition with acute exacerbation [facility]) Discharge Diagnoses: Principal Problem:   Schizo-affective schizophrenia, chronic condition with acute exacerbation (HCC) Active Problems:   Thrombocytopenia (HCC)   LFT elevation   Drug-induced injury of liver   Alcohol abuse   Past Psychiatric History: Patient has a past history of mental illness with a diagnosis either of schizoaffective or bipolar disorder.  He has had several prior admissions but the most recent one was just about a [facility] ago [facility] in [place].  At that time he was stabilized on a combination of Vraylar and lithium.  He also has a history of alcohol abuse but he tends to minimize it.  He says that in the past when he used to drink a lot of liquor he had a tendency to become physically violent but that that no longer happens.  He claims that he is never had any previous suicide attempts.  Past Medical History:  Past Medical History:  Diagnosis Date  . Coronary artery disease   . MI, old   . Tick fever     Past Surgical History:  Procedure Laterality Date  . HERNIA REPAIR    . KNEE SURGERY     Family History: History reviewed. No pertinent family  history. Family Psychiatric  History: None reported Social History:  Social History   Substance and Sexual Activity  Alcohol Use Yes   Comment: Daily      Social History   Substance and Sexual Activity  Drug Use Yes  . Types: Marijuana   Comment: Daily     Social History   Socioeconomic History  . Marital status: Single    Spouse name: Not on file  . Number of children: Not on file  . Years of education: Not on file  . Highest education level: Not on file  Occupational History  . Not on file  Tobacco Use  . Smoking status: Current Every Day Smoker    Types: Cigars    Last attempt to quit: 03/04/2016    Years since quitting: 3.4  . Smokeless tobacco: Never Used  Substance and Sexual Activity  . Alcohol use: Yes    Comment: Daily   . Drug use: Yes    Types: Marijuana    Comment: Daily   . Sexual activity: Yes  Other Topics Concern  . Not on file  Social History Narrative  . Not on file   Social Determinants of Health   Financial Resource Strain:   . Difficulty of Paying Living Expenses: Not on file  Food Insecurity:   . Worried About Programme researcher, broadcasting/film/video in the Last Year: Not on file  . Ran Out of Food in the Last Year: Not on file  Transportation  Needs:   . Lack of Transportation (Medical): Not on file  . Lack of Transportation (Non-Medical): Not on file  Physical Activity:   . Days of Exercise per Week: Not on file  . Minutes of Exercise per Session: Not on file  Stress:   . Feeling of Stress : Not on file  Social Connections:   . Frequency of Communication with Friends and Family: Not on file  . Frequency of Social Gatherings with Friends and Family: Not on file  . Attends Religious Services: Not on file  . Active Member of Clubs or Organizations: Not on file  . Attends Banker Meetings: Not on file  . Marital Status: Not on file    Hospital Course:  Patient remained on the [facility] unit for 1 days. The patient stabilized on medication and  therapy. Patient was discharged on Vraylar 4.5 mg daily, lithium carbonate CR 450 mg nightly, and Restoril 15 mg nightly. Patient has shown improvement with improved mood, affect, sleep, appetite, and interaction. Patient has attended group and participated. Patient has been seen in the day room interacting with peers and staff appropriately. Patient denies any SI/HI/AVH and contracts for safety. Patient agrees to follow up at [facility]. Patient is provided with prescriptions for their medications upon discharge.  Patient presents much more euthymic and denies all symptoms today.  Patient reporting an greatly improved sleep and much improved mood.  Patient continues to state that it was just a bad day and he has no other thoughts of wanting to harm himself or anyone else.  Patient's liver function is also showing continued improvement with his AST at 50 and ALT at 448.  Patient's creatinine has also returned to normal at 1.05.  Physical Findings: AIMS:  , ,  ,  ,    CIWA:    COWS:     Musculoskeletal: Strength & Muscle Tone: within normal limits Gait & Station: normal Patient leans: N/A  Psychiatric Specialty Exam: Physical Exam  Nursing note and vitals reviewed. Constitutional: He is oriented to person, place, and time. He appears well-developed and well-nourished.  Cardiovascular: Normal rate.  Respiratory: Effort normal.  Musculoskeletal:        General: Normal range of motion.  Neurological: He is alert and oriented to person, place, and time.  Skin: Skin is warm.    Review of Systems  Constitutional: Negative.   HENT: Negative.   Eyes: Negative.   Respiratory: Negative.   Cardiovascular: Negative.   Gastrointestinal: Negative.   Genitourinary: Negative.   Musculoskeletal: Negative.   Skin: Negative.   Neurological: Negative.   Psychiatric/Behavioral: Negative.     Blood pressure 105/72, pulse 81, temperature 98.3 F (36.8 C), temperature source Oral, resp. rate 17, height 6'  3" (1.905 m), SpO2 100 %.Body mass index is 19.21 kg/m.   General Appearance: Casual  Eye Contact::  Fair  Speech:  Clear and Coherent409  Volume:  Normal  Mood:  Euthymic  Affect:  Congruent  Thought Process:  Coherent  Orientation:  Full (Time, Place, and Person)  Thought Content:  Logical  Suicidal Thoughts:  No  Homicidal Thoughts:  No  Memory:  Immediate;   Fair Recent;   Fair Remote;   Fair  Judgement:  Fair  Insight:  Fair  Psychomotor Activity:  Normal  Concentration:  Fair  Recall:  [employer] of Knowledge:Fair  Language: Fair  Akathisia:  No  Handed:  Right  AIMS (if indicated):     Assets:  Desire  for Improvement Housing Physical Health Resilience  Sleep:  Number of Hours: 7.5  Cognition: WNL  ADL's:  Intact    Have you used any form of tobacco in the last 30 days? (Cigarettes, Smokeless Tobacco, Cigars, and/or Pipes): Yes  Has this patient used any form of tobacco in the last 30 days? (Cigarettes, Smokeless Tobacco, Cigars, and/or Pipes) Yes, Yes, A prescription for an FDA-approved tobacco cessation medication was offered at discharge and the patient refused  Blood Alcohol level:  Lab Results  Component Value Date   [facility] <10 07/26/2019   ETH <10 13/24/4010    Metabolic Disorder Labs:  Lab Results  Component Value Date   HGBA1C 5.4 07/22/2016   MPG 108 07/22/2016   Lab Results  Component Value Date   PROLACTIN 27.1 (H) 07/22/2016   Lab Results  Component Value Date   CHOL 146 07/22/2016   TRIG 95 07/22/2016   HDL 46 07/22/2016   CHOLHDL 3.2 07/22/2016   VLDL 19 07/22/2016   [place] 81 07/22/2016    See Psychiatric Specialty Exam and Suicide Risk Assessment completed by Attending Physician prior to discharge.  Discharge destination:  Home  Is patient on multiple antipsychotic therapies at discharge:  No   Has Patient had three or more failed trials of antipsychotic monotherapy by history:  No  Recommended Plan for Multiple  Antipsychotic Therapies: NA  Discharge Instructions    Diet - low sodium heart healthy   Complete by: As directed    Increase activity slowly   Complete by: As directed      Allergies as of 08/05/2019   No Known Allergies     Medication List    STOP taking these medications   pantoprazole 40 MG tablet Commonly known as: PROTONIX     TAKE these medications     Indication  Cariprazine HCl 4.5 MG Caps Take 1 capsule (4.5 mg total) by mouth daily. Start taking on: August 06, 2019  Indication: Schizoaffective disorder   lithium carbonate 450 MG CR tablet Commonly known as: ESKALITH Take 1 tablet (450 mg total) by mouth at bedtime.  Indication: Schizoaffective Disorder   temazepam 15 MG capsule Commonly known as: RESTORIL Take 1 capsule (15 mg total) by mouth at bedtime.  Indication: Trouble Sleeping      Follow-up Information    [facility] Follow up on 08/11/2019.   Why: You have a teleappointment on 08/11/19 @ 8:30 am. They will call you. Please have your photo ID & social security number available during your appoitnment. Thank You! Contact information: [address] [place] [address] [phone]           Follow-up recommendations:  Continue activity as tolerated. Continue diet as recommended by your PCP. Ensure to keep all appointments with outpatient providers.  Comments:  Patient is instructed prior to discharge to: Take all medications as prescribed by his/her mental healthcare provider. Report any adverse effects and or reactions from the medicines to his/her outpatient provider promptly. Patient has been instructed & cautioned: To not engage in alcohol and or illegal drug use while on prescription medicines. In the event of worsening symptoms, patient is instructed to call the crisis hotline, 911 and or go to the nearest ED for appropriate evaluation and treatment of symptoms. To follow-up with his/her primary care provider for your other medical  issues, concerns and or health care needs.    Signed: Lowry Ram Elad Macphail, FNP 08/05/2019, 3:07 PM

## 2019-08-05 NOTE — [facility] Group Notes (Signed)
Emotional Regulation 08/05/2019 1PM  Type of Therapy/Topic:  Group Therapy:  Emotion Regulation  Participation Level:  Active   Description of Group:   The purpose of this group is to assist patients in learning to regulate negative emotions and experience positive emotions. Patients will be guided to discuss ways in which they have been vulnerable to their negative emotions. These vulnerabilities will be juxtaposed with experiences of positive emotions or situations, and patients will be challenged to use positive emotions to combat negative ones. Special emphasis will be placed on coping with negative emotions in conflict situations, and patients will process healthy conflict resolution skills.  Therapeutic Goals: 1. Patient will identify two positive emotions or experiences to reflect on in order to balance out negative emotions 2. Patient will label two or more emotions that they find the most difficult to experience 3. Patient will demonstrate positive conflict resolution skills through discussion and/or role plays  Summary of Patient Progress: Actively and appropriately participated in group session. Pt discussed with group members his upcoming nuptials and the importance of him learning anger management due to him having a "short fuse." Pt interacted appropriately with group members and respected boundaries during session.    Therapeutic Modalities:   Cognitive Behavioral Therapy Feelings Identification Dialectical Behavioral Therapy   Suzan Slick, LCSW 08/05/2019 2:02 PM

## 2019-11-26 ENCOUNTER — Other Ambulatory Visit: Payer: Self-pay

## 2019-11-26 ENCOUNTER — Emergency Department (HOSPITAL_COMMUNITY)
Admission: EM | Admit: 2019-11-26 | Discharge: 2019-11-26 | Disposition: A | Discharge status: Home / Self Care | Payer: Medicare Other | Attending: Emergency Medicine | Admitting: Emergency Medicine

## 2019-11-26 ENCOUNTER — Encounter (HOSPITAL_COMMUNITY): Payer: Self-pay

## 2019-11-26 DIAGNOSIS — F319 Bipolar disorder, unspecified: Secondary | ICD-10-CM | POA: Insufficient documentation

## 2019-11-26 DIAGNOSIS — I2510 Atherosclerotic heart disease of native coronary artery without angina pectoris: Secondary | ICD-10-CM | POA: Insufficient documentation

## 2019-11-26 DIAGNOSIS — R112 Nausea with vomiting, unspecified: Secondary | ICD-10-CM | POA: Insufficient documentation

## 2019-11-26 DIAGNOSIS — I252 Old myocardial infarction: Secondary | ICD-10-CM | POA: Insufficient documentation

## 2019-11-26 DIAGNOSIS — R1084 Generalized abdominal pain: Secondary | ICD-10-CM | POA: Insufficient documentation

## 2019-11-26 DIAGNOSIS — R519 Headache, unspecified: Secondary | ICD-10-CM | POA: Insufficient documentation

## 2019-11-26 DIAGNOSIS — F17290 Nicotine dependence, other tobacco product, uncomplicated: Secondary | ICD-10-CM | POA: Insufficient documentation

## 2019-11-26 DIAGNOSIS — R0602 Shortness of breath: Secondary | ICD-10-CM | POA: Insufficient documentation

## 2019-11-26 LAB — COMPREHENSIVE METABOLIC PANEL
ALT: 75 U/L — ABNORMAL HIGH (ref 0–44)
AST: 101 U/L — ABNORMAL HIGH (ref 15–41)
Albumin: 3.4 g/dL — ABNORMAL LOW (ref 3.5–5.0)
Alkaline Phosphatase: 108 U/L (ref 38–126)
Anion gap: 11 (ref 5–15)
BUN: 20 mg/dL (ref 6–20)
CO2: 26 mmol/L (ref 22–32)
Calcium: 9.5 mg/dL (ref 8.9–10.3)
Chloride: 101 mmol/L (ref 98–111)
Creatinine, Ser: 1.56 mg/dL — ABNORMAL HIGH (ref 0.61–1.24)
GFR calc Af Amer: 58 mL/min — ABNORMAL LOW (ref >60)
GFR calc non Af Amer: 50 mL/min — ABNORMAL LOW (ref >60)
Glucose, Bld: 70 mg/dL (ref 70–99)
Potassium: 4.8 mmol/L (ref 3.5–5.1)
Sodium: 138 mmol/L (ref 135–145)
Total Bilirubin: 0.6 mg/dL (ref 0.3–1.2)
Total Protein: 7.1 g/dL (ref 6.5–8.1)

## 2019-11-26 LAB — CBC
HCT: 41.2 % (ref 39.0–52.0)
Hemoglobin: 14.1 g/dL (ref 13.0–17.0)
MCH: 31.1 pg (ref 26.0–34.0)
MCHC: 34.2 g/dL (ref 30.0–36.0)
MCV: 90.7 fL (ref 80.0–100.0)
Platelets: 349 10*3/uL (ref 150–400)
RBC: 4.54 MIL/uL (ref 4.22–5.81)
RDW: 15.6 % — ABNORMAL HIGH (ref 11.5–15.5)
WBC: 4.2 10*3/uL (ref 4.0–10.5)
nRBC: 0 % (ref 0.0–0.2)

## 2019-11-26 LAB — URINALYSIS, ROUTINE W REFLEX MICROSCOPIC
Bilirubin Urine: NEGATIVE
Glucose, UA: NEGATIVE mg/dL
Hgb urine dipstick: NEGATIVE
Ketones, ur: NEGATIVE mg/dL
Leukocytes,Ua: NEGATIVE
Nitrite: NEGATIVE
Protein, ur: NEGATIVE mg/dL
Specific Gravity, Urine: 1.017 (ref 1.005–1.030)
pH: 5 (ref 5.0–8.0)

## 2019-11-26 LAB — LIPASE, BLOOD: Lipase: 31 U/L (ref 11–51)

## 2019-11-26 MED ORDER — KETOROLAC TROMETHAMINE 15 MG/ML IJ SOLN
15.0000 mg | Freq: Once | INTRAMUSCULAR | Status: AC
  Administered 2019-11-26: 15 mg via INTRAVENOUS
  Filled 2019-11-26: qty 1

## 2019-11-26 MED ORDER — PROCHLORPERAZINE EDISYLATE 10 MG/2ML IJ SOLN
10.0000 mg | Freq: Once | INTRAMUSCULAR | Status: AC
  Administered 2019-11-26: 10 mg via INTRAVENOUS
  Filled 2019-11-26: qty 2

## 2019-11-26 MED ORDER — DIPHENHYDRAMINE HCL 50 MG/ML IJ SOLN
25.0000 mg | Freq: Once | INTRAMUSCULAR | Status: AC
  Administered 2019-11-26: 25 mg via INTRAVENOUS
  Filled 2019-11-26: qty 1

## 2019-11-26 MED ORDER — ONDANSETRON HCL 4 MG/2ML IJ SOLN
4.0000 mg | Freq: Once | INTRAMUSCULAR | Status: AC
  Administered 2019-11-26: 4 mg via INTRAVENOUS
  Filled 2019-11-26: qty 2

## 2019-11-26 MED ORDER — SODIUM CHLORIDE 0.9 % IV BOLUS
1000.0000 mL | Freq: Once | INTRAVENOUS | Status: AC
  Administered 2019-11-26: 1000 mL via INTRAVENOUS

## 2019-11-26 MED ORDER — SODIUM CHLORIDE 0.9% FLUSH: 3.0000 mL | Freq: Once | INTRAVENOUS | Status: DC

## 2019-11-26 NOTE — ED Notes (Signed)
Patient verbalizes understanding of discharge instructions. Opportunity for questioning and answers were provided. Armband removed by staff, pt discharged from ED ambulatory.   

## 2019-11-26 NOTE — ED Notes (Signed)
Urine sample sent down to lab WITH culture 

## 2019-11-26 NOTE — ED Provider Notes (Signed)
[facility] EMERGENCY DEPARTMENT Provider Note   CSN: [id number] Arrival date & time: 11/26/19  1542     History No chief complaint on file.   Dean Howard is a 54 y.o. male.  54 y.o male with a PMH of CAD, Tick fever presents to the ED with a chief complaint of abdominal pain x 3 days. Patient reports he has been out of his medications including zyprexa, vylar, tramadol been experiencing severe abdominal pain, has had hot flashes, migraines, has not slept or ate in the past 3 days.  Also endorses chills, currently living with his sister.  Reports he has been unable to refill this medication that he currently gets prescribed by [facility].  Patient does have a prior history of bipolar, schizophrenia, does seem anxious during interview.  Also endorses shortness of breath.  Also states he has been bleeding from his rectum, has seen blood on the tissue and on the toilet, bright red.  No chest pain, urinary symptoms. Last beer this morning.   The history is provided by the patient.       Past Medical History:  Diagnosis Date  . Coronary artery disease   . MI, old   . Tick fever     Patient Active Problem List   Diagnosis Date Noted  . Drug-induced injury of liver 08/04/2019  . Alcohol abuse 08/04/2019  . MDD (major depressive disorder), recurrent severe, without psychosis (HCC) 08/03/2019  . Pancytopenia (HCC) 07/31/2019  . Normocytic anemia 07/31/2019  . LFT elevation   . Overdose by acetaminophen 07/26/2019  . Thrombocytopenia (HCC)   . Acetaminophen toxicity, intentional self-harm, initial encounter (HCC)   . Suicidal behavior with attempted self-injury (HCC)   . Schizo-affective schizophrenia, chronic condition with acute exacerbation (HCC) 07/21/2018  . Bipolar 1 disorder, mixed, moderate (HCC)   . Adjustment disorder 07/20/2016  . MDD (major depressive disorder), recurrent, severe, with psychosis (HCC) 07/18/2016  . Adjustment disorder with mixed  disturbance of emotions and conduct 06/04/2016  . 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 CHEST PAIN, PLEURITIC 09/16/2007    Past Surgical History:  Procedure Laterality Date  . HERNIA REPAIR    . KNEE SURGERY         No family history on file.  Social History   Tobacco Use  . Smoking status: Current Every Day Smoker    Types: Cigars    Last attempt to quit: 03/04/2016    Years since quitting: 3.7  . Smokeless tobacco: Never Used  Substance Use Topics  . Alcohol use: Yes    Comment: Daily   . Drug use: Yes    Types: Marijuana    Comment: Daily     Home Medications Prior to Admission medications   Medication Sig Start Date End Date Taking? Authorizing Provider  cariprazine 4.5 MG CAPS Take 1 capsule (4.5 mg total) by mouth daily. Patient not taking: Reported on 11/26/2019 08/06/19   Money, Gerlene Burdock, FNP  lithium carbonate (ESKALITH) 450 MG CR tablet Take 1 tablet (450 mg total) by mouth at bedtime. Patient not taking: Reported on 11/26/2019 08/05/19   Money, Gerlene Burdock, FNP  temazepam (RESTORIL) 15 MG capsule Take 1 capsule (15 mg total) by mouth at bedtime. Patient not taking: Reported on 11/26/2019 08/05/19   Money, Gerlene Burdock, FNP    Allergies    Patient has no known allergies.  Review of Systems   Review of Systems  Constitutional: Negative for fever.  HENT:  Negative for sore throat.   Respiratory: Negative for shortness of breath.   Cardiovascular: Negative for chest pain.  Gastrointestinal: Positive for abdominal pain, nausea and vomiting.  Genitourinary: Negative for difficulty urinating and flank pain.  Musculoskeletal: Negative for back pain.  Neurological: Negative for light-headedness and headaches.  All other systems reviewed and are negative.   Physical Exam Updated Vital Signs BP 115/77 (BP Location: Right Arm)   Pulse 74   Temp 98.3 F (36.8 C) (Oral)   Resp 17   Ht 6\' 3"  (1.905 m)   Wt 77.1 kg   SpO2 96%   BMI 21.25 kg/m    Physical Exam Vitals and nursing note reviewed.  Constitutional:      Appearance: He is well-developed.  HENT:     Head: Normocephalic and atraumatic.  Eyes:     General: No scleral icterus.    Pupils: Pupils are equal, round, and reactive to light.  Cardiovascular:     Rate and Rhythm: Normal rate.     Heart sounds: Normal heart sounds.  Pulmonary:     Effort: Pulmonary effort is normal.     Breath sounds: Normal breath sounds. No wheezing.  Chest:     Chest wall: No tenderness.  Abdominal:     General: Bowel sounds are normal. There is no distension.     Palpations: Abdomen is soft.     Tenderness: There is generalized abdominal tenderness. There is no right CVA tenderness, left CVA tenderness or guarding. Negative signs include McBurney's sign.  Musculoskeletal:        General: No tenderness or deformity.     Cervical back: Normal range of motion.  Skin:    General: Skin is warm and dry.  Neurological:     Mental Status: He is alert and oriented to person, place, and time.  Psychiatric:        Mood and Affect: Mood is anxious.        Speech: Speech is rapid and pressured.        Behavior: Behavior is cooperative.     ED Results / Procedures / Treatments   Labs (all labs ordered are listed, but only abnormal results are displayed) Labs Reviewed  COMPREHENSIVE METABOLIC PANEL - Abnormal; Notable for the following components:      Result Value   Creatinine, Ser 1.56 (*)    Albumin 3.4 (*)    AST 101 (*)    ALT 75 (*)    GFR calc non Af Amer 50 (*)    GFR calc Af Amer 58 (*)    All other components within normal limits  CBC - Abnormal; Notable for the following components:   RDW 15.6 (*)    All other components within normal limits  LIPASE, BLOOD  URINALYSIS, ROUTINE W REFLEX MICROSCOPIC  POC OCCULT BLOOD, ED    EKG None  Radiology No results found.  Procedures Procedures (including critical care time)  Medications Ordered in ED Medications   sodium chloride flush (NS) 0.9 % injection 3 mL (3 mLs Intravenous Not Given 11/26/19 1832)  sodium chloride 0.9 % bolus 1,000 mL (0 mLs Intravenous Stopped 11/26/19 1937)  ondansetron (ZOFRAN) injection 4 mg (4 mg Intravenous Given 11/26/19 1831)  diphenhydrAMINE (BENADRYL) injection 25 mg (25 mg Intravenous Given 11/26/19 2246)  prochlorperazine (COMPAZINE) injection 10 mg (10 mg Intravenous Given 11/26/19 2246)  ketorolac (TORADOL) 15 MG/ML injection 15 mg (15 mg Intravenous Given 11/26/19 2246)    ED Course  I have  reviewed the triage vital signs and the nursing notes.  Pertinent labs & imaging results that were available during my care of the patient were reviewed by me and considered in my medical decision making (see chart for details).    MDM Rules/Calculators/A&P    Patient with a PMH of mental health illness presents to the ED with a chief complaint of abdominal pain x 3 days.  Patient reports he has been out of his medication for the past 3 days, he has been experiencing abdominal pain.  Reports he has not slept, even in the past 3 days.  Also endorses chills.  Currently presents at home with sister.  Endorses nausea but has not had any vomiting, has seen blood in the tissue on the toilet.  No chest pain or urinary symptoms.  Last drink of beer this morning.  No SI, HI, visual or auditory hallucination.  During evaluation patient is anxious, does talk in a tangential manner.  Lungs are clear to auscultation.  Abdomen is soft, nontender to palpation.  Pulses are present and symmetric throughout.  He is neurologically intact.  The rotation of labs by me reveal a CBC without leukocytosis, hemoglobin is within normal limits.  CMP without any electrolyte normality.  Creatinine remarkable for mild AKI, provided with a liter of fluids.  LFTs are slightly elevated however this is improved from his previous visits.  UA without any nitrites, leukocytes no signs of infection.  Lipase is within normal  limits.  08:00 PM patient reevaluated by me, reports nausea has improved, has completed p.o. challenge.  He is now reporting a headache.  Given headache cocktail at this time.  11:06 PM patient reevaluated by me after fluids and medication given.  He reports symptoms have improved.  We discussed that he will need to obtain prescriptions from [facility] at this time.  Patient is hemodynamically stable, tolerating p.o. without any further emesis episodes while in the ED.  Patient is stable for discharge at this time.   Portions of this note were generated with Scientist, clinical (histocompatibility and immunogenetics). Dictation errors may occur despite best attempts at proofreading.  Final Clinical Impression(s) / ED Diagnoses Final diagnoses:  Generalized abdominal pain    Rx / DC Orders ED Discharge Orders    None       Freddy Jaksch 11/26/19 2307    Gerhard Munch, MD 11/27/19 2054

## 2019-11-26 NOTE — ED Triage Notes (Signed)
Patient complains of abd. Pain and noted blood in stool today. Patient alert and oriented, has not been taking meds for 1 month. Patient has BH history but here for GI complaints

## 2019-11-26 NOTE — Discharge Instructions (Addendum)
Your laboratory results were within normal limits.  You will need to obtain medication refills from [facility], for your behavior medical history.

## 2019-11-28 ENCOUNTER — Inpatient Hospital Stay (HOSPITAL_COMMUNITY)
Admission: EM | Admit: 2019-11-28 | Discharge: 2019-11-30 | DRG: 640 | Disposition: A | Discharge status: Home / Self Care | Payer: Medicare Other | Attending: Internal Medicine | Admitting: Internal Medicine

## 2019-11-28 ENCOUNTER — Encounter (HOSPITAL_COMMUNITY): Payer: Self-pay | Admitting: Emergency Medicine

## 2019-11-28 ENCOUNTER — Inpatient Hospital Stay (HOSPITAL_COMMUNITY): Payer: Medicare Other

## 2019-11-28 ENCOUNTER — Emergency Department (HOSPITAL_COMMUNITY): Payer: Medicare Other

## 2019-11-28 DIAGNOSIS — Z915 Personal history of self-harm: Secondary | ICD-10-CM

## 2019-11-28 DIAGNOSIS — D649 Anemia, unspecified: Secondary | ICD-10-CM | POA: Diagnosis present

## 2019-11-28 DIAGNOSIS — D696 Thrombocytopenia, unspecified: Secondary | ICD-10-CM | POA: Diagnosis present

## 2019-11-28 DIAGNOSIS — T383X2A Poisoning by insulin and oral hypoglycemic [antidiabetic] drugs, intentional self-harm, initial encounter: Secondary | ICD-10-CM

## 2019-11-28 DIAGNOSIS — F319 Bipolar disorder, unspecified: Secondary | ICD-10-CM | POA: Diagnosis present

## 2019-11-28 DIAGNOSIS — G92 Toxic encephalopathy: Secondary | ICD-10-CM | POA: Diagnosis present

## 2019-11-28 DIAGNOSIS — A539 Syphilis, unspecified: Secondary | ICD-10-CM | POA: Diagnosis present

## 2019-11-28 DIAGNOSIS — I252 Old myocardial infarction: Secondary | ICD-10-CM | POA: Diagnosis not present

## 2019-11-28 DIAGNOSIS — I444 Left anterior fascicular block: Secondary | ICD-10-CM | POA: Diagnosis present

## 2019-11-28 DIAGNOSIS — F259 Schizoaffective disorder, unspecified: Secondary | ICD-10-CM | POA: Diagnosis present

## 2019-11-28 DIAGNOSIS — I2510 Atherosclerotic heart disease of native coronary artery without angina pectoris: Secondary | ICD-10-CM | POA: Diagnosis present

## 2019-11-28 DIAGNOSIS — T50902A Poisoning by unspecified drugs, medicaments and biological substances, intentional self-harm, initial encounter: Secondary | ICD-10-CM

## 2019-11-28 DIAGNOSIS — Z20822 Contact with and (suspected) exposure to covid-19: Secondary | ICD-10-CM | POA: Diagnosis present

## 2019-11-28 DIAGNOSIS — T50996A Underdosing of other drugs, medicaments and biological substances, initial encounter: Secondary | ICD-10-CM | POA: Diagnosis present

## 2019-11-28 DIAGNOSIS — K719 Toxic liver disease, unspecified: Secondary | ICD-10-CM | POA: Diagnosis present

## 2019-11-28 DIAGNOSIS — Y92009 Unspecified place in unspecified non-institutional (private) residence as the place of occurrence of the external cause: Secondary | ICD-10-CM

## 2019-11-28 DIAGNOSIS — F1010 Alcohol abuse, uncomplicated: Secondary | ICD-10-CM | POA: Diagnosis present

## 2019-11-28 DIAGNOSIS — E162 Hypoglycemia, unspecified: Secondary | ICD-10-CM | POA: Diagnosis present

## 2019-11-28 DIAGNOSIS — R569 Unspecified convulsions: Secondary | ICD-10-CM | POA: Diagnosis present

## 2019-11-28 DIAGNOSIS — F329 Major depressive disorder, single episode, unspecified: Secondary | ICD-10-CM | POA: Insufficient documentation

## 2019-11-28 DIAGNOSIS — Z91120 Patient's intentional underdosing of medication regimen due to financial hardship: Secondary | ICD-10-CM | POA: Diagnosis not present

## 2019-11-28 DIAGNOSIS — F17290 Nicotine dependence, other tobacco product, uncomplicated: Secondary | ICD-10-CM | POA: Diagnosis present

## 2019-11-28 DIAGNOSIS — R739 Hyperglycemia, unspecified: Secondary | ICD-10-CM | POA: Diagnosis not present

## 2019-11-28 LAB — CBC WITH DIFFERENTIAL/PLATELET
Abs Immature Granulocytes: 0 10*3/uL (ref 0.00–0.07)
Basophils Absolute: 0 10*3/uL (ref 0.0–0.1)
Basophils Relative: 0 %
Eosinophils Absolute: 0.1 10*3/uL (ref 0.0–0.5)
Eosinophils Relative: 3 %
HCT: 32.4 % — ABNORMAL LOW (ref 39.0–52.0)
Hemoglobin: 10.8 g/dL — ABNORMAL LOW (ref 13.0–17.0)
Immature Granulocytes: 0 %
Lymphocytes Relative: 26 %
Lymphs Abs: 1.1 10*3/uL (ref 0.7–4.0)
MCH: 31.8 pg (ref 26.0–34.0)
MCHC: 33.3 g/dL (ref 30.0–36.0)
MCV: 95.3 fL (ref 80.0–100.0)
Monocytes Absolute: 0.4 10*3/uL (ref 0.1–1.0)
Monocytes Relative: 10 %
Neutro Abs: 2.6 10*3/uL (ref 1.7–7.7)
Neutrophils Relative %: 61 %
Platelets: 215 10*3/uL (ref 150–400)
RBC: 3.4 MIL/uL — ABNORMAL LOW (ref 4.22–5.81)
RDW: 15.8 % — ABNORMAL HIGH (ref 11.5–15.5)
WBC: 4.3 10*3/uL (ref 4.0–10.5)
nRBC: 0 % (ref 0.0–0.2)

## 2019-11-28 LAB — I-STAT CHEM 8, ED
BUN: 17 mg/dL (ref 6–20)
Calcium, Ion: 1.15 mmol/L (ref 1.15–1.40)
Chloride: 108 mmol/L (ref 98–111)
Creatinine, Ser: 1.5 mg/dL — ABNORMAL HIGH (ref 0.61–1.24)
Glucose, Bld: 81 mg/dL (ref 70–99)
HCT: 32 % — ABNORMAL LOW (ref 39.0–52.0)
Hemoglobin: 10.9 g/dL — ABNORMAL LOW (ref 13.0–17.0)
Potassium: 4.2 mmol/L (ref 3.5–5.1)
Sodium: 142 mmol/L (ref 135–145)
TCO2: 23 mmol/L (ref 22–32)

## 2019-11-28 LAB — URINALYSIS, ROUTINE W REFLEX MICROSCOPIC
Bacteria, UA: NONE SEEN
Bilirubin Urine: NEGATIVE
Glucose, UA: >=500 mg/dL — AB
Hgb urine dipstick: NEGATIVE
Ketones, ur: NEGATIVE mg/dL
Leukocytes,Ua: NEGATIVE
Nitrite: NEGATIVE
Protein, ur: NEGATIVE mg/dL
Specific Gravity, Urine: 1.008 (ref 1.005–1.030)
pH: 6 (ref 5.0–8.0)

## 2019-11-28 LAB — CBG MONITORING, ED
Glucose-Capillary: 108 mg/dL — ABNORMAL HIGH (ref 70–99)
Glucose-Capillary: 128 mg/dL — ABNORMAL HIGH (ref 70–99)
Glucose-Capillary: 152 mg/dL — ABNORMAL HIGH (ref 70–99)
Glucose-Capillary: 227 mg/dL — ABNORMAL HIGH (ref 70–99)
Glucose-Capillary: 38 mg/dL — CL (ref 70–99)
Glucose-Capillary: 41 mg/dL — CL (ref 70–99)
Glucose-Capillary: 41 mg/dL — CL (ref 70–99)
Glucose-Capillary: 47 mg/dL — ABNORMAL LOW (ref 70–99)
Glucose-Capillary: 63 mg/dL — ABNORMAL LOW (ref 70–99)
Glucose-Capillary: 86 mg/dL (ref 70–99)
Glucose-Capillary: 89 mg/dL (ref 70–99)
Glucose-Capillary: 93 mg/dL (ref 70–99)

## 2019-11-28 LAB — MAGNESIUM: Magnesium: 1.8 mg/dL (ref 1.7–2.4)

## 2019-11-28 LAB — COMPREHENSIVE METABOLIC PANEL
ALT: 47 U/L — ABNORMAL HIGH (ref 0–44)
AST: 71 U/L — ABNORMAL HIGH (ref 15–41)
Albumin: 2.7 g/dL — ABNORMAL LOW (ref 3.5–5.0)
Alkaline Phosphatase: 81 U/L (ref 38–126)
Anion gap: 11 (ref 5–15)
BUN: 15 mg/dL (ref 6–20)
CO2: 18 mmol/L — ABNORMAL LOW (ref 22–32)
Calcium: 8.4 mg/dL — ABNORMAL LOW (ref 8.9–10.3)
Chloride: 111 mmol/L (ref 98–111)
Creatinine, Ser: 1.39 mg/dL — ABNORMAL HIGH (ref 0.61–1.24)
GFR calc Af Amer: >60 mL/min (ref >60)
GFR calc non Af Amer: 58 mL/min — ABNORMAL LOW (ref >60)
Glucose, Bld: 82 mg/dL (ref 70–99)
Potassium: 4.6 mmol/L (ref 3.5–5.1)
Sodium: 140 mmol/L (ref 135–145)
Total Bilirubin: 0.7 mg/dL (ref 0.3–1.2)
Total Protein: 5.1 g/dL — ABNORMAL LOW (ref 6.5–8.1)

## 2019-11-28 LAB — RAPID URINE DRUG SCREEN, HOSP PERFORMED
Amphetamines: NOT DETECTED
Barbiturates: NOT DETECTED
Benzodiazepines: POSITIVE — AB
Cocaine: NOT DETECTED
Opiates: NOT DETECTED
Tetrahydrocannabinol: POSITIVE — AB

## 2019-11-28 LAB — LACTIC ACID, PLASMA
Lactic Acid, Venous: 1.6 mmol/L (ref 0.5–1.9)
Lactic Acid, Venous: 1.5 mmol/L (ref 0.5–1.9)

## 2019-11-28 LAB — ACETAMINOPHEN LEVEL: Acetaminophen (Tylenol), Serum: <10 ug/mL — ABNORMAL LOW (ref 10–30)

## 2019-11-28 LAB — ETHANOL: Alcohol, Ethyl (B): 11 mg/dL — ABNORMAL HIGH (ref <10)

## 2019-11-28 LAB — SALICYLATE LEVEL: Salicylate Lvl: <7 mg/dL — ABNORMAL LOW (ref 7.0–30.0)

## 2019-11-28 LAB — SARS CORONAVIRUS 2 BY RT PCR (HOSPITAL ORDER, PERFORMED IN ~~LOC~~ HOSPITAL LAB): SARS Coronavirus 2: NEGATIVE

## 2019-11-28 LAB — CK: Total CK: 424 U/L — ABNORMAL HIGH (ref 49–397)

## 2019-11-28 LAB — LITHIUM LEVEL: Lithium Lvl: <0.06 mmol/L — ABNORMAL LOW (ref 0.60–1.20)

## 2019-11-28 MED ORDER — OCTREOTIDE ACETATE 50 MCG/ML IJ SOLN
50.0000 ug | Freq: Three times a day (TID) | INTRAMUSCULAR | Status: DC
  Administered 2019-11-28: 50 ug via INTRAVENOUS
  Filled 2019-11-28 (×4): qty 1

## 2019-11-28 MED ORDER — DEXTROSE 50 % IV SOLN
INTRAVENOUS | Status: AC
  Administered 2019-11-28: 50 mL via INTRAVENOUS
  Filled 2019-11-28: qty 50

## 2019-11-28 MED ORDER — THIAMINE HCL 100 MG/ML IJ SOLN: 100.0000 mg | Freq: Every day | INTRAMUSCULAR | Status: DC

## 2019-11-28 MED ORDER — METHYLPREDNISOLONE SODIUM SUCC 40 MG IJ SOLR
40.0000 mg | Freq: Four times a day (QID) | INTRAMUSCULAR | Status: DC
  Administered 2019-11-28 – 2019-11-29 (×2): 40 mg via INTRAVENOUS
  Filled 2019-11-28 (×4): qty 1

## 2019-11-28 MED ORDER — DEXTROSE 50 % IV SOLN: 1.0000 | Freq: Once | INTRAVENOUS | Status: AC

## 2019-11-28 MED ORDER — SODIUM CHLORIDE 0.9 % IV SOLN
1.0000 mg | Freq: Every day | INTRAVENOUS | Status: DC
  Administered 2019-11-28: 1 mg via INTRAVENOUS
  Filled 2019-11-28 (×2): qty 0.2

## 2019-11-28 MED ORDER — DEXTROSE 50 % IV SOLN
INTRAVENOUS | Status: AC
  Administered 2019-11-28: 50 mL
  Filled 2019-11-28: qty 50

## 2019-11-28 MED ORDER — DEXTROSE 10 % IV SOLN: INTRAVENOUS | Status: DC

## 2019-11-28 MED ORDER — LORAZEPAM 2 MG/ML IJ SOLN: 1.0000 mg | INTRAMUSCULAR | Status: DC | PRN

## 2019-11-28 MED ORDER — DEXTROSE 50 % IV SOLN
INTRAVENOUS | Status: AC
  Filled 2019-11-28: qty 50

## 2019-11-28 MED ORDER — CARIPRAZINE HCL 3 MG PO CAPS
4.5000 mg | ORAL_CAPSULE | Freq: Every day | ORAL | Status: DC
  Filled 2019-11-28 (×2): qty 3
  Filled 2019-11-28 (×2): qty 1

## 2019-11-28 MED ORDER — DOCUSATE SODIUM 100 MG PO CAPS: 100.0000 mg | ORAL_CAPSULE | Freq: Two times a day (BID) | ORAL | Status: DC | PRN

## 2019-11-28 MED ORDER — POLYETHYLENE GLYCOL 3350 17 G PO PACK: 17.0000 g | PACK | Freq: Every day | ORAL | Status: DC | PRN

## 2019-11-28 MED ORDER — LACTATED RINGERS IV BOLUS
1000.0000 mL | Freq: Once | INTRAVENOUS | Status: AC
  Administered 2019-11-28: 1000 mL via INTRAVENOUS

## 2019-11-28 MED ORDER — DEXTROSE 50 % IV SOLN
1.0000 | Freq: Once | INTRAVENOUS | Status: AC
  Administered 2019-11-28: 50 mL via INTRAVENOUS

## 2019-11-28 MED ORDER — DEXTROSE 50 % IV SOLN
50.0000 mL | Freq: Once | INTRAVENOUS | Status: AC
  Administered 2019-11-28: 50 mL via INTRAVENOUS

## 2019-11-28 MED ORDER — THIAMINE HCL 100 MG/ML IJ SOLN
100.0000 mg | Freq: Once | INTRAMUSCULAR | Status: AC
  Administered 2019-11-28: 100 mg via INTRAVENOUS
  Filled 2019-11-28: qty 2

## 2019-11-28 MED ORDER — CARIPRAZINE HCL 3 MG PO CAPS
4.5000 mg | ORAL_CAPSULE | Freq: Every day | ORAL | Status: DC
  Filled 2019-11-28: qty 1

## 2019-11-28 NOTE — ED Notes (Signed)
Called staffing to see if sitter was available, no sitters available at the moment

## 2019-11-28 NOTE — ED Provider Notes (Signed)
[facility] Provider Note   CSN: [id number] Arrival date & time: 11/28/19  1621     History Chief Complaint  Patient presents with  . Seizures  . Hypoglycemia    Dean Howard is a 54 y.o. male with a past medical history of alcohol abuse, major depressive disorder, schizoaffective disorder, prior attempts attempted intentional overdose presents today for evaluation of seizures and hypoglycemia.  History obtained from EMS, patient's sister, patient, chart review.  Patient sister reports that she went into the kitchen and found him on the floor under the table.  She states that he was blue and shaking.  She states that he was not breathing well.  He does not have a history of diabetes.  As far she is aware he has not had seizures before.  Patient had 2 drinks today according to his sister.  With EMS patient was noted to be hypoglycemic with a sugar simply reading low.  He was tense however was not having tonic-clonic seizure-like activity and was combative.  He was given 5 mg of Versed IM and IV and glucagon.  He did not respond to the glucagon and was given D10 and IV fluids as his pressure was in the 70s to 80s.    Patient tells me his name is Dean Howard.  He tells me he took his brothers insulin.  When I asked him why he says he wants it all to stop and that this is his 7th time trying.  I asked him to clarify trying what and he would not answer.  He denies any new pain.   HPI     Past Medical History:  Diagnosis Date  . Coronary artery disease   . MI, old   . Tick fever     Patient Active Problem List   Diagnosis Date Noted  . Hypoglycemia 11/28/2019  . Major depressive disorder   . Seizure (HCC)   . Drug-induced injury of liver 08/04/2019  . Alcohol abuse 08/04/2019  . MDD (major depressive disorder), recurrent severe, without psychosis (HCC) 08/03/2019  . Pancytopenia (HCC) 07/31/2019  . Normocytic anemia 07/31/2019  . LFT  elevation   . Overdose by acetaminophen 07/26/2019  . Thrombocytopenia (HCC)   . Acetaminophen toxicity, intentional self-harm, initial encounter (HCC)   . Suicidal behavior with attempted self-injury (HCC)   . Schizo-affective schizophrenia, chronic condition with acute exacerbation (HCC) 07/21/2018  . Bipolar 1 disorder, mixed, moderate (HCC)   . Adjustment disorder 07/20/2016  . MDD (major depressive disorder), recurrent, severe, with psychosis (HCC) 07/18/2016  . 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 CHEST PAIN, PLEURITIC 09/16/2007    Past Surgical History:  Procedure Laterality Date  . HERNIA REPAIR    . KNEE SURGERY         No family history on file.  Social History   Tobacco Use  . Smoking status: Current Every Day Smoker    Types: Cigars    Last attempt to quit: 03/04/2016    Years since quitting: 3.7  . Smokeless tobacco: Never Used  Substance Use Topics  . Alcohol use: Yes    Comment: Daily   . Drug use: Yes    Types: Marijuana    Comment: Daily     Home Medications Prior to Admission medications   Medication Sig Start Date End Date Taking? Authorizing Provider  cariprazine 4.5 MG CAPS Take 1  capsule (4.5 mg total) by mouth daily. Patient not taking: Reported on 11/26/2019 08/06/19   Money, Lowry Ram, FNP  lithium carbonate (ESKALITH) 450 MG CR tablet Take 1 tablet (450 mg total) by mouth at bedtime. Patient not taking: Reported on 11/28/2019 08/05/19   Money, Lowry Ram, FNP  temazepam (RESTORIL) 15 MG capsule Take 1 capsule (15 mg total) by mouth at bedtime. Patient not taking: Reported on 11/26/2019 08/05/19   Money, Lowry Ram, FNP    Allergies    Patient has no known allergies.  Review of Systems   Review of Systems  Unable to perform ROS: Mental status change    Physical Exam Updated Vital Signs BP 115/82 (BP Location: Right Arm)   Pulse (!) 55    Temp 97.6 F (36.4 C) (Oral)   Resp 17   Ht 6\' 3"  (1.905 m)   Wt 77.1 kg   SpO2 97%   BMI 21.25 kg/m   Physical Exam Vitals and nursing note reviewed.  Constitutional:      General: He is not in acute distress.    Appearance: He is well-developed. He is not diaphoretic.  HENT:     Head: Normocephalic and atraumatic.  Eyes:     General: No scleral icterus.       Right eye: No discharge.        Left eye: No discharge.     Conjunctiva/sclera: Conjunctivae normal.  Cardiovascular:     Rate and Rhythm: Normal rate and regular rhythm.     Pulses: Normal pulses.     Heart sounds: Normal heart sounds.  Pulmonary:     Effort: Pulmonary effort is normal. No respiratory distress.     Breath sounds: Normal breath sounds. No stridor.  Abdominal:     General: There is no distension.     Tenderness: There is no abdominal tenderness. There is no guarding.  Musculoskeletal:        General: No deformity.  Skin:    General: Skin is warm and dry.  Neurological:     Mental Status: He is lethargic.     GCS: GCS eye subscore is 2. GCS verbal subscore is 2. GCS motor subscore is 5.     Comments: Patient localizes painful stimuli.  He does not have deviated gaze or abnormal eye movements.  Pupils are small however equal round and reactive to light.    Psychiatric:     Comments: Suicidal, limited due to condition     ED Results / Procedures / Treatments   Labs (all labs ordered are listed, but only abnormal results are displayed) Labs Reviewed  COMPREHENSIVE METABOLIC PANEL - Abnormal; Notable for the following components:      Result Value   CO2 18 (*)    Creatinine, Ser 1.39 (*)    Calcium 8.4 (*)    Total Protein 5.1 (*)    Albumin 2.7 (*)    AST 71 (*)    ALT 47 (*)    GFR calc non Af Amer 58 (*)    All other components within normal limits  CBC WITH DIFFERENTIAL/PLATELET - Abnormal; Notable for the following components:   RBC 3.40 (*)    Hemoglobin 10.8 (*)    HCT 32.4 (*)      RDW 15.8 (*)    All other components within normal limits  SALICYLATE LEVEL - Abnormal; Notable for the following components:   Salicylate Lvl <6.6 (*)    All other components within normal limits  ACETAMINOPHEN LEVEL - Abnormal; Notable for the following components:   Acetaminophen (Tylenol), Serum <10 (*)    All other components within normal limits  ETHANOL - Abnormal; Notable for the following components:   Alcohol, Ethyl (B) 11 (*)    All other components within normal limits  RAPID URINE DRUG SCREEN, HOSP PERFORMED - Abnormal; Notable for the following components:   Benzodiazepines POSITIVE (*)    Tetrahydrocannabinol POSITIVE (*)    All other components within normal limits  URINALYSIS, ROUTINE W REFLEX MICROSCOPIC - Abnormal; Notable for the following components:   Color, Urine STRAW (*)    Glucose, UA >=500 (*)    All other components within normal limits  CK - Abnormal; Notable for the following components:   Total CK 424 (*)    All other components within normal limits  LITHIUM LEVEL - Abnormal; Notable for the following components:   Lithium Lvl <0.06 (*)    All other components within normal limits  CBG MONITORING, ED - Abnormal; Notable for the following components:   Glucose-Capillary 128 (*)    All other components within normal limits  I-STAT CHEM 8, ED - Abnormal; Notable for the following components:   Creatinine, Ser 1.50 (*)    Hemoglobin 10.9 (*)    HCT 32.0 (*)    All other components within normal limits  CBG MONITORING, ED - Abnormal; Notable for the following components:   Glucose-Capillary 41 (*)    All other components within normal limits  CBG MONITORING, ED - Abnormal; Notable for the following components:   Glucose-Capillary 63 (*)    All other components within normal limits  CBG MONITORING, ED - Abnormal; Notable for the following components:   Glucose-Capillary 38 (*)    All other components within normal limits  CBG MONITORING, ED -  Abnormal; Notable for the following components:   Glucose-Capillary 108 (*)    All other components within normal limits  CBG MONITORING, ED - Abnormal; Notable for the following components:   Glucose-Capillary 47 (*)    All other components within normal limits  CBG MONITORING, ED - Abnormal; Notable for the following components:   Glucose-Capillary 41 (*)    All other components within normal limits  SARS CORONAVIRUS 2 BY RT PCR (HOSPITAL ORDER, PERFORMED IN [facility])  LACTIC ACID, PLASMA  MAGNESIUM  LACTIC ACID, PLASMA  BASIC METABOLIC PANEL  CBC  C-PEPTIDE  MAGNESIUM  CBG MONITORING, ED  CBG MONITORING, ED  CBG MONITORING, ED    EKG EKG Interpretation  Date/Time:  Monday November 28 2019 17:15:02 EDT Ventricular Rate:  82 PR Interval:    QRS Duration: 104 QT Interval:  423 QTC Calculation: 495 R Axis:   -56 Text Interpretation: Sinus rhythm Left anterior fascicular block Low voltage, precordial leads Abnormal R-wave progression, late transition Borderline prolonged QT interval Since last tracing rate faster Confirmed by Jacalyn Lefevre [phone]) on 11/28/2019 5:23:11 PM   Radiology CT Head Wo Contrast  Result Date: 11/28/2019 CLINICAL DATA:  Seizure. EXAM: CT HEAD WITHOUT CONTRAST TECHNIQUE: Contiguous axial images were obtained from the base of the skull through the vertex without intravenous contrast. COMPARISON:  CT head dated January 14, 2010. FINDINGS: Brain: No evidence of acute infarction, hemorrhage, hydrocephalus, extra-axial collection or mass lesion/mass effect. Vascular: No hyperdense vessel. Small foci of air in both cavernous sinuses, likely iatrogenic. Skull: Normal. Negative for fracture or focal lesion. Sinuses/Orbits: No acute finding. Other: None. IMPRESSION: 1. No acute intracranial abnormality. Electronically  Signed   By: Obie Dredge M.D.   On: 11/28/2019 21:09   DG Chest Port 1 View  Result Date: 11/28/2019 CLINICAL DATA:  Seizures and  hypoglycemia. EXAM: PORTABLE CHEST 1 VIEW COMPARISON:  07/26/2019 FINDINGS: Numerous leads and wires project over the chest. Midline trachea. Normal heart size and mediastinal contours. No pleural effusion or pneumothorax. Clear lungs. IMPRESSION: No active disease. Electronically Signed   By: Jeronimo Greaves M.D.   On: 11/28/2019 17:30    Procedures .Critical Care Performed by: Cristina Gong, PA-C Authorized by: Cristina Gong, PA-C   Critical care provider statement:    Critical care time (minutes):  45   Critical care time was exclusive of:  Separately billable procedures and treating other patients   Critical care was necessary to treat or prevent imminent or life-threatening deterioration of the following conditions:  Endocrine crisis   Critical care was time spent personally by me on the following activities:  Discussions with consultants, evaluation of patient's response to treatment, examination of patient, ordering and performing treatments and interventions, ordering and review of laboratory studies, ordering and review of radiographic studies, pulse oximetry, re-evaluation of patient's condition, obtaining history from patient or surrogate and review of old charts   (including critical care time)  Medications Ordered in ED Medications  dextrose 10 % infusion ( Intravenous Rate/Dose Change 11/28/19 2110)  dextrose 50 % solution (  Not Given 11/28/19 1752)  thiamine (B-1) injection 100 mg (100 mg Intravenous Given 11/28/19 1700)  lactated ringers bolus 1,000 mL (0 mLs Intravenous Stopped 11/28/19 1745)  dextrose 50 % solution 50 mL (50 mLs Intravenous Given 11/28/19 1705)  dextrose 50 % solution (50 mLs  Given 11/28/19 1707)  dextrose 50 % solution 50 mL (50 mLs Intravenous Given 11/28/19 1737)  dextrose 50 % solution 50 mL (50 mLs Intravenous Given 11/28/19 1826)  dextrose 50 % solution 50 mL (50 mLs Intravenous Given 11/28/19 1842)  dextrose 50 % solution 50 mL (50 mLs Intravenous Given  11/28/19 2044)    ED Course  I have reviewed the triage vital signs and the nursing notes.  Pertinent labs & imaging results that were available during my care of the patient were reviewed by me and considered in my medical decision making (see chart for details).  Clinical Course as of Nov 27 2208  Mon Nov 28, 2019  1704 CBG is low, IV glucose and D10 drip ordered.    [EH]  1713 Patient states that it was his brothers insulin he took.     [EH]  1722 I spoke with patient's sister.  She reports that her husband has both insulin and glipizide.  I had her count the pills and insulin pens and reports that they are not any obviously missing.  She checked the trash can and there were no empty pills.   [EH]  1742 I was informed by RN that patient's blood sugar is in the low 40s.  Mental status is mostly unchanged.  Order for 1 amp of D50, increase D10 rate.  Dr. Katrinka Blazing notified, he read with uptitrating the D10 and keeping the D10 and D50 drip running.     [EH]  2106 Had been notified that patient was again hypoglycemic, gave order for 1 amp D50, Dr. Dellie Catholic made aware of hypoglycemia and Bradycardia.    [EH]  2125 I re-evaluated patient as he had been requesting food.  He is awake and alert, appears to have metabolized the benzodiazepines that he  was given in the field.  He is oriented to person, place, and time.  His speech is not slurred.  He is asking urine multiple times for food.  This request was communicated to Dr. Dellie Catholic who said patient can try sips of water and ice chips and if he does ok can have clear liquids. Adrian Prince, RN, made aware.    [EH]    Clinical Course User Index [EH] Norman Clay   MDM Rules/Calculators/A&P                     Patient is a 54 year old man who presents today for evaluation of hypoglycemia and seizure.  He does not have a history of hypoglycemia, diabetes, or seizures.  On initial exam patient is drowsy, he required benzodiazepines in  the field by EMS due to agitation.  He required multiple rounds of D50 and would become hypoglycemic about half an hour after his first amp of D50.  He states that this was his seventh a suicide attempt.  He states that he took his brothers insulin.  I spoke with his sister who reports that patient's brother-in-law, her husband, does indeed take insulin however they do not see any missing.  They did not see any of the glipizide, Wellbutrin, Seroquel, insulin or Metformin missing.    Nevertheless this does appear to be a suicide attempt.  Patient sister states that he has been struggling with depression and not taking his medications recently.    CT head was ordered given that this is a first-time seizure, however I suspect that it is secondary to his hypoglycemia with residual lethargy from getting benzodiazepines in the field.  Chest x-ray is ordered.  Labs are ordered.  Critical care is consulted as patient required multiple rounds of IV D50 despite being on a D10 drip.  Critical care came to see the patient, will admit him, follow remaining labs.     Note: Portions of this report may have been transcribed using voice recognition software. Every effort was made to ensure accuracy; however, inadvertent computerized transcription errors may be present  Final Clinical Impression(s) / ED Diagnoses Final diagnoses:  Hypoglycemia  Seizure (HCC)  Overdose of insulin, intentional self-harm, initial encounter [facility])  Suicide attempt by drug ingestion, initial encounter [facility])    Rx / DC Orders ED Discharge Orders    None       Norman Clay 11/28/19 2211    Jacalyn Lefevre, MD 11/28/19 2329

## 2019-11-28 NOTE — H&P (Signed)
NAME:  Dean Howard, MRN:  [id number], DOB:  Nov 13, 1965, LOS: 0 ADMISSION DATE:  11/28/2019, CONSULTATION DATE:  11/28/19 REFERRING MD: Phylliss Bob - EM  , CHIEF COMPLAINT:  AMS, hypoclycemia  Brief History   54 yo M with refractory hypoglycemia, possible intentional insulin overdose, who may have sustained seizure prior to EMS arrival.   Admitting to ICU   History of present illness   54 yo M PMH Bipolar 1, schizoaffective disorder, MDD with prior SI, who presents to ED 6/7 with AMS, reported seizure-like activity. Found by family on floor of kitchen, blue in color and visibly shaking -- family describes this as seizure-like activity. EMS was dispatched and found pt to be combative, CBG read as  "low," and was given 5mg  versed + glucagon.   Pt reports to EDP that he took his brother's insulin as suicide attempt, when asked further about this he did not respond and has been too encephalopathic for further SI evaluation at time of our exam. Family member denies missing insulin.   Past Medical History  MDD Schizoaffective disorder  Bipolar 1 disorder Suicidal ideation with attempt Drug-induced liver injury APAP toxicity, intentional self harm Secondary syphilis  Normocytic anemia Thrombocytopenia Alcohol abuse Tobacco abuse  Significant Hospital Events   6/7 Admitted to [facility] ICU   Consults:    Procedures:    Significant Diagnostic Tests:  6/7 CT H>   Micro Data:  6/7 SASR Cov2>   Antimicrobials:  na  Interim history/subjective:  Up-titrating dextrose gtt  Objective   Blood pressure 94/60, pulse 68, temperature 97.6 F (36.4 C), temperature source Oral, resp. rate 15, height 6\' 3"  (1.905 m), weight 77.1 kg, SpO2 100 %.       No intake or output data in the 24 hours ending 11/28/19 1800 Filed Weights   11/28/19 1631  Weight: 77.1 kg    Examination: General: WDWN adult M, reclined in stretcher asleep NAD  HENT: NCAT pink mmm trachea midline. Anicteric  sclera Lungs: CTa bilaterally. Snoring respirations. No accessory muscle use Cardiovascular: RRR s1s2 no rgm cap refill < 3 seconds Abdomen: soft flat ndnt + bowel sounds Extremities: Symmetrical bulk and tone no obvious joint deformity No cyanosis or clubbing Neuro: Somnolent, awakens to voice, mumbling speech, falls asleep quickly GU: WNL  Resolved Hospital Problem list     Assessment & Plan:   Hypoglycemia, profound Possible intentional insulin overdose  -no hx DM-- some question as to if pt took family member's insulin as suicide attempt P -D50 gtt, q30 min CBG -r/o insulinoma   MDD, with possible SI -too somnolent for eval  Schizoaffective disorder  Bipolar 1 disorder -home vraylar, lithium, restoril  -has been off home meds x 2 weeks P -suicide precautions -eval for SI when more lucid  -check Lithium level -restarting vraylar, hold Lithium until level back  -may need psych eval   Acute encephalopathy, likely toxic metabolic -hypoglycemia -possible component of post-ictal state as well P -Correct glucose, metabolic abnormalities as able  -monitor for airway protection -CT Head   Reported seizure, likely provoked due to hypoglycemia  -witnessed by family at home prior to presentation, no seizure activity with EMS -hypoglycemic with EMS  -ionized Ca WNL P -seizure precautions -PRN BZD if seizure repeat seizure   -check mag   EtOH use -acute intoxication P -CIWA  -B Vitamins    Best practice:  Diet: NPO Pain/Anxiety/Delirium protocol (if indicated): na VAP protocol (if indicated): na DVT prophylaxis: lovenox  GI prophylaxis:  na Glucose control: D50 gtt, q30 min SBG Mobility: BR Code Status: Full  Family Communication: pending 6/7 Disposition: ICU   Labs   CBC: Recent Labs  Lab 11/26/19 1600 11/28/19 1635 11/28/19 1659  WBC 4.2 4.3  --   NEUTROABS  --  2.6  --   HGB 14.1 10.8* 10.9*  HCT 41.2 32.4* 32.0*  MCV 90.7 95.3  --   PLT 349  215  --     Basic Metabolic Panel: Recent Labs  Lab 11/26/19 1600 11/28/19 1635 11/28/19 1659  NA 138 140 142  K 4.8 4.6 4.2  CL 101 111 108  CO2 26 18*  --   GLUCOSE 70 82 81  BUN 20 15 17   CREATININE 1.56* 1.39* 1.50*  CALCIUM 9.5 8.4*  --    GFR: Estimated Creatinine Clearance: 62.8 mL/min (A) (by C-G formula based on SCr of 1.5 mg/dL (H)). Recent Labs  Lab 11/26/19 1600 11/28/19 1635  WBC 4.2 4.3    Liver Function Tests: Recent Labs  Lab 11/26/19 1600 11/28/19 1635  AST 101* 71*  ALT 75* 47*  ALKPHOS 108 81  BILITOT 0.6 0.7  PROT 7.1 5.1*  ALBUMIN 3.4* 2.7*   Recent Labs  Lab 11/26/19 1600  LIPASE 31   No results for input(s): AMMONIA in the last 168 hours.  ABG    Component Value Date/Time   HCO3 29.6 (H) 08/04/2007 1146   TCO2 23 11/28/2019 1659     Coagulation Profile: No results for input(s): INR, PROTIME in the last 168 hours.  Cardiac Enzymes: No results for input(s): CKTOTAL, CKMB, CKMBINDEX, TROPONINI in the last 168 hours.  HbA1C: Hgb A1c MFr Bld  Date/Time Value Ref Range Status  07/22/2016 06:09 AM 5.4 4.8 - 5.6 % Final    Comment:    (NOTE)         Pre-diabetes: 5.7 - 6.4         Diabetes: >6.4         Glycemic control for adults with diabetes: <7.0     CBG: Recent Labs  Lab 11/28/19 1625 11/28/19 1646 11/28/19 1702 11/28/19 1734  GLUCAP 128* 86 41* 63*    Review of Systems:   Unable to obtain due to encephalopathy   Past Medical History  He,  has a past medical history of Coronary artery disease, MI, old, and Tick fever.   Surgical History    Past Surgical History:  Procedure Laterality Date  . HERNIA REPAIR    . KNEE SURGERY       Social History   reports that he has been smoking cigars. He has never used smokeless tobacco. He reports current alcohol use. He reports current drug use. Drug: Marijuana.   Family History   His family history is not on file.   Allergies No Known Allergies   Home  Medications  Prior to Admission medications   Medication Sig Start Date End Date Taking? Authorizing Provider  cariprazine 4.5 MG CAPS Take 1 capsule (4.5 mg total) by mouth daily. Patient not taking: Reported on 11/26/2019 08/06/19   Money, 08/08/19, FNP  lithium carbonate (ESKALITH) 450 MG CR tablet Take 1 tablet (450 mg total) by mouth at bedtime. Patient not taking: Reported on 11/26/2019 08/05/19   Money, 10/03/19, FNP  temazepam (RESTORIL) 15 MG capsule Take 1 capsule (15 mg total) by mouth at bedtime. Patient not taking: Reported on 11/26/2019 08/05/19   Money, 10/03/19, FNP     Critical  care time: 50 minutes      CRITICAL CARE Performed by: Lanier Clam   Total critical care time: 50 minutes  Critical care time was exclusive of separately billable procedures and treating other patients. Critical care was necessary to treat or prevent imminent or life-threatening deterioration.  Critical care was time spent personally by me on the following activities: development of treatment plan with patient and/or surrogate as well as nursing, discussions with consultants, evaluation of patient's response to treatment, examination of patient, obtaining history from patient or surrogate, ordering and performing treatments and interventions, ordering and review of laboratory studies, ordering and review of radiographic studies, pulse oximetry and re-evaluation of patient's condition.  Tessie Fass MSN, AGACNP-BC [place] [facility] [phone] If no answer, [phone] 11/28/2019, 6:30 PM

## 2019-11-28 NOTE — ED Notes (Signed)
Delay in patient's CT due to patient's continuous low blood sugars and hypotension. Both now controlled so CT just informed that pt can go to the scanner when they are ready

## 2019-11-28 NOTE — ED Notes (Signed)
Pt found to be hypoglycemic again, ED PA informed and pt given 3rd amp of D50. Increased D10 drip to 116ml/hr per verbal critical care order through ED PA

## 2019-11-28 NOTE — ED Triage Notes (Signed)
BIB EMS from home. Patient has witnessed seizure by family lasting several minutes. Also noted to be hypotensive upon EMS arrival - sugar read "low" Patient was combative upon arrival. Given 5mg  Versed IM, Glucagon, D10. No history of diabetes. History of heavy ETOH use. Patient arrives lethargic. VSS.

## 2019-11-28 NOTE — ED Notes (Signed)
Elink MD informed of bradycardia by this RN and ED PA. Verbal order to increase dextrose to 225. Will continue to monitor

## 2019-11-28 NOTE — ED Notes (Signed)
Biancia Dalton (Fiancee#[phone]) called to check on his status.

## 2019-11-28 NOTE — ED Notes (Signed)
Pt lethargic and falling asleep quickly, but still oriented and able to follow commands

## 2019-11-28 NOTE — ED Notes (Signed)
Pt's glucose 41, ER PA aware

## 2019-11-28 NOTE — ED Notes (Signed)
Pt had episode of sinus brady and HR went down to 42. EKG shot. Critical care paged by secretary

## 2019-11-28 NOTE — ED Notes (Signed)
Ronnette,sister, [phone] would like an update when available

## 2019-11-29 LAB — CBG MONITORING, ED
Glucose-Capillary: 250 mg/dL — ABNORMAL HIGH (ref 70–99)
Glucose-Capillary: 265 mg/dL — ABNORMAL HIGH (ref 70–99)
Glucose-Capillary: 267 mg/dL — ABNORMAL HIGH (ref 70–99)
Glucose-Capillary: 282 mg/dL — ABNORMAL HIGH (ref 70–99)
Glucose-Capillary: 301 mg/dL — ABNORMAL HIGH (ref 70–99)
Glucose-Capillary: 395 mg/dL — ABNORMAL HIGH (ref 70–99)

## 2019-11-29 LAB — BASIC METABOLIC PANEL
Anion gap: 9 (ref 5–15)
BUN: 10 mg/dL (ref 6–20)
CO2: 24 mmol/L (ref 22–32)
Calcium: 9.2 mg/dL (ref 8.9–10.3)
Chloride: 104 mmol/L (ref 98–111)
Creatinine, Ser: 1.34 mg/dL — ABNORMAL HIGH (ref 0.61–1.24)
GFR calc Af Amer: >60 mL/min (ref >60)
GFR calc non Af Amer: >60 mL/min (ref >60)
Glucose, Bld: 317 mg/dL — ABNORMAL HIGH (ref 70–99)
Potassium: 4.9 mmol/L (ref 3.5–5.1)
Sodium: 137 mmol/L (ref 135–145)

## 2019-11-29 LAB — CBC
HCT: 38.3 % — ABNORMAL LOW (ref 39.0–52.0)
Hemoglobin: 12.6 g/dL — ABNORMAL LOW (ref 13.0–17.0)
MCH: 30.9 pg (ref 26.0–34.0)
MCHC: 32.9 g/dL (ref 30.0–36.0)
MCV: 93.9 fL (ref 80.0–100.0)
Platelets: 213 10*3/uL (ref 150–400)
RBC: 4.08 MIL/uL — ABNORMAL LOW (ref 4.22–5.81)
RDW: 15.9 % — ABNORMAL HIGH (ref 11.5–15.5)
WBC: 4.7 10*3/uL (ref 4.0–10.5)
nRBC: 0 % (ref 0.0–0.2)

## 2019-11-29 LAB — C-PEPTIDE

## 2019-11-29 LAB — MAGNESIUM: Magnesium: 1.7 mg/dL (ref 1.7–2.4)

## 2019-11-29 LAB — MRSA PCR SCREENING: MRSA by PCR: NEGATIVE

## 2019-11-29 LAB — GLUCOSE, CAPILLARY
Glucose-Capillary: 361 mg/dL — ABNORMAL HIGH (ref 70–99)
Glucose-Capillary: 227 mg/dL — ABNORMAL HIGH (ref 70–99)
Glucose-Capillary: 278 mg/dL — ABNORMAL HIGH (ref 70–99)
Glucose-Capillary: 201 mg/dL — ABNORMAL HIGH (ref 70–99)
Glucose-Capillary: 185 mg/dL — ABNORMAL HIGH (ref 70–99)
Glucose-Capillary: 129 mg/dL — ABNORMAL HIGH (ref 70–99)
Glucose-Capillary: 135 mg/dL — ABNORMAL HIGH (ref 70–99)

## 2019-11-29 MED ORDER — ADULT MULTIVITAMIN W/MINERALS CH
1.0000 | ORAL_TABLET | Freq: Every day | ORAL | Status: DC
  Administered 2019-11-29 – 2019-11-30 (×2): 1 via ORAL
  Filled 2019-11-29 (×2): qty 1

## 2019-11-29 MED ORDER — FOLIC ACID 5 MG/ML IJ SOLN: 1.0000 mg | Freq: Every day | INTRAMUSCULAR | Status: DC

## 2019-11-29 MED ORDER — LITHIUM CARBONATE ER 450 MG PO TBCR
450.0000 mg | EXTENDED_RELEASE_TABLET | Freq: Every day | ORAL | Status: DC
  Administered 2019-11-29: 450 mg via ORAL
  Filled 2019-11-29 (×3): qty 1

## 2019-11-29 MED ORDER — SODIUM CHLORIDE 0.9 % IV SOLN: 1.0000 mg | Freq: Every day | INTRAVENOUS | Status: DC

## 2019-11-29 MED ORDER — CHLORHEXIDINE GLUCONATE CLOTH 2 % EX PADS
6.0000 | MEDICATED_PAD | Freq: Every day | CUTANEOUS | Status: DC
  Administered 2019-11-29 – 2019-11-30 (×2): 6 via TOPICAL

## 2019-11-29 MED ORDER — ONDANSETRON HCL 4 MG PO TABS: 4.0000 mg | ORAL_TABLET | Freq: Three times a day (TID) | ORAL | Status: DC | PRN

## 2019-11-29 MED ORDER — ACETAMINOPHEN 325 MG PO TABS: 650.0000 mg | ORAL_TABLET | Freq: Four times a day (QID) | ORAL | Status: DC | PRN

## 2019-11-29 MED ORDER — TRAZODONE HCL 50 MG PO TABS
50.0000 mg | ORAL_TABLET | Freq: Every day | ORAL | Status: DC
  Administered 2019-11-29: 50 mg via ORAL
  Filled 2019-11-29: qty 1

## 2019-11-29 NOTE — ED Notes (Signed)
+  ICU Breakfast Ordered 

## 2019-11-29 NOTE — Progress Notes (Signed)
eLink Physician-Brief Progress Note Patient Name: Dean Howard DOB: December 30, 1965 MRN: [id number]   Date of Service  11/29/2019  HPI/Events of Note  Hyperglycemia - Blood glucose = 395.   eICU Interventions  Will D/C D10W IV infusion.      Intervention Category Major Interventions: Hyperglycemia - active titration of insulin therapy  Lenell Antu 11/29/2019, 6:41 AM

## 2019-11-29 NOTE — Progress Notes (Signed)
eLink Physician-Brief Progress Note Patient Name: Dean Howard DOB: 1965-11-21 MRN: [id number]   Date of Service  11/29/2019  HPI/Events of Note  Blood glucose = 282.   eICU Interventions  Plan: 1. Decrease D10W IV infusion to 175 mL/hour.      Intervention Category Major Interventions: Hyperglycemia - active titration of insulin therapy  Dean Howard Eugene 11/29/2019, 1:34 AM

## 2019-11-29 NOTE — Progress Notes (Signed)
Assisted tele visit to patient with family member.  Esthefany Herrig P, RN  

## 2019-11-29 NOTE — Progress Notes (Signed)
eLink Physician-Brief Progress Note Patient Name: Dean Howard DOB: [birth year] MRN: [id number]   Date of Service  11/29/2019  HPI/Events of Note  Multiple issues: 1. Patient demands PO diet and 2. Blood glucose = 265.  eICU Interventions  Plan: 1. Advance diet to healthy heart.  2. Decrease D10W IV infusion to 75 mL/hour.      Intervention Category Major Interventions: Hyperglycemia - active titration of insulin therapy;Other:  Lenell Antu 11/29/2019, 3:58 AM

## 2019-11-29 NOTE — ED Notes (Signed)
Pt more alert and awake now. Glucose trending downwards after change in drip

## 2019-11-29 NOTE — Progress Notes (Signed)
NAME:  Dean Howard, MRN:  [id number], DOB:  07/05/1965, LOS: 1 ADMISSION DATE:  11/28/2019, CONSULTATION DATE:  11/28/19 REFERRING MD: Jeraldine Loots - EM  , CHIEF COMPLAINT:  AMS, hypoclycemia  Brief History   54 yo M with refractory hypoglycemia, possible intentional insulin overdose, who may have sustained seizure prior to EMS arrival.   Admitting to ICU   History of present illness   54 yo M PMH Bipolar 1, schizoaffective disorder, MDD with prior SI, who presents to ED 6/7 with AMS, reported seizure-like activity. Found by family on floor of kitchen, blue in color and visibly shaking -- family describes this as seizure-like activity. EMS was dispatched and found pt to be combative, CBG read as  "low," and was given 5mg  versed + glucagon.   Pt reports to EDP that he took his brother's insulin as suicide attempt, when asked further about this he did not respond and has been too encephalopathic for further SI evaluation at time of our exam. Family member denies missing insulin.   Past Medical History  MDD Schizoaffective disorder  Bipolar 1 disorder Suicidal ideation with attempt Drug-induced liver injury APAP toxicity, intentional self harm Secondary syphilis  Normocytic anemia Thrombocytopenia Alcohol abuse Tobacco abuse  Significant Hospital Events   6/7 Admitted to [facility] ICU   Consults:    Procedures:    Significant Diagnostic Tests:  6/7 CT H>   Micro Data:  6/7 SASR Cov2> neg  Antimicrobials:  na  Interim history/subjective:  Hypoglycemia resolved. Eating. Back to baseline.  He adamantly denies and SI leading to event. He states he has just been eating and sleeping poorly in days leading to event. He has not been able to take his medicine in over a month due to waiting on both medicaid and disability to kick in.  Objective   Blood pressure 116/80, pulse 63, temperature 98.4 F (36.9 C), temperature source Oral, resp. rate 13, height 6\' 3"  (1.905 m), weight  71.1 kg, SpO2 100 %.        Intake/Output Summary (Last 24 hours) at 11/29/2019 0738 Last data filed at 11/29/2019 0600 Gross per 24 hour  Intake 1000 ml  Output 500 ml  Net 500 ml   Filed Weights   11/28/19 1631 11/29/19 0600  Weight: 77.1 kg 71.1 kg    Examination: GEN: no acute distress HEENT: MMM, trachea midline  CV: RRR, ext warm PULM: Clear, no wheezing GI: Soft, +BS EXT: No edema or clubbing NEURO: Moves all 4 ext to command PSYCH: RASS 0, poor insight SKIN: No rashes  Lithium level undetectable   Resolved Hospital Problem list     Assessment & Plan:   Hypoglycemia, profound with seizures There is a report that he took his brother-in-law's insulin, however, the family states all insulin is accounted for and patient himself denies he said this. What would also make sense is lack of sleep and food leading to seizure and prolonged hypoglycemic effect - Psych consult, if they agree I think he is safe to go home - Biggest issue is going to be restarting his home meds (see below)  Schizophrenia/depression/bipolar- on lithium, trazadone, cariprazine at home.  Total cost of these per his report has been in thousands per month so he has not taken in over a month. - Restart home meds  EtOH use- 2-4 beers daily, stops without withdraw symptoms - Stop CIWA - MVN  Could potentially go home if (A) walks around unit, (B) PM sugars good and tolerating  PO and (C) able to find way to get him PTA meds  Best practice:  Diet: full Pain/Anxiety/Delirium protocol (if indicated): na VAP protocol (if indicated): na DVT prophylaxis: lovenox  GI prophylaxis: na Glucose control: watch CBG Mobility: walk around unit Code Status: Full  Family Communication: updated patient Disposition: ICU   Erskine Emery MD PCCM

## 2019-11-29 NOTE — Consult Note (Signed)
Telepsych Consultation   Reason for Consult:  Overdose Referring Physician:  Internal Medicine  Location of Patient: 2W12C Location of Provider: [facility]  Patient Identification: Dean Howard MRN:  [id number] Principal Diagnosis: <principal problem not specified> Diagnosis:  Active Problems:   Hypoglycemia   Total Time spent with patient: 15 minutes  Subjective:   Dean Howard is a 54 y.o. male was evaluated via teleassessment.  He is awake, alert and oriented x3.  Denies recent suicide attempt, and states he is not aware how the insulin got into his body. He denies any previous suicide attempts, self harm behaviors, or suicidal thoughts. He states he continues to see Charlotta Newton at [facility]. He reports he is non compliant with his medications, however his disability and social security have been approved. He reports his next appointment is on 06/21 @ [address] for psychiatric and medication management. Patient denies suicide attempt and appears to have been suffering from acute encephalitis when he endorsed suicide attempt using brothers insulin. This information is consistent with previous findings in chart review from 07/2019.   HPI: Per admission assessment note:  Pt reports to EDP that he took his brother's insulin as suicide attempt, when asked further about this he did not respond and has been too encephalopathic for further SI evaluation at time of our exam. Family member denies missing insulin.     Past Psychiatric History: Patient has a past history of mental illness with a diagnosis either of schizoaffective or bipolar disorder.  He has had several prior admissions but the most recent one [facility] just about [facility] [place].  At that time he was stabilized on a combination of Vraylar and lithium.  He also has a history of alcohol abuse but he tends to minimize it.  He says that in the past when he used to  drink a lot of liquor he had a tendency to become physically violent but that that no longer happens.  He claims that he is never had any previous suicide attempts.  Risk to Self:  No Risk to Others:   NO Prior Inpatient Therapy:  Yes [facility] 07/2019, [facility] Prior Outpatient Therapy:   Vesta Mixer   Past Medical History:  Past Medical History:  Diagnosis Date  . Coronary artery disease   . MI, old   . Tick fever     Past Surgical History:  Procedure Laterality Date  . HERNIA REPAIR    . KNEE SURGERY     Family History: No family history on file. Family Psychiatric  History:  Social History:  Social History   Substance and Sexual Activity  Alcohol Use Yes   Comment: Daily      Social History   Substance and Sexual Activity  Drug Use Yes  . Types: Marijuana   Comment: Daily     Social History   Socioeconomic History  . Marital status: Single    Spouse name: Not on file  . Number of children: Not on file  . Years of education: Not on file  . Highest education level: Not on file  Occupational History  . Not on file  Tobacco Use  . Smoking status: Current Every Day Smoker    Types: Cigars    Last attempt to quit: 03/04/2016    Years since quitting: 3.7  . Smokeless tobacco: Never Used  Substance and Sexual Activity  . Alcohol use: Yes    Comment: Daily   .  Drug use: Yes    Types: Marijuana    Comment: Daily   . Sexual activity: Yes  Other Topics Concern  . Not on file  Social History Narrative  . Not on file   Social Determinants of Health   Financial Resource Strain:   . Difficulty of Paying Living Expenses:   Food Insecurity:   . Worried About Programme researcher, broadcasting/film/videounning Out of Food in the Last Year:   . Baristaan Out of Food in the Last Year:   Transportation Needs:   . Freight forwarderLack of Transportation (Medical):   Marland Kitchen. Lack of Transportation (Non-Medical):   Physical Activity:   . Days of Exercise per Week:   . Minutes of Exercise per Session:   Stress:   . Feeling of Stress :   Social  Connections:   . Frequency of Communication with Friends and Family:   . Frequency of Social Gatherings with Friends and Family:   . Attends Religious Services:   . Active Member of Clubs or Organizations:   . Attends BankerClub or Organization Meetings:   Marland Kitchen. Marital Status:    Additional Social History:    Allergies:  No Known Allergies  Labs:  Results for orders placed or performed during the hospital encounter of 11/28/19 (from the past 48 hour(s))  CBG monitoring, ED     Status: Abnormal   Collection Time: 11/28/19  4:25 PM  Result Value Ref Range   Glucose-Capillary 128 (H) 70 - 99 mg/dL    Comment: Glucose reference range applies only to samples taken after fasting for at least 8 hours.  Comprehensive metabolic panel     Status: Abnormal   Collection Time: 11/28/19  4:35 PM  Result Value Ref Range   Sodium 140 135 - 145 mmol/L   Potassium 4.6 3.5 - 5.1 mmol/L   Chloride 111 98 - 111 mmol/L   CO2 18 (L) 22 - 32 mmol/L   Glucose, Bld 82 70 - 99 mg/dL    Comment: Glucose reference range applies only to samples taken after fasting for at least 8 hours.   BUN 15 6 - 20 mg/dL   Creatinine, Ser 1.611.39 (H) 0.61 - 1.24 mg/dL   Calcium 8.4 (L) 8.9 - 10.3 mg/dL   Total Protein 5.1 (L) 6.5 - 8.1 g/dL   Albumin 2.7 (L) 3.5 - 5.0 g/dL   AST 71 (H) 15 - 41 U/L   ALT 47 (H) 0 - 44 U/L   Alkaline Phosphatase 81 38 - 126 U/L   Total Bilirubin 0.7 0.3 - 1.2 mg/dL   GFR calc non Af Amer 58 (L) >60 mL/min   GFR calc Af Amer >60 >60 mL/min   Anion gap 11 5 - 15    Comment: Performed at [facility], [address] [place], [place] [id number]  CBC with Differential     Status: Abnormal   Collection Time: 11/28/19  4:35 PM  Result Value Ref Range   WBC 4.3 4.0 - 10.5 K/uL   RBC 3.40 (L) 4.22 - 5.81 MIL/uL   Hemoglobin 10.8 (L) 13.0 - 17.0 g/dL   HCT 54.032.4 (L) 98.139.0 - 19.152.0 %   MCV 95.3 80.0 - 100.0 fL   MCH 31.8 26.0 - 34.0 pg   MCHC 33.3 30.0 - 36.0 g/dL   RDW 47.815.8 (H) 29.511.5 - 62.115.5 %    Platelets 215 150 - 400 K/uL   nRBC 0.0 0.0 - 0.2 %   Neutrophils Relative % 61 %   Neutro  Abs 2.6 1.7 - 7.7 K/uL   Lymphocytes Relative 26 %   Lymphs Abs 1.1 0.7 - 4.0 K/uL   Monocytes Relative 10 %   Monocytes Absolute 0.4 0.1 - 1.0 K/uL   Eosinophils Relative 3 %   Eosinophils Absolute 0.1 0.0 - 0.5 K/uL   Basophils Relative 0 %   Basophils Absolute 0.0 0.0 - 0.1 K/uL   Immature Granulocytes 0 %   Abs Immature Granulocytes 0.00 0.00 - 0.07 K/uL    Comment: Performed at [facility], [address], [place], [place] [address]  Salicylate level     Status: Abnormal   Collection Time: 11/28/19  4:35 PM  Result Value Ref Range   Salicylate Lvl <7.0 (L) 7.0 - 30.0 mg/dL    Comment: Performed at [facility], [address] [place], [place] [address]  Acetaminophen level     Status: Abnormal   Collection Time: 11/28/19  4:35 PM  Result Value Ref Range   Acetaminophen (Tylenol), Serum <10 (L) 10 - 30 ug/mL    Comment: (NOTE) Therapeutic concentrations vary significantly. A range of 10-30 ug/mL  may be an effective concentration for many patients. However, some  are best treated at concentrations outside of this range. Acetaminophen concentrations >150 ug/mL at 4 hours after ingestion  and >50 ug/mL at 12 hours after ingestion are often associated with  toxic reactions. Performed at [facility], [address], [place], [place] [address]   Ethanol     Status: Abnormal   Collection Time: 11/28/19  4:35 PM  Result Value Ref Range   Alcohol, Ethyl (B) 11 (H) <10 mg/dL    Comment: (NOTE) Lowest detectable limit for serum alcohol is 10 mg/dL. For medical purposes only. Performed at [facility], [address], [place], [place] [address]   CBG monitoring, ED     Status: None   Collection Time: 11/28/19  4:46 PM  Result Value Ref Range   Glucose-Capillary 86 70 - 99 mg/dL    Comment: Glucose reference range applies only to samples taken after fasting for  at least 8 hours.  I-stat chem 8, ED (not at [facility] or [facility])     Status: Abnormal   Collection Time: 11/28/19  4:59 PM  Result Value Ref Range   Sodium 142 135 - 145 mmol/L   Potassium 4.2 3.5 - 5.1 mmol/L   Chloride 108 98 - 111 mmol/L   BUN 17 6 - 20 mg/dL   Creatinine, Ser 2.95 (H) 0.61 - 1.24 mg/dL   Glucose, Bld 81 70 - 99 mg/dL    Comment: Glucose reference range applies only to samples taken after fasting for at least 8 hours.   Calcium, Ion 1.15 1.15 - 1.40 mmol/L   TCO2 23 22 - 32 mmol/L   Hemoglobin 10.9 (L) 13.0 - 17.0 g/dL   HCT 62.1 (L) 30.8 - 65.7 %  CBG monitoring, ED     Status: Abnormal   Collection Time: 11/28/19  5:02 PM  Result Value Ref Range   Glucose-Capillary 41 (LL) 70 - 99 mg/dL    Comment: Glucose reference range applies only to samples taken after fasting for at least 8 hours.  CBG monitoring, ED     Status: Abnormal   Collection Time: 11/28/19  5:34 PM  Result Value Ref Range   Glucose-Capillary 63 (L) 70 - 99 mg/dL    Comment: Glucose reference range applies only to samples taken after fasting for  at least 8 hours.  SARS Coronavirus 2 by RT PCR (hospital order, performed in [facility] hospital lab) Nasopharyngeal Nasopharyngeal Swab     Status: None   Collection Time: 11/28/19  5:54 PM   Specimen: Nasopharyngeal Swab  Result Value Ref Range   SARS Coronavirus 2 NEGATIVE NEGATIVE    Comment: (NOTE) SARS-CoV-2 target nucleic acids are NOT DETECTED. The SARS-CoV-2 RNA is generally detectable in upper and lower respiratory specimens during the acute phase of infection. The lowest concentration of SARS-CoV-2 viral copies this assay can detect is 250 copies / mL. A negative result does not preclude SARS-CoV-2 infection and should not be used as the sole basis for treatment or other patient management decisions.  A negative result may occur with improper specimen collection / handling, submission of specimen other than nasopharyngeal swab, presence of viral  mutation(s) within the areas targeted by this assay, and inadequate number of viral copies (<250 copies / mL). A negative result must be combined with clinical observations, patient history, and epidemiological information. Fact Sheet for Patients:   BoilerBrush.com.cy Fact Sheet for Healthcare Providers: https://pope.com/ This test is not yet approved or cleared  by the [place] FDA and has been authorized for detection and/or diagnosis of SARS-CoV-2 by FDA under an Emergency Use Authorization (EUA).  This EUA will remain in effect (meaning this test can be used) for the duration of the COVID-19 declaration under Section 564(b)(1) of the Act, 21 U.S.C. section 360bbb-3(b)(1), unless the authorization is terminated or revoked sooner. Performed at [facility], [address], [place], [place] [address]   Lactic acid, plasma     Status: None   Collection Time: 11/28/19  6:07 PM  Result Value Ref Range   Lactic Acid, Venous 1.6 0.5 - 1.9 mmol/L    Comment: Performed at [facility], [address] [place], [place] [address]  Lithium level     Status: Abnormal   Collection Time: 11/28/19  6:10 PM  Result Value Ref Range   Lithium Lvl <0.06 (L) 0.60 - 1.20 mmol/L    Comment: Performed at [facility], [address], [place], [place] [address]  CK     Status: Abnormal   Collection Time: 11/28/19  6:30 PM  Result Value Ref Range   Total CK 424 (H) 49 - 397 U/L    Comment: Performed at [facility], [address] [place], [place] [address]  Magnesium     Status: None   Collection Time: 11/28/19  6:30 PM  Result Value Ref Range   Magnesium 1.8 1.7 - 2.4 mg/dL    Comment: Performed at [facility], [address] [place], [place] [address]  CBG monitoring, ED     Status: Abnormal   Collection Time: 11/28/19  6:35 PM  Result Value Ref Range   Glucose-Capillary 38 (LL) 70 - 99 mg/dL    Comment:  Glucose reference range applies only to samples taken after fasting for at least 8 hours.   Comment 1 Notify RN   CBG monitoring, ED     Status: Abnormal   Collection Time: 11/28/19  6:37 PM  Result Value Ref Range   Glucose-Capillary 47 (L) 70 - 99 mg/dL    Comment: Glucose reference range applies only to samples taken after fasting for at least 8 hours.  Urine rapid drug screen (hosp performed)     Status: Abnormal   Collection Time: 11/28/19  6:38 PM  Result Value Ref Range   Opiates NONE DETECTED NONE DETECTED   Cocaine NONE DETECTED NONE DETECTED   Benzodiazepines POSITIVE (A) NONE DETECTED   Amphetamines NONE DETECTED NONE DETECTED   Tetrahydrocannabinol POSITIVE (A) NONE DETECTED   Barbiturates NONE DETECTED NONE DETECTED    Comment: (NOTE) DRUG SCREEN FOR MEDICAL PURPOSES ONLY.  IF CONFIRMATION IS NEEDED FOR ANY PURPOSE, NOTIFY LAB WITHIN 5 DAYS. LOWEST DETECTABLE LIMITS FOR URINE DRUG SCREEN Drug Class                     Cutoff (ng/mL) Amphetamine and metabolites    1000 Barbiturate and metabolites    200 Benzodiazepine                 200 Tricyclics and metabolites     300 Opiates and metabolites        300 Cocaine and metabolites        300 THC                            50 Performed at [facility], [address] [place], [place] [address]   Urinalysis, Routine w reflex microscopic     Status: Abnormal   Collection Time: 11/28/19  6:38 PM  Result Value Ref Range   Color, Urine STRAW (A) YELLOW   APPearance CLEAR CLEAR   Specific Gravity, Urine 1.008 1.005 - 1.030   pH 6.0 5.0 - 8.0   Glucose, UA >=500 (A) NEGATIVE mg/dL   Hgb urine dipstick NEGATIVE NEGATIVE   Bilirubin Urine NEGATIVE NEGATIVE   Ketones, ur NEGATIVE NEGATIVE mg/dL   Protein, ur NEGATIVE NEGATIVE mg/dL   Nitrite NEGATIVE NEGATIVE   Leukocytes,Ua NEGATIVE NEGATIVE   WBC, UA 0-5 0 - 5 WBC/hpf   Bacteria, UA NONE SEEN NONE SEEN   Squamous Epithelial / LPF 0-5 0 - 5    Comment:  Performed at [facility], [address] [place], [place] [address]  CBG monitoring, ED     Status: Abnormal   Collection Time: 11/28/19  7:14 PM  Result Value Ref Range   Glucose-Capillary 108 (H) 70 - 99 mg/dL    Comment: Glucose reference range applies only to samples taken after fasting for at least 8 hours.  CBG monitoring, ED     Status: None   Collection Time: 11/28/19  8:06 PM  Result Value Ref Range   Glucose-Capillary 89 70 - 99 mg/dL    Comment: Glucose reference range applies only to samples taken after fasting for at least 8 hours.  CBG monitoring, ED     Status: Abnormal   Collection Time: 11/28/19  8:37 PM  Result Value Ref Range   Glucose-Capillary 41 (LL) 70 - 99 mg/dL    Comment: Glucose reference range applies only to samples taken after fasting for at least 8 hours.  Lactic acid, plasma     Status: None   Collection Time: 11/28/19  9:35 PM  Result Value Ref Range   Lactic Acid, Venous 1.5 0.5 - 1.9 mmol/L    Comment: Performed at [facility], [address], [place], [place] [address]  CBG monitoring, ED     Status: None   Collection Time: 11/28/19  9:36 PM  Result Value Ref Range   Glucose-Capillary 93 70 - 99 mg/dL    Comment: Glucose reference range applies only to samples taken after fasting for at least 8  hours.  CBG monitoring, ED     Status: Abnormal   Collection Time: 11/28/19 10:30 PM  Result Value Ref Range   Glucose-Capillary 152 (H) 70 - 99 mg/dL    Comment: Glucose reference range applies only to samples taken after fasting for at least 8 hours.  CBG monitoring, ED     Status: Abnormal   Collection Time: 11/28/19 11:31 PM  Result Value Ref Range   Glucose-Capillary 227 (H) 70 - 99 mg/dL    Comment: Glucose reference range applies only to samples taken after fasting for at least 8 hours.  CBG monitoring, ED     Status: Abnormal   Collection Time: 11/29/19 12:21 AM  Result Value Ref Range   Glucose-Capillary 267 (H) 70 - 99 mg/dL     Comment: Glucose reference range applies only to samples taken after fasting for at least 8 hours.   Comment 1 Document in Chart   CBG monitoring, ED     Status: Abnormal   Collection Time: 11/29/19  1:19 AM  Result Value Ref Range   Glucose-Capillary 282 (H) 70 - 99 mg/dL    Comment: Glucose reference range applies only to samples taken after fasting for at least 8 hours.  CBG monitoring, ED     Status: Abnormal   Collection Time: 11/29/19  1:58 AM  Result Value Ref Range   Glucose-Capillary 301 (H) 70 - 99 mg/dL    Comment: Glucose reference range applies only to samples taken after fasting for at least 8 hours.  CBG monitoring, ED     Status: Abnormal   Collection Time: 11/29/19  2:54 AM  Result Value Ref Range   Glucose-Capillary 250 (H) 70 - 99 mg/dL    Comment: Glucose reference range applies only to samples taken after fasting for at least 8 hours.  CBG monitoring, ED     Status: Abnormal   Collection Time: 11/29/19  3:49 AM  Result Value Ref Range   Glucose-Capillary 265 (H) 70 - 99 mg/dL    Comment: Glucose reference range applies only to samples taken after fasting for at least 8 hours.  CBG monitoring, ED     Status: Abnormal   Collection Time: 11/29/19  5:21 AM  Result Value Ref Range   Glucose-Capillary 395 (H) 70 - 99 mg/dL    Comment: Glucose reference range applies only to samples taken after fasting for at least 8 hours.  Glucose, capillary     Status: Abnormal   Collection Time: 11/29/19  6:11 AM  Result Value Ref Range   Glucose-Capillary 361 (H) 70 - 99 mg/dL    Comment: Glucose reference range applies only to samples taken after fasting for at least 8 hours.  MRSA PCR Screening     Status: None   Collection Time: 11/29/19  6:20 AM   Specimen: Nasopharyngeal  Result Value Ref Range   MRSA by PCR NEGATIVE NEGATIVE    Comment:        The GeneXpert MRSA Assay (FDA approved for NASAL specimens only), is one component of a comprehensive MRSA  colonization surveillance program. It is not intended to diagnose MRSA infection nor to guide or monitor treatment for MRSA infections. Performed at [facility], [place] [address], [place], [place] [address]   Basic metabolic panel     Status: Abnormal   Collection Time: 11/29/19  6:25 AM  Result Value Ref Range   Sodium 137 135 - 145 mmol/L   Potassium 4.9 3.5 - 5.1  mmol/L   Chloride 104 98 - 111 mmol/L   CO2 24 22 - 32 mmol/L   Glucose, Bld 317 (H) 70 - 99 mg/dL    Comment: Glucose reference range applies only to samples taken after fasting for at least 8 hours.   BUN 10 6 - 20 mg/dL   Creatinine, Ser 0.93 (H) 0.61 - 1.24 mg/dL   Calcium 9.2 8.9 - 81.8 mg/dL   GFR calc non Af Amer >60 >60 mL/min   GFR calc Af Amer >60 >60 mL/min   Anion gap 9 5 - 15    Comment: Performed at [facility], [address], [place], [place] [address]  CBC     Status: Abnormal   Collection Time: 11/29/19  6:25 AM  Result Value Ref Range   WBC 4.7 4.0 - 10.5 K/uL   RBC 4.08 (L) 4.22 - 5.81 MIL/uL   Hemoglobin 12.6 (L) 13.0 - 17.0 g/dL   HCT 16.9 (L) 67.8 - 93.8 %   MCV 93.9 80.0 - 100.0 fL   MCH 30.9 26.0 - 34.0 pg   MCHC 32.9 30.0 - 36.0 g/dL   RDW 10.1 (H) 75.1 - 02.5 %   Platelets 213 150 - 400 K/uL   nRBC 0.0 0.0 - 0.2 %    Comment: Performed at [facility], [address] [place] [address], [place], [place] [address]  Magnesium     Status: None   Collection Time: 11/29/19  6:25 AM  Result Value Ref Range   Magnesium 1.7 1.7 - 2.4 mg/dL    Comment: Performed at [facility], [address] [place], [place] [address]  Glucose, capillary     Status: Abnormal   Collection Time: 11/29/19  6:44 AM  Result Value Ref Range   Glucose-Capillary 278 (H) 70 - 99 mg/dL    Comment: Glucose reference range applies only to samples taken after fasting for at least 8 hours.  Glucose, capillary     Status: Abnormal   Collection Time: 11/29/19  7:20 AM  Result Value Ref Range   Glucose-Capillary  227 (H) 70 - 99 mg/dL    Comment: Glucose reference range applies only to samples taken after fasting for at least 8 hours.  Glucose, capillary     Status: Abnormal   Collection Time: 11/29/19 10:07 AM  Result Value Ref Range   Glucose-Capillary 201 (H) 70 - 99 mg/dL    Comment: Glucose reference range applies only to samples taken after fasting for at least 8 hours.    Medications:  Current Facility-Administered Medications  Medication Dose Route Frequency Provider Last Rate Last Admin  . acetaminophen (TYLENOL) tablet 650 mg  650 mg Oral Q6H PRN Lorin Glass, MD      . cariprazine (VRAYLAR) capsule 4.5 mg  4.5 mg Oral Daily Lorin Glass, MD      . Chlorhexidine Gluconate Cloth 2 % PADS 6 each  6 each Topical Daily Lorin Glass, MD      . docusate sodium (COLACE) capsule 100 mg  100 mg Oral BID PRN Lorin Glass, MD      . lithium carbonate (ESKALITH) CR tablet 450 mg  450 mg Oral QHS Lorin Glass, MD      . multivitamin with minerals tablet 1 tablet  1 tablet Oral Daily Lorin Glass, MD   1 tablet at 11/29/19 [phone]  . ondansetron (ZOFRAN) tablet 4 mg  4 mg Oral Q8H PRN Lorin Glass,  MD      . traZODone (DESYREL) tablet 50 mg  50 mg Oral QHS Lorin Glass, MD        Musculoskeletal:   Psychiatric Specialty Exam: Physical Exam  Psychiatric: He has a normal mood and affect. His behavior is normal.    Review of Systems  Psychiatric/Behavioral: Positive for agitation, sleep disturbance and suicidal ideas.  All other systems reviewed and are negative.   Blood pressure 120/73, pulse (!) 59, temperature 98.4 F (36.9 C), resp. rate (!) 21, height 6\' 3"  (1.905 m), weight 71.1 kg, SpO2 99 %.Body mass index is 19.59 kg/m.  General Appearance: Casual  Eye Contact:  Fair  Speech:  Clear and Coherent  Volume:  Normal  Mood:  Anxious and Depressed  Affect:  Depressed  Thought Process:  Coherent, Linear and Descriptions of Associations: Intact  Orientation:  Full  (Time, Place, and Person)  Thought Content:  Logical  Suicidal Thoughts:  No  Homicidal Thoughts:  No  Memory:  Immediate;   Fair Recent;   Fair  Judgement:  Fair  Insight:  Fair  Psychomotor Activity:  Normal  Concentration:  Concentration: Fair  Recall:  of Knowledge:  Fair  Language:  Fair  Akathisia:  No  Handed:  Right  AIMS (if indicated):     Assets:  Communication Skills Desire for Improvement Social Support  ADL's:  Intact  Cognition:  WNL  Sleep:        Disposition: No evidence of imminent risk to self or others at present.   Patient does not meet criteria for psychiatric inpatient admission. Supportive therapy provided about ongoing stressors. Discussed crisis plan, support from social network, calling 911, coming to the Emergency Department, and calling Suicide Hotline.  This service was provided via telemedicine using a 2-way, interactive audio and video technology.  Names of all persons participating in this telemedicine service and their role in this encounter. Name: Lord Lancour Role: patient   Name: Adah Perl Role: FNP-BC          Caryn Bee, FNP 11/29/2019 12:02 PM

## 2019-11-29 NOTE — ED Notes (Signed)
Pt threatening to leave if he does not get regular diet. Notified critical care of this, and rising CBG. Orders to slow D10 gtt, give regular food, and check CBG's q2h for now.

## 2019-11-29 NOTE — ED Notes (Signed)
Paged MD due to pt's most recent glucose readings now being in the 200s.

## 2019-11-29 NOTE — ED Notes (Signed)
Pt given [place] sandwich, cheese and soda.

## 2019-11-30 LAB — GLUCOSE, CAPILLARY
Glucose-Capillary: 100 mg/dL — ABNORMAL HIGH (ref 70–99)
Glucose-Capillary: 101 mg/dL — ABNORMAL HIGH (ref 70–99)
Glucose-Capillary: 105 mg/dL — ABNORMAL HIGH (ref 70–99)
Glucose-Capillary: 156 mg/dL — ABNORMAL HIGH (ref 70–99)
Glucose-Capillary: 38 mg/dL — CL (ref 70–99)

## 2019-11-30 MED ORDER — TRAZODONE HCL 50 MG PO TABS: 50.0000 mg | ORAL_TABLET | Freq: Every day | ORAL | Status: AC

## 2019-11-30 MED ORDER — CARIPRAZINE HCL 1.5 MG PO CAPS
4.5000 mg | ORAL_CAPSULE | Freq: Every day | ORAL | Status: DC
  Administered 2019-11-30: 4.5 mg via ORAL
  Filled 2019-11-30: qty 3

## 2019-11-30 NOTE — Progress Notes (Signed)
DISCHARGE NOTE HOME Dean Howard to be discharged Home per MD order. Discussed prescriptions and follow up appointments with the patient. Prescriptions given to patient; medication list explained in detail. Patient verbalized understanding.  Skin clean, dry and intact without evidence of skin break down, no evidence of skin tears noted. IV catheter discontinued intact. Site without signs and symptoms of complications. Dressing and pressure applied. Pt denies pain at the site currently. No complaints noted.  Patient free of lines, drains, and wounds.   An After Visit Summary (AVS) was printed and given to the patient. Patient escorted via wheelchair, and discharged home via public auto.  Katrina Stack, RN

## 2019-11-30 NOTE — Discharge Summary (Signed)
Physician Discharge Summary  Patient ID: AGUSTINE ROSSITTO MRN: [id number] DOB/AGE: 10/22/1965 54 y.o.  Admit date: 11/28/2019 Discharge date: 11/30/2019  Admission Diagnoses:  Discharge Diagnoses:  Active Problems:   Hypoglycemia   Discharged Condition: good  Hospital Course: Patient was admitted with severe hypoglycemia of unclear origin. With enough IV dextrose he eventually recovered.  There was a question of self-injecting insulin with intent of self-harm but no corroborating evidence of was found for this after discussion with patient and family.  He was seen by psychiatry who agreed patient did not display any suicidal ideation or obvious threat to self harm.  On further questioning it seems he had not eaten in multiple days due to GI symptoms which was best guess for his initial symptoms.    Regarding his schizophrenia, he has been out of meds for a number of months due to financial issues, he will re-establish care with his [place] who has multiple financial aid programs with limited copay.  Case management met with him to discuss this.  As soon as sugars recovered patient returned to baseline, tolerating diet, ambulating, mentating normally.  He is stable for discharge with outpatient psychiatry followup.  Consults: psychiatry, case management  Significant Diagnostic Studies: radiology: CT scan: head neg  Treatments: IV glucose, steroids, octreotide  Discharge Exam: Blood pressure 106/66, pulse 66, temperature 98 F (36.7 C), temperature source Oral, resp. rate 18, height '6\' 3"'  (1.905 m), weight 71 kg, SpO2 98 %. GEN: no acute distress HEENT: MMM, trachea midline  CV: RRR, ext warm PULM: Clear, no wheezing GI: Soft, +BS EXT: No edema or clubbing NEURO: Moves all 4 ext to command PSYCH: RASS 0, good insight SKIN: No rashes  Disposition: Discharge disposition: 01-Home or [place] with self care  Discharge Instructions    Call MD for:  extreme  fatigue   Complete by: As directed    Diet general   Complete by: As directed    Increase activity slowly   Complete by: As directed      Allergies as of 11/30/2019   No Known Allergies     Medication List    STOP taking these medications   temazepam 15 MG capsule Commonly known as: RESTORIL     TAKE these medications   Cariprazine HCl 4.5 MG Caps Take 1 capsule (4.5 mg total) by mouth daily.   lithium carbonate 450 MG CR tablet Commonly known as: ESKALITH Take 1 tablet (450 mg total) by mouth at bedtime.   traZODone 50 MG tablet Commonly known as: DESYREL Take 1 tablet (50 mg total) by mouth at bedtime.      32 minutes spent arranging hospital discharge  Signed: Candee Furbish 11/30/2019, 12:56 PM

## 2019-11-30 NOTE — Progress Notes (Addendum)
Patient claims to have money in the amount of $300.00 , checked chart but no valuables or money deposited. Called [facility] to check if he had left any valuables but there was none. Informed the patient that he was brought in via ambulance and to the ED, subsequently in [facility] and was transferred to [facility]. Nothing was endorsed to me about any valuables. Patient is for discharge today. Informed patient that if there were any valuables , security would have issued him  a copy and will be in the chart.Provided disposable gown for clothes as there was no personal clothing in cabinets

## 2019-11-30 NOTE — TOC Initial Note (Signed)
Transition of Care [facility]) - Initial/Assessment Note    Patient Details  Name: Dean Howard MRN: [id number] Date of Birth: 28-Feb-1966  [facility]) CM/SW Contact:    Bess Kinds, RN Phone Number: [phone] 11/30/2019, 10:29 AM  Clinical Narrative:                  Spoke with patient at the bedside to discuss transition plans. Patient will stay with his fiance at [address], [place], [address]. States that he has been approved for disability and his Medicaid is pending. He is followed by [facility] and gets prescription assistance through [facility] - verified in Epic that this is his pharmacy of choice. Patient agreeable to hospital follow up appointment at [facility] - appointment scheduled for 6/23 2:30pm. Patient requested transportation assistance at time of discharge. [place] transportation waiver signed and emailed to [email or web address]. TOC following for transition needs.   Expected Discharge Plan: Home/Self Care Barriers to Discharge: Continued Medical Work up   Patient Goals and CMS Choice Patient states their goals for this hospitalization and ongoing recovery are:: return home with fiance CMS Medicare.gov Compare Post Acute Care list provided to:: Patient Choice offered to / list presented to : NA  Expected Discharge Plan and Services Expected Discharge Plan: Home/Self Care In-house Referral: Financial Counselor, Clinical Social Work Discharge Planning Services: CM Consult Post Acute Care Choice: NA Living arrangements for the past 2 months: Single Family Home                 DME Arranged: NIV DME Agency: NA       HH Arranged: NA HH Agency: NA        Prior Living Arrangements/Services Living arrangements for the past 2 months: Single Family Home Lives with:: Self Patient language and need for interpreter reviewed:: Yes Do you feel safe going back to the place where you live?: Yes      Need for Family  Participation in Patient Care: Yes (Comment) Care giver support system in place?: Yes (comment)   Criminal Activity/Legal Involvement Pertinent to Current Situation/Hospitalization: No - Comment as needed  Activities of Daily Living Home Assistive Devices/Equipment: None ADL Screening (condition at time of admission) Patient's cognitive ability adequate to safely complete daily activities?: Yes Is the patient deaf or have difficulty hearing?: No Does the patient have difficulty seeing, even when wearing glasses/contacts?: No Does the patient have difficulty concentrating, remembering, or making decisions?: No Patient able to express need for assistance with ADLs?: Yes Does the patient have difficulty dressing or bathing?: No Independently performs ADLs?: Yes (appropriate for developmental age) Does the patient have difficulty walking or climbing stairs?: No Weakness of Legs: None Weakness of Arms/Hands: None  Permission Sought/Granted                  Emotional Assessment Appearance:: Appears stated age   Affect (typically observed): Accepting Orientation: : Oriented to Self, Oriented to  Time, Oriented to Place, Oriented to Situation Alcohol / Substance Use: Not Applicable Psych Involvement: Yes (comment)  Admission diagnosis:  Seizure (HCC) [R56.9] Hypoglycemia [E16.2] Suicide attempt by drug ingestion, initial encounter (HCC) [T50.902A] Overdose of insulin, intentional self-harm, initial encounter [facility]) [T38.3X2A] Patient Active Problem List   Diagnosis Date Noted  . Hypoglycemia 11/28/2019  . Major depressive disorder   . Seizure (HCC)   . Drug-induced injury of liver 08/04/2019  . Alcohol abuse 08/04/2019  . MDD (major depressive disorder), recurrent severe, without psychosis (  [place]) 08/03/2019  . Pancytopenia ([place]) 07/31/2019  . Normocytic anemia 07/31/2019  . LFT elevation   . Overdose by acetaminophen 07/26/2019  . Thrombocytopenia ([place])   . Acetaminophen  toxicity, intentional self-harm, initial encounter ([place])   . Suicidal behavior with attempted self-injury ([place])   . Schizo-affective schizophrenia, chronic condition with acute exacerbation ([place]) 07/21/2018  . Bipolar 1 disorder, mixed, moderate ([place])   . Adjustment disorder 07/20/2016  . MDD (major depressive disorder), recurrent, severe, with psychosis ([place]) 07/18/2016  . 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 CHEST PAIN, PLEURITIC 09/16/2007   PCP:  Patient, No Pcp Per Pharmacy:   [place], [place] [address] Phone: [phone] Fax: [phone]     Social Determinants of Health (SDOH) Interventions    Readmission Risk Interventions No flowsheet data found.

## 2019-11-30 NOTE — TOC Transition Note (Signed)
Transition of Care [facility]) - CM/SW Discharge Note   Patient Details  Name: Dean Howard MRN: [id number] Date of Birth: May 12, 1966  Transition of Care [facility]) CM/SW Contact:  Bess Kinds, RN Phone Number: [phone] 11/30/2019, 1:15 PM   Clinical Narrative:     Spoke with patient at the bedside to clarify medication assistance. Patient states that out of pocket costs for medications through [facility] was $1000, but through [facility] patient receives a discounted rate and once his Medicaid is approved, he will get the Medicaid copay. Patient stated that his disability has been approved but he still has paperwork to submit to Medicaid. [place] transportation to be arranged for discharge. No further TOC needs identified.   Final next level of care: Home/Self Care Barriers to Discharge: No Barriers Identified   Patient Goals and CMS Choice Patient states their goals for this hospitalization and ongoing recovery are:: return home with fiance CMS Medicare.gov Compare Post Acute Care list provided to:: Patient Choice offered to / list presented to : NA  Discharge Placement                       Discharge Plan and Services In-house Referral: Financial Counselor, Clinical Social Work Discharge Planning Services: CM Consult Post Acute Care Choice: NA          DME Arranged: NIV DME Agency: NA       HH Arranged: NA HH Agency: NA        Social Determinants of Health (SDOH) Interventions     Readmission Risk Interventions No flowsheet data found.

## 2019-11-30 NOTE — Plan of Care (Signed)
?  Problem: Coping: ?Goal: Level of anxiety will decrease ?Outcome: Progressing ?  ?Problem: Safety: ?Goal: Ability to remain free from injury will improve ?Outcome: Progressing ?  ?

## 2019-12-14 ENCOUNTER — Telehealth (INDEPENDENT_AMBULATORY_CARE_PROVIDER_SITE_OTHER): Payer: Medicare Other | Admitting: Primary Care

## 2019-12-14 DIAGNOSIS — E162 Hypoglycemia, unspecified: Secondary | ICD-10-CM | POA: Diagnosis not present

## 2019-12-14 DIAGNOSIS — Z09 Encounter for follow-up examination after completed treatment for conditions other than malignant neoplasm: Secondary | ICD-10-CM

## 2019-12-14 DIAGNOSIS — Z7689 Persons encountering health services in other specified circumstances: Secondary | ICD-10-CM

## 2019-12-14 DIAGNOSIS — F320 Major depressive disorder, single episode, mild: Secondary | ICD-10-CM

## 2019-12-14 NOTE — Progress Notes (Addendum)
Telephone Note  I connected with Dean Howard on 12/14/19 at  2:30 PM EDT by telephone and verified that I am speaking with the correct person using two identifiers.   I discussed the limitations, risks, security and privacy concerns of performing an evaluation and management service by telephone and the availability of in person appointments. I also discussed with the patient that there may be a patient responsible charge related to this service. The patient expressed understanding and agreed to proceed. Patient location home Gwinda Passe, NP was at [facility]    History of Present Illness:  Dean Howard 54 y.o.male presents for follow up from the hospital. Admit date to the hospital was 11/28/19, patient was discharged from the hospital on 11/30/19, patient was admitted for:  severe hypoglycemia . He is also having establishment of care.   Past Medical History:  Diagnosis Date  . Coronary artery disease   . MI, old   . Tick fever    Current Outpatient Medications on File Prior to Visit  Medication Sig Dispense Refill  . cariprazine 4.5 MG CAPS Take 1 capsule (4.5 mg total) by mouth daily. (Patient not taking: Reported on 11/26/2019) 30 capsule 1  . lithium carbonate (ESKALITH) 450 MG CR tablet Take 1 tablet (450 mg total) by mouth at bedtime. (Patient not taking: Reported on 11/28/2019) 30 tablet 1  . traZODone (DESYREL) 50 MG tablet Take 1 tablet (50 mg total) by mouth at bedtime.     No current facility-administered medications on file prior to visit.   Observations/Objective: Review of Systems  All other systems reviewed and are negative.  Assessment and Plan: Diagnoses and all orders for this visit:  Current mild episode of major depressive disorder, unspecified whether recurrent (HCC) schizophrenia, he has been out of meds for a number of months due to financial issues, he will re-establish care with his [place] pharmacy who has multiple financial  aid programs with limited co pay. Will need follow up for psychiatry care.   Hypoglycemia admitted with severe hypoglycemia of unclear origin. No warrant was found with family suspicions pf taking insulin to harm self.   Hospital discharge follow-up Recommended follow up with pshye and establish care with PCP no mention of labs repeat.  Encounter to establish care Gwinda Passe, NP-C will be your  (PCP) she is mastered prepared . She is skilled to diagnosed and treat illness. Also able to answer health concern as well as continuing care of varied medical conditions, not limited by cause, organ system, or diagnosis.     Follow Up Instructions:    I discussed the assessment and treatment plan with the patient. The patient was provided an opportunity to ask questions and all were answered. The patient agreed with the plan and demonstrated an understanding of the instructions.   The patient was advised to call back or seek an in-person evaluation if the symptoms worsen or if the condition fails to improve as anticipated.  I provided 18  minutes of non-face-to-face time during this encounter. Includes review of records labs and imaging   Grayce Sessions, NP

## 2020-01-05 ENCOUNTER — Other Ambulatory Visit (INDEPENDENT_AMBULATORY_CARE_PROVIDER_SITE_OTHER): Payer: Self-pay | Admitting: Primary Care

## 2020-01-05 NOTE — Telephone Encounter (Signed)
Medication Refill - Medication: traZODone (DESYREL) 50 MG tablet ,lithium carbonate (ESKALITH) 450 MG CR tablet ,vraylar Patient is requesting a callback once medications have been sent to pharmacy.)   Has the patient contacted their pharmacy? yes (Agent: If no, request that the patient contact the pharmacy for the refill.) (Agent: If yes, when and what did the pharmacy advise?)contact pcp  Preferred Pharmacy (with phone number or street name):  [facility] - [place], [place] - [address] Phone:  [phone]  Fax:  [phone]       Agent: Please be advised that RX refills may take up to 3 business days. We ask that you follow-up with your pharmacy.

## 2020-01-05 NOTE — Telephone Encounter (Signed)
Requested medication (s) are due for refill today: Yes  Requested medication (s) are on the active medication list: Yes  Last refill:  08/14/19 11/30/19, 08/16/19  Future visit scheduled: No  Notes to clinic:  Established care last OV, unable to refill per protocol due to last refilled by another provider and failed items in protocol

## 2020-01-11 NOTE — Telephone Encounter (Signed)
Pt calling to follow up on refill request for cariprazine 4.5 MG CAPS  lithium carbonate (ESKALITH) 450 MG CR tablet  traZODone (DESYREL) 50 MG   Pt states he has been out 2 weeks  [facility] [place], [place] - [address] [employer] Phone:  [phone]  Fax:  [phone]

## 2020-01-11 NOTE — Telephone Encounter (Signed)
Sent to Dr. Alvis Lemmings for direction

## 2020-01-12 NOTE — Telephone Encounter (Signed)
Can you please give this patient a call and give him directions to the walk in clinic at [facility] to have his meds refilled tomorrow.

## 2020-01-12 NOTE — Telephone Encounter (Signed)
Patient is calling again to check the status of him medication request.  He stated that he has been without the medication for two weeks.  Please advise and let the patient know when he will be able to get his medication.

## 2020-04-12 ENCOUNTER — Emergency Department (HOSPITAL_COMMUNITY)
Admission: EM | Admit: 2020-04-12 | Discharge: 2020-04-12 | Disposition: A | Discharge status: Home / Self Care | Payer: Medicare Other | Attending: Emergency Medicine | Admitting: Emergency Medicine

## 2020-04-12 ENCOUNTER — Emergency Department (HOSPITAL_COMMUNITY): Payer: Medicare Other

## 2020-04-12 ENCOUNTER — Encounter (HOSPITAL_COMMUNITY): Payer: Self-pay

## 2020-04-12 DIAGNOSIS — R109 Unspecified abdominal pain: Secondary | ICD-10-CM | POA: Diagnosis present

## 2020-04-12 DIAGNOSIS — R630 Anorexia: Secondary | ICD-10-CM | POA: Diagnosis not present

## 2020-04-12 DIAGNOSIS — R1114 Bilious vomiting: Secondary | ICD-10-CM | POA: Insufficient documentation

## 2020-04-12 DIAGNOSIS — Z79899 Other long term (current) drug therapy: Secondary | ICD-10-CM | POA: Insufficient documentation

## 2020-04-12 DIAGNOSIS — R1084 Generalized abdominal pain: Secondary | ICD-10-CM | POA: Insufficient documentation

## 2020-04-12 DIAGNOSIS — R0789 Other chest pain: Secondary | ICD-10-CM | POA: Insufficient documentation

## 2020-04-12 DIAGNOSIS — F17210 Nicotine dependence, cigarettes, uncomplicated: Secondary | ICD-10-CM | POA: Diagnosis not present

## 2020-04-12 DIAGNOSIS — I2510 Atherosclerotic heart disease of native coronary artery without angina pectoris: Secondary | ICD-10-CM | POA: Diagnosis not present

## 2020-04-12 HISTORY — DX: Alcohol abuse, uncomplicated: F10.10

## 2020-04-12 LAB — TROPONIN I (HIGH SENSITIVITY)
Troponin I (High Sensitivity): 3 ng/L (ref <18)
Troponin I (High Sensitivity): 4 ng/L (ref <18)

## 2020-04-12 LAB — ETHANOL: Alcohol, Ethyl (B): <10 mg/dL (ref <10)

## 2020-04-12 LAB — HEPATIC FUNCTION PANEL
ALT: 31 U/L (ref 0–44)
AST: 61 U/L — ABNORMAL HIGH (ref 15–41)
Albumin: 3.5 g/dL (ref 3.5–5.0)
Alkaline Phosphatase: 81 U/L (ref 38–126)
Bilirubin, Direct: 0.3 mg/dL — ABNORMAL HIGH (ref 0.0–0.2)
Indirect Bilirubin: 0.6 mg/dL (ref 0.3–0.9)
Total Bilirubin: 0.9 mg/dL (ref 0.3–1.2)
Total Protein: 6.6 g/dL (ref 6.5–8.1)

## 2020-04-12 LAB — BASIC METABOLIC PANEL
Anion gap: 11 (ref 5–15)
BUN: 11 mg/dL (ref 6–20)
CO2: 22 mmol/L (ref 22–32)
Calcium: 8.9 mg/dL (ref 8.9–10.3)
Chloride: 103 mmol/L (ref 98–111)
Creatinine, Ser: 1.43 mg/dL — ABNORMAL HIGH (ref 0.61–1.24)
GFR, Estimated: 59 mL/min — ABNORMAL LOW (ref >60)
Glucose, Bld: 78 mg/dL (ref 70–99)
Potassium: 4 mmol/L (ref 3.5–5.1)
Sodium: 136 mmol/L (ref 135–145)

## 2020-04-12 LAB — CBC
HCT: 33.9 % — ABNORMAL LOW (ref 39.0–52.0)
Hemoglobin: 11.4 g/dL — ABNORMAL LOW (ref 13.0–17.0)
MCH: 32.9 pg (ref 26.0–34.0)
MCHC: 33.6 g/dL (ref 30.0–36.0)
MCV: 97.7 fL (ref 80.0–100.0)
Platelets: 281 10*3/uL (ref 150–400)
RBC: 3.47 MIL/uL — ABNORMAL LOW (ref 4.22–5.81)
RDW: 15.3 % (ref 11.5–15.5)
WBC: 5.6 10*3/uL (ref 4.0–10.5)
nRBC: 0 % (ref 0.0–0.2)

## 2020-04-12 LAB — URINALYSIS, ROUTINE W REFLEX MICROSCOPIC
Bilirubin Urine: NEGATIVE
Glucose, UA: NEGATIVE mg/dL
Hgb urine dipstick: NEGATIVE
Ketones, ur: 5 mg/dL — AB
Leukocytes,Ua: NEGATIVE
Nitrite: NEGATIVE
Protein, ur: NEGATIVE mg/dL
Specific Gravity, Urine: 1.012 (ref 1.005–1.030)
pH: 6 (ref 5.0–8.0)

## 2020-04-12 LAB — LIPASE, BLOOD: Lipase: 29 U/L (ref 11–51)

## 2020-04-12 MED ORDER — SODIUM CHLORIDE 0.9 % IV BOLUS
1000.0000 mL | Freq: Once | INTRAVENOUS | Status: AC
  Administered 2020-04-12: 1000 mL via INTRAVENOUS

## 2020-04-12 MED ORDER — FAMOTIDINE 20 MG PO TABS: 20.0000 mg | ORAL_TABLET | Freq: Two times a day (BID) | ORAL | 0 refills | Status: DC

## 2020-04-12 MED ORDER — SUCRALFATE 1 G PO TABS: 1.0000 g | ORAL_TABLET | Freq: Three times a day (TID) | ORAL | 0 refills | Status: DC

## 2020-04-12 MED ORDER — IOHEXOL 300 MG/ML  SOLN
100.0000 mL | Freq: Once | INTRAMUSCULAR | Status: AC | PRN
  Administered 2020-04-12: 100 mL via INTRAVENOUS

## 2020-04-12 MED ORDER — FAMOTIDINE IN NACL 20-0.9 MG/50ML-% IV SOLN
20.0000 mg | Freq: Once | INTRAVENOUS | Status: AC
  Administered 2020-04-12: 20 mg via INTRAVENOUS
  Filled 2020-04-12: qty 50

## 2020-04-12 MED ORDER — PROMETHAZINE HCL 25 MG/ML IJ SOLN
25.0000 mg | Freq: Once | INTRAMUSCULAR | Status: AC
  Administered 2020-04-12: 25 mg via INTRAVENOUS
  Filled 2020-04-12: qty 1

## 2020-04-12 MED ORDER — SUCRALFATE 1 GM/10ML PO SUSP
1.0000 g | Freq: Once | ORAL | Status: AC
  Administered 2020-04-12: 1 g via ORAL
  Filled 2020-04-12: qty 10

## 2020-04-12 NOTE — ED Notes (Signed)
Patient transported to CT 

## 2020-04-12 NOTE — ED Notes (Signed)
Patient transported to X-ray 

## 2020-04-12 NOTE — Discharge Instructions (Addendum)
As discussed, your evaluation today has been largely reassuring.  But, it is important that you monitor your condition carefully, and do not hesitate to return to the ED if you develop new, or concerning changes in your condition.  Your pain, nausea and vomiting are likely due to alcohol use.  Otherwise, please follow-up with your physician for appropriate ongoing care.

## 2020-04-12 NOTE — ED Provider Notes (Signed)
[facility] Provider Note   CSN: [id number] Arrival date & time: 04/12/20  1615     History Chief Complaint  Patient presents with  . Chest Pain    Dean Howard is a 54 y.o. male.  HPI  Patient presents with 1 week of abdominal pain, nausea, vomiting, anorexia.  In addition, the patient complains of hematemesis, melena and hematuria. Patient does have some chest pain, though it seems as though this is related to his vomiting episodes.  When asked specifically about what hurts primarily, the patient responses abdomen. Pain is diffuse, severe, not improved with anything.  Patient has a history of alcohol abuse, smokes marijuana.  Patient received 324 mg of aspirin in route by EMS providers.  Per EMS the patient was awake and alert, sitting upright, complaining of severe pain on their arrival.  Past Medical History:  Diagnosis Date  . Alcohol abuse   . Coronary artery disease   . MI, old   . Tick fever     Patient Active Problem List   Diagnosis Date Noted  . Hypoglycemia 11/28/2019  . Major depressive disorder   . Seizure (HCC)   . Drug-induced injury of liver 08/04/2019  . Alcohol abuse 08/04/2019  . MDD (major depressive disorder), recurrent severe, without psychosis (HCC) 08/03/2019  . Pancytopenia (HCC) 07/31/2019  . Normocytic anemia 07/31/2019  . LFT elevation   . Overdose by acetaminophen 07/26/2019  . Thrombocytopenia (HCC)   . Acetaminophen toxicity, intentional self-harm, initial encounter (HCC)   . Suicidal behavior with attempted self-injury (HCC)   . Schizo-affective schizophrenia, chronic condition with acute exacerbation (HCC) 07/21/2018  . Bipolar 1 disorder, mixed, moderate (HCC)   . Adjustment disorder 07/20/2016  . MDD (major depressive disorder), recurrent, severe, with psychosis (HCC) 07/18/2016  . 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 SECONDARY SYPHILIS OF  SKIN OR MUCOUS MEMBRANES 09/16/2007  . CHEST PAIN, PLEURITIC 09/16/2007    Past Surgical History:  Procedure Laterality Date  . HERNIA REPAIR    . KNEE SURGERY         No family history on file.  Social History   Tobacco Use  . Smoking status: Current Every Day Smoker    Packs/day: 0.50    Types: Cigarettes    Last attempt to quit: 03/04/2016    Years since quitting: 4.1  . Smokeless tobacco: Never Used  Vaping Use  . Vaping Use: Never used  Substance Use Topics  . Alcohol use: Yes    Alcohol/week: 6.0 standard drinks    Types: 6 Cans of beer per week    Comment: 12 oz  . Drug use: Yes    Types: Marijuana    Comment: Daily     Home Medications Prior to Admission medications   Medication Sig Start Date End Date Taking? Authorizing Provider  cariprazine 4.5 MG CAPS Take 1 capsule (4.5 mg total) by mouth daily. 08/06/19  Yes Money, Gerlene Burdockravis B, FNP  lithium carbonate (ESKALITH) 450 MG CR tablet Take 1 tablet (450 mg total) by mouth at bedtime. 08/05/19  Yes Money, Gerlene Burdockravis B, FNP  traZODone (DESYREL) 50 MG tablet Take 1 tablet (50 mg total) by mouth at bedtime. 11/30/19  Yes Duayne CalHoffman, Paul W, NP    Allergies    Patient has no known allergies.  Review of Systems   Review of Systems  Constitutional:       Per HPI, otherwise negative  HENT:  Per HPI, otherwise negative  Respiratory:       Per HPI, otherwise negative  Cardiovascular:       Per HPI, otherwise negative  Gastrointestinal: Positive for abdominal pain, blood in stool, nausea and vomiting.  Endocrine:       Negative aside from HPI  Genitourinary:       Neg aside from HPI   Musculoskeletal:       Per HPI, otherwise negative  Skin: Negative.   Neurological: Negative for syncope.    Physical Exam Updated Vital Signs BP 110/73   Pulse 68   Temp 98.5 F (36.9 C) (Oral)   Resp (!) 29   Ht 6\' 3"  (1.905 m)   Wt 72.6 kg   SpO2 100%   BMI 20.00 kg/m   Physical Exam Vitals and nursing note reviewed.   Constitutional:      General: He is not in acute distress.    Appearance: He is well-developed.  HENT:     Head: Normocephalic and atraumatic.  Eyes:     Conjunctiva/sclera: Conjunctivae normal.  Cardiovascular:     Rate and Rhythm: Normal rate and regular rhythm.  Pulmonary:     Effort: Pulmonary effort is normal. No respiratory distress.     Breath sounds: No stridor. Decreased breath sounds present.  Abdominal:     General: There is no distension.     Tenderness: There is abdominal tenderness. There is guarding.  Skin:    General: Skin is warm and dry.  Neurological:     Mental Status: He is alert and oriented to person, place, and time.     ED Results / Procedures / Treatments   Labs (all labs ordered are listed, but only abnormal results are displayed) Labs Reviewed  BASIC METABOLIC PANEL - Abnormal; Notable for the following components:      Result Value   Creatinine, Ser 1.43 (*)    GFR, Estimated 59 (*)    All other components within normal limits  CBC - Abnormal; Notable for the following components:   RBC 3.47 (*)    Hemoglobin 11.4 (*)    HCT 33.9 (*)    All other components within normal limits  HEPATIC FUNCTION PANEL - Abnormal; Notable for the following components:   AST 61 (*)    Bilirubin, Direct 0.3 (*)    All other components within normal limits  URINALYSIS, ROUTINE W REFLEX MICROSCOPIC - Abnormal; Notable for the following components:   Color, Urine STRAW (*)    Ketones, ur 5 (*)    All other components within normal limits  ETHANOL  LIPASE, BLOOD  TROPONIN I (HIGH SENSITIVITY)  TROPONIN I (HIGH SENSITIVITY)    EKG EKG Interpretation  Date/Time:  Thursday April 12 2020 16:18:45 EDT Ventricular Rate:  69 PR Interval:  166 QRS Duration: 94 QT Interval:  436 QTC Calculation: 467 R Axis:   -55 Text Interpretation: Normal sinus rhythm Left anterior fascicular block Abnormal ECG Confirmed by [id number] [phone]) on 04/12/2020 4:57:04  PM    Radiology DG Chest 2 View  Result Date: 04/12/2020 CLINICAL DATA:  Chest pain, dyspnea EXAM: CHEST - 2 VIEW COMPARISON:  11/28/2019 FINDINGS: The heart size and mediastinal contours are within normal limits. Both lungs are clear. The visualized skeletal structures are unremarkable. IMPRESSION: No active cardiopulmonary disease. Electronically Signed   By: 01/28/2020 MD   On: 04/12/2020 17:06   CT Abdomen Pelvis W Contrast  Result Date: 04/12/2020 CLINICAL DATA:  Abdominal distension,  bloody stool. EXAM: CT ABDOMEN AND PELVIS WITH CONTRAST TECHNIQUE: Multidetector CT imaging of the abdomen and pelvis was performed using the standard protocol following bolus administration of intravenous contrast. CONTRAST:  OMNIPAQUE IOHEXOL 300 MG/ML  SOLN COMPARISON:  None. FINDINGS: Lower chest: No acute abnormality. Hepatobiliary: No focal liver abnormality is seen. No gallstones, gallbladder wall thickening, or biliary dilatation. Pancreas: Unremarkable. No pancreatic ductal dilatation or surrounding inflammatory changes. Spleen: Normal in size without focal abnormality. Adrenals/Urinary Tract: Adrenal glands are unremarkable. Kidneys are normal, without renal calculi, focal lesion, or hydronephrosis. Bladder is unremarkable. Stomach/Bowel: Stomach is within normal limits. Appendix appears normal. No evidence of bowel wall thickening, distention, or inflammatory changes. Vascular/Lymphatic: No significant vascular findings are present. No enlarged abdominal or pelvic lymph nodes. Reproductive: Prostate is unremarkable. Other: No abdominal wall hernia or abnormality. No abdominopelvic ascites. Musculoskeletal: No acute or significant osseous findings. IMPRESSION: No abnormality seen in the abdomen or pelvis. Electronically Signed   By: Lupita Raider M.D.   On: 04/12/2020 18:19    Procedures Procedures (including critical care time)  Medications Ordered in ED Medications  famotidine (PEPCID)  IVPB 20 mg premix (has no administration in time range)  sucralfate (CARAFATE) 1 GM/10ML suspension 1 g (has no administration in time range)  sodium chloride 0.9 % bolus 1,000 mL (0 mLs Intravenous Stopped 04/12/20 1744)  promethazine (PHENERGAN) injection 25 mg (25 mg Intravenous Given 04/12/20 1643)  iohexol (OMNIPAQUE) 300 MG/ML solution 100 mL (100 mLs Intravenous Contrast Given 04/12/20 1811)    ED Course  I have reviewed the triage vital signs and the nursing notes.  Pertinent labs & imaging results that were available during my care of the patient were reviewed by me and considered in my medical decision making (see chart for details).    MDM Rules/Calculators/A&P  Update: Patient notes that he feels somewhat better, has some mild chills, but has no active vomiting.  8:43 PM Patient in similar condition, hemodynamically unremarkable.  I reviewed the patient's x-ray, CT scan, new appreciable intra-abdominal or pelvic lesions, x-ray chest unremarkable.  On cardiac monitor patient has no evidence for arrhythmia, no obvious ischemic changes, heart rate has been in the 70s, 80s with sinus rhythm throughout, unremarkable. Pulse oximetry 100% room air reassuring, given the absence of abnormalities on x-ray, low suspicion for occult pneumonia. Given patient's improvement here with fluids, Pepcid, Carafate, his history of alcohol use, presentation with chest pain, nausea, vomiting, abdominal pain, some suspicion for alcohol related gastritis. Patient is on lithium, but has no obvious renal dysfunction, no confusion, low suspicion for lithium toxicity. Given his improvement, patient appropriate for discharge with outpatient follow-up. Final Clinical Impression(s) / ED Diagnoses Final diagnoses:  Atypical chest pain  Generalized abdominal pain  Bilious vomiting with nausea   MDM Number of Diagnoses or Management Options Atypical chest pain: new, needed workup Bilious vomiting with  nausea: new, needed workup Generalized abdominal pain: new, needed workup   Amount and/or Complexity of Data Reviewed Clinical lab tests: reviewed Tests in the radiology section of CPT: reviewed Tests in the medicine section of CPT: reviewed Decide to obtain previous medical records or to obtain history from someone other than the patient: yes Obtain history from someone other than the patient: yes Review and summarize past medical records: yes Discuss the patient with other providers: yes Independent visualization of images, tracings, or specimens: yes  Risk of Complications, Morbidity, and/or Mortality Presenting problems: high Diagnostic procedures: high Management options: high  Critical  Care Total time providing critical care: < 30 minutes  Patient Progress Patient progress: stable  Rx / DC Orders ED Discharge Orders         Ordered    sucralfate (CARAFATE) 1 g tablet  3 times daily with meals & bedtime       Note to Pharmacy: Take for one week   04/12/20 2049    famotidine (PEPCID) 20 MG tablet  2 times daily        04/12/20 2049           Gerhard Munch, MD 04/12/20 2050

## 2020-04-12 NOTE — ED Triage Notes (Signed)
Pt arrived via [facility] from home for 7/10 left sided chest pain that started this morning. Per EMS pt also stated he has been vomiting "dark stringy" blood and dark red blood in stoolx4 days. Pt c/o generalized abdominal pain and SOB. Pt c/o N/Vx2 mos. Up to 4-5 times a day. Pt told EMS he drinks 6 beers a day. EMS gave ASA 324 mg. Pt is A&Ox4.

## 2020-06-01 ENCOUNTER — Emergency Department (HOSPITAL_COMMUNITY)
Admission: EM | Admit: 2020-06-01 | Discharge: 2020-06-01 | Discharge status: Against Medical Advice | Payer: Medicare Other

## 2020-06-01 ENCOUNTER — Other Ambulatory Visit: Payer: Self-pay

## 2020-06-02 ENCOUNTER — Ambulatory Visit (HOSPITAL_COMMUNITY)
Admission: EM | Admit: 2020-06-02 | Discharge: 2020-06-02 | Disposition: A | Discharge status: Home / Self Care | Payer: Medicare Other

## 2020-06-02 ENCOUNTER — Other Ambulatory Visit: Payer: Self-pay

## 2020-06-02 ENCOUNTER — Emergency Department (HOSPITAL_COMMUNITY)
Admission: EM | Admit: 2020-06-02 | Discharge: 2020-06-03 | Disposition: A | Discharge status: Home / Self Care | Payer: Medicare Other | Attending: Emergency Medicine | Admitting: Emergency Medicine

## 2020-06-02 ENCOUNTER — Ambulatory Visit (HOSPITAL_COMMUNITY): Payer: Self-pay

## 2020-06-02 DIAGNOSIS — K625 Hemorrhage of anus and rectum: Secondary | ICD-10-CM | POA: Diagnosis present

## 2020-06-02 DIAGNOSIS — Z5321 Procedure and treatment not carried out due to patient leaving prior to being seen by health care provider: Secondary | ICD-10-CM | POA: Insufficient documentation

## 2020-06-02 LAB — CBC
HCT: 39.2 % (ref 39.0–52.0)
Hemoglobin: 13 g/dL (ref 13.0–17.0)
MCH: 32 pg (ref 26.0–34.0)
MCHC: 33.2 g/dL (ref 30.0–36.0)
MCV: 96.6 fL (ref 80.0–100.0)
Platelets: 245 10*3/uL (ref 150–400)
RBC: 4.06 MIL/uL — ABNORMAL LOW (ref 4.22–5.81)
RDW: 13.4 % (ref 11.5–15.5)
WBC: 5.7 10*3/uL (ref 4.0–10.5)
nRBC: 0 % (ref 0.0–0.2)

## 2020-06-02 LAB — COMPREHENSIVE METABOLIC PANEL
ALT: 39 U/L (ref 0–44)
AST: 53 U/L — ABNORMAL HIGH (ref 15–41)
Albumin: 3.8 g/dL (ref 3.5–5.0)
Alkaline Phosphatase: 77 U/L (ref 38–126)
Anion gap: 11 (ref 5–15)
BUN: 13 mg/dL (ref 6–20)
CO2: 22 mmol/L (ref 22–32)
Calcium: 9.5 mg/dL (ref 8.9–10.3)
Chloride: 102 mmol/L (ref 98–111)
Creatinine, Ser: 1.52 mg/dL — ABNORMAL HIGH (ref 0.61–1.24)
GFR, Estimated: 54 mL/min — ABNORMAL LOW (ref >60)
Glucose, Bld: 74 mg/dL (ref 70–99)
Potassium: 4.6 mmol/L (ref 3.5–5.1)
Sodium: 135 mmol/L (ref 135–145)
Total Bilirubin: 0.4 mg/dL (ref 0.3–1.2)
Total Protein: 6.6 g/dL (ref 6.5–8.1)

## 2020-06-02 LAB — TYPE AND SCREEN
ABO/RH(D): O POS
Antibody Screen: NEGATIVE

## 2020-06-02 NOTE — ED Notes (Signed)
Patient is being discharged from the Urgent Care and sent to the Emergency Department via private vehicle . Per [facility], np, patient is in need of higher level of care due to complaint. Patient is aware and verbalizes understanding of plan of care. There were no vitals filed for this visit.

## 2020-06-02 NOTE — ED Triage Notes (Signed)
Pt reports dark red blood per rectum x 4 days. Denies abdominal pain, dizziness, or weakness.

## 2020-06-02 NOTE — ED Notes (Signed)
Pt called x 3 with no answer

## 2020-07-12 ENCOUNTER — Ambulatory Visit: Payer: Self-pay | Admitting: Nurse Practitioner

## 2020-07-16 ENCOUNTER — Ambulatory Visit: Payer: Self-pay | Admitting: Family Medicine

## 2020-09-04 ENCOUNTER — Ambulatory Visit (INDEPENDENT_AMBULATORY_CARE_PROVIDER_SITE_OTHER): Payer: Medicare Other | Admitting: Primary Care

## 2020-09-04 ENCOUNTER — Encounter (INDEPENDENT_AMBULATORY_CARE_PROVIDER_SITE_OTHER): Payer: Self-pay | Admitting: Primary Care

## 2020-09-04 ENCOUNTER — Other Ambulatory Visit: Payer: Self-pay

## 2020-09-04 VITALS — BP 105/71 | HR 65 | Temp 97.2°F | Ht 75.0 in | Wt 168.0 lb

## 2020-09-04 DIAGNOSIS — Z1211 Encounter for screening for malignant neoplasm of colon: Secondary | ICD-10-CM

## 2020-09-04 DIAGNOSIS — R079 Chest pain, unspecified: Secondary | ICD-10-CM

## 2020-09-04 DIAGNOSIS — F1010 Alcohol abuse, uncomplicated: Secondary | ICD-10-CM | POA: Diagnosis not present

## 2020-09-04 DIAGNOSIS — Z23 Encounter for immunization: Secondary | ICD-10-CM

## 2020-09-04 DIAGNOSIS — Z79899 Other long term (current) drug therapy: Secondary | ICD-10-CM | POA: Diagnosis not present

## 2020-09-04 NOTE — Progress Notes (Signed)
Established Patient Office Visit  Subjective:  Patient ID: Dean Howard, male    DOB: 12-05-1965  Age: 55 y.o. MRN: [id number]  CC:  Chief Complaint  Patient presents with  . New Patient (Initial Visit)    HPI [name] is a 55 year old male (previously seenJune on 23rd 2021 ) presents for an acute visit with concerns of chest pain with radiate down left arm.  States it feels like his first heart attack.  He has presented to the urgent care and emergency room with chest pain.Yesterday he had a headache with dizziness  and fell down but did not tell any one. Denies injuries . Denies chest pain today. Intermittent for a month no identifiable factors for onset .  Past Medical History:  Diagnosis Date  . Alcohol abuse   . Coronary artery disease   . MI, old   . Tick fever     Past Surgical History:  Procedure Laterality Date  . HERNIA REPAIR    . KNEE SURGERY      No family history on file.  Social History   Socioeconomic History  . Marital status: Married    Spouse name: Not on file  . Number of children: Not on file  . Years of education: Not on file  . Highest education level: Not on file  Occupational History  . Not on file  Tobacco Use  . Smoking status: Current Every Day Smoker    Packs/day: 0.50    Types: Cigarettes    Last attempt to quit: 03/04/2016    Years since quitting: 4.5  . Smokeless tobacco: Never Used  Vaping Use  . Vaping Use: Never used  Substance and Sexual Activity  . Alcohol use: Yes    Alcohol/week: 6.0 standard drinks    Types: 6 Cans of beer per week    Comment: 12 oz  . Drug use: Yes    Types: Marijuana    Comment: Daily   . Sexual activity: Yes  Other Topics Concern  . Not on file  Social History Narrative  . Not on file   Social Determinants of Health   Financial Resource Strain: Not on file  Food Insecurity: Not on file  Transportation Needs: Not on file  Physical Activity: Not on file  Stress: Not on  file  Social Connections: Not on file  Intimate Partner Violence: Not on file    Outpatient Medications Prior to Visit  Medication Sig Dispense Refill  . lithium carbonate (ESKALITH) 450 MG CR tablet Take 1 tablet (450 mg total) by mouth at bedtime. 30 tablet 1  . traZODone (DESYREL) 50 MG tablet Take 1 tablet (50 mg total) by mouth at bedtime.    . cariprazine 4.5 MG CAPS Take 1 capsule (4.5 mg total) by mouth daily. 30 capsule 1  . famotidine (PEPCID) 20 MG tablet Take 1 tablet (20 mg total) by mouth 2 (two) times daily. 30 tablet 0  . sucralfate (CARAFATE) 1 g tablet Take 1 tablet (1 g total) by mouth 4 (four) times daily -  with meals and at bedtime. 21 tablet 0   No facility-administered medications prior to visit.    No Known Allergies  ROS Review of Systems  Constitutional: Positive for fatigue.  HENT: Positive for postnasal drip.   Respiratory: Positive for shortness of breath.   Cardiovascular: Positive for chest pain.  Neurological: Positive for headaches.  All other systems reviewed and are negative.     Objective:  Physical Exam Vitals reviewed.  Constitutional:      Appearance: Normal appearance.  HENT:     Head: Normocephalic.     Right Ear: External ear normal.     Left Ear: External ear normal.     Nose: Nose normal.  Eyes:     Extraocular Movements: Extraocular movements intact.  Cardiovascular:     Rate and Rhythm: Normal rate and regular rhythm.  Pulmonary:     Effort: Pulmonary effort is normal.     Breath sounds: Normal breath sounds.  Abdominal:     General: Abdomen is flat. Bowel sounds are normal.     Palpations: Abdomen is soft.  Musculoskeletal:        General: Normal range of motion.     Cervical back: Normal range of motion and neck supple.  Skin:    General: Skin is warm and dry.  Neurological:     Mental Status: He is alert and oriented to person, place, and time.  Psychiatric:        Mood and Affect: Mood normal.         Behavior: Behavior normal.        Thought Content: Thought content normal.        Judgment: Judgment normal.     BP 105/71 (BP Location: Right Arm, Patient Position: Sitting, Cuff Size: Normal)   Pulse 65   Temp (!) 97.2 F (36.2 C) (Temporal)   Ht _0  (1.905 m)   Wt 168 lb (76.2 kg)   BMI 21.00 kg/m  Wt Readings from Last 3 Encounters:  09/04/20 168 lb (76.2 kg)  06/02/20 170 lb (77.1 kg)  04/12/20 160 lb (72.6 kg)     Health Maintenance Due  Topic Date Due  . COVID-19 Vaccine (1) Never done  . TETANUS/TDAP  Never done  . COLONOSCOPY (Pts 45-82yr Insurance coverage will need to be confirmed)  Never done  . INFLUENZA VACCINE  01/22/2020    There are no preventive care reminders to display for this patient.  Lab Results  Component Value Date   TSH 3.879 07/22/2016   Lab Results  Component Value Date   WBC 5.7 06/02/2020   HGB 13.0 06/02/2020   HCT 39.2 06/02/2020   MCV 96.6 06/02/2020   PLT 245 06/02/2020   Lab Results  Component Value Date   NA 135 06/02/2020   K 4.6 06/02/2020   CO2 22 06/02/2020   GLUCOSE 74 06/02/2020   BUN 13 06/02/2020   CREATININE 1.52 (H) 06/02/2020   BILITOT 0.4 06/02/2020   ALKPHOS 77 06/02/2020   AST 53 (H) 06/02/2020   ALT 39 06/02/2020   PROT 6.6 06/02/2020   ALBUMIN 3.8 06/02/2020   CALCIUM 9.5 06/02/2020   ANIONGAP 11 06/02/2020   Lab Results  Component Value Date   CHOL 146 07/22/2016   Lab Results  Component Value Date   HDL 46 07/22/2016   Lab Results  Component Value Date   LDLCALC 81 07/22/2016   Lab Results  Component Value Date   TRIG 95 07/22/2016   Lab Results  Component Value Date   CHOLHDL 3.2 07/22/2016   Lab Results  Component Value Date   HGBA1C 5.4 07/22/2016      Assessment & Plan:  RCallumwas seen today for new patient (initial visit).  Diagnoses and all orders for this visit: RMerlonwas seen today for new patient (initial visit).  Diagnoses and all orders for this  visit:  Need for Tdap  vaccination -     Tdap vaccine greater than or equal to 7yo IM  Chest pain, unspecified type  Normal sinus rhythm Left anterior fascicular block Abnormal ECG  -     Ambulatory referral to Cardiology  Colon cancer screening -     Ambulatory referral to Gastroenterology  High risk medication use -     CBC with Differential -     CMP14+EGFR  Alcohol abuse Drinks 4-6 beers daily and is not interested in stopping   No orders of the defined types were placed in this encounter.   Follow-up: No follow-ups on file.    Kerin Perna, NP

## 2020-09-04 NOTE — Progress Notes (Signed)
Pt states he has been experiencing sharp pain on left side radiating from chest down left arm. Feels like it did when he had his first heart attack

## 2020-09-04 NOTE — Patient Instructions (Signed)
Tdap (Tetanus, Diphtheria, Pertussis) Vaccine: What You Need to Know 1. Why get vaccinated? Tdap vaccine can prevent tetanus, diphtheria, and pertussis. Diphtheria and pertussis spread from person to person. Tetanus enters the body through cuts or wounds.  TETANUS (T) causes painful stiffening of the muscles. Tetanus can lead to serious health problems, including being unable to open the mouth, having trouble swallowing and breathing, or death.  DIPHTHERIA (D) can lead to difficulty breathing, heart failure, paralysis, or death.  PERTUSSIS (aP), also known as "whooping cough," can cause uncontrollable, violent coughing that makes it hard to breathe, eat, or drink. Pertussis can be extremely serious especially in babies and young children, causing pneumonia, convulsions, brain damage, or death. In teens and adults, it can cause weight loss, loss of bladder control, passing out, and rib fractures from severe coughing. 2. Tdap vaccine Tdap is only for children 7 years and older, adolescents, and adults.  Adolescents should receive a single dose of Tdap, preferably at age 11 or 12 years. Pregnant people should get a dose of Tdap during every pregnancy, preferably during the early part of the third trimester, to help protect the newborn from pertussis. Infants are most at risk for severe, life-threatening complications from pertussis. Adults who have never received Tdap should get a dose of Tdap. Also, adults should receive a booster dose of either Tdap or Td (a different vaccine that protects against tetanus and diphtheria but not pertussis) every 10 years, or after 5 years in the case of a severe or dirty wound or burn. Tdap may be given at the same time as other vaccines. 3. Talk with your health care provider Tell your vaccine provider if the person getting the vaccine:  Has had an allergic reaction after a previous dose of any vaccine that protects against tetanus, diphtheria, or pertussis, or  has any severe, life-threatening allergies  Has had a coma, decreased level of consciousness, or prolonged seizures within 7 days after a previous dose of any pertussis vaccine (DTP, DTaP, or Tdap)  Has seizures or another nervous system problem  Has ever had Guillain-Barr Syndrome (also called "GBS")  Has had severe pain or swelling after a previous dose of any vaccine that protects against tetanus or diphtheria In some cases, your health care provider may decide to postpone Tdap vaccination until a future visit. People with minor illnesses, such as a cold, may be vaccinated. People who are moderately or severely ill should usually wait until they recover before getting Tdap vaccine.  Your health care provider can give you more information. 4. Risks of a vaccine reaction  Pain, redness, or swelling where the shot was given, mild fever, headache, feeling tired, and nausea, vomiting, diarrhea, or stomachache sometimes happen after Tdap vaccination. People sometimes faint after medical procedures, including vaccination. Tell your provider if you feel dizzy or have vision changes or ringing in the ears.  As with any medicine, there is a very remote chance of a vaccine causing a severe allergic reaction, other serious injury, or death. 5. What if there is a serious problem? An allergic reaction could occur after the vaccinated person leaves the clinic. If you see signs of a severe allergic reaction (hives, swelling of the face and throat, difficulty breathing, a fast heartbeat, dizziness, or weakness), call 9-1-1 and get the person to the nearest hospital. For other signs that concern you, call your health care provider.  Adverse reactions should be reported to the Vaccine Adverse Event Reporting System (VAERS). Your health   care provider will usually file this report, or you can do it yourself. Visit the VAERS website at www.vaers.LAgents.no or call (417)820-6395. VAERS is only for reporting  reactions, and VAERS staff members do not give medical advice. 6. The National Vaccine Injury Compensation Program The [employer] Vaccine Injury Compensation Program (VICP) is a federal program that was created to compensate people who may have been injured by certain vaccines. Claims regarding alleged injury or death due to vaccination have a time limit for filing, which may be as short as two years. Visit the VICP website at SpiritualWord.at or call 2792213172 to learn about the program and about filing a claim. 7. How can I learn more?  Ask your health care provider.  Call your local or state health department.  Visit the website of the Food and Drug Administration (FDA) for vaccine package inserts and additional information at FinderList.no.  Contact the Centers for Disease Control and Prevention (CDC): ? Call 617-737-8384 (1-800-CDC-INFO) or ? Visit CDC's website at PicCapture.uy. Vaccine Information Statement Tdap (Tetanus, Diphtheria, Pertussis) Vaccine (01/27/2020) This information is not intended to replace advice given to you by your health care provider. Make sure you discuss any questions you have with your health care provider. Document Revised: 02/22/2020 Document Reviewed: 02/22/2020 Elsevier Patient Education  2021 Elsevier Inc. Influenza, Adult Influenza is also called "the flu." It is an infection in the lungs, nose, and throat (respiratory tract). It spreads easily from person to person (is contagious). The flu causes symptoms that are like a cold, along with high fever and body aches. What are the causes? This condition is caused by the influenza virus. You can get the virus by:  Breathing in droplets that are in the air after a person infected with the flu coughed or sneezed.  Touching something that has the virus on it and then touching your mouth, nose, or eyes. What increases the risk? Certain things may  make you more likely to get the flu. These include:  Not washing your hands often.  Having close contact with many people during cold and flu season.  Touching your mouth, eyes, or nose without first washing your hands.  Not getting a flu shot every year. You may have a higher risk for the flu, and serious problems, such as a lung infection (pneumonia), if you:  Are older than 65.  Are pregnant.  Have a weakened disease-fighting system (immune system) because of a disease or because you are taking certain medicines.  Have a long-term (chronic) condition, such as: ? Heart, kidney, or lung disease. ? Diabetes. ? Asthma.  Have a liver disorder.  Are very overweight (morbidly obese).  Have anemia. What are the signs or symptoms? Symptoms usually begin suddenly and last 4-14 days. They may include:  Fever and chills.  Headaches, body aches, or muscle aches.  Sore throat.  Cough.  Runny or stuffy (congested) nose.  Feeling discomfort in your chest.  Not wanting to eat as much as normal.  Feeling weak or tired.  Feeling dizzy.  Feeling sick to your stomach or throwing up. How is this treated? If the flu is found early, you can be treated with antiviral medicine. This can help to reduce how bad the illness is and how long it lasts. This may be given by mouth or through an IV tube. Taking care of yourself at home can help your symptoms get better. Your doctor may want you to:  Take over-the-counter medicines.  Drink plenty of fluids. The  flu often goes away on its own. If you have very bad symptoms or other problems, you may be treated in a hospital. Follow these instructions at home: Activity  Rest as needed. Get plenty of sleep.  Stay home from work or school as told by your doctor. ? Do not leave home until you do not have a fever for 24 hours without taking medicine. ? Leave home only to go to your doctor. Eating and drinking  Take an ORS (oral  rehydration solution). This is a drink that is sold at pharmacies and stores.  Drink enough fluid to keep your pee pale yellow.  Drink clear fluids in small amounts as you are able. Clear fluids include: ? Water. ? Ice chips. ? Fruit juice mixed with water. ? Low-calorie sports drinks.  Eat bland foods that are easy to digest. Eat small amounts as you are able. These foods include: ? Bananas. ? Applesauce. ? Rice. ? Lean meats. ? Toast. ? Crackers.  Do not eat or drink: ? Fluids that have a lot of sugar or caffeine. ? Alcohol. ? Spicy or fatty foods. General instructions  Take over-the-counter and prescription medicines only as told by your doctor.  Use a cool mist humidifier to add moisture to the air in your home. This can make it easier for you to breathe. ? When using a cool mist humidifier, clean it daily. Empty water and replace with clean water.  Cover your mouth and nose when you cough or sneeze.  Wash your hands with soap and water often and for at least 20 seconds. This is also important after you cough or sneeze. If you cannot use soap and water, use alcohol-based hand sanitizer.  Keep all follow-up visits.      How is this prevented?  Get a flu shot every year. You may get the flu shot in late summer, fall, or winter. Ask your doctor when you should get your flu shot.  Avoid contact with people who are sick during fall and winter. This is cold and flu season.   Contact a doctor if:  You get new symptoms.  You have: ? Chest pain. ? Watery poop (diarrhea). ? A fever.  Your cough gets worse.  You start to have more mucus.  You feel sick to your stomach.  You throw up. Get help right away if you:  Have shortness of breath.  Have trouble breathing.  Have skin or nails that turn a bluish color.  Have very bad pain or stiffness in your neck.  Get a sudden headache.  Get sudden pain in your face or ear.  Cannot eat or drink without throwing  up. These symptoms may represent a serious problem that is an emergency. Get medical help right away. Call your local emergency services (911 in the [place]  Do not wait to see if the symptoms will go away.  Do not drive yourself to the hospital. Summary  Influenza is also called "the flu." It is an infection in the lungs, nose, and throat. It spreads easily from person to person.  Take over-the-counter and prescription medicines only as told by your doctor.  Getting a flu shot every year is the best way to not get the flu. This information is not intended to replace advice given to you by your health care provider. Make sure you discuss any questions you have with your health care provider. Document Revised: 01/27/2020 Document Reviewed: 01/27/2020 Elsevier Patient Education  2021 [employer].

## 2020-09-04 NOTE — Addendum Note (Signed)
Addended by: Grayce Sessions on: 09/04/2020 09:08 AM   Modules accepted: Level of Service

## 2020-09-05 LAB — CMP14+EGFR
ALT: 52 IU/L — ABNORMAL HIGH (ref 0–44)
AST: 47 IU/L — ABNORMAL HIGH (ref 0–40)
Albumin/Globulin Ratio: 1.6 (ref 1.2–2.2)
Albumin: 4.1 g/dL (ref 3.8–4.9)
Alkaline Phosphatase: 118 IU/L (ref 44–121)
BUN/Creatinine Ratio: 13 (ref 9–20)
BUN: 22 mg/dL (ref 6–24)
Bilirubin Total: 0.3 mg/dL (ref 0.0–1.2)
CO2: 25 mmol/L (ref 20–29)
Calcium: 9.4 mg/dL (ref 8.7–10.2)
Chloride: 100 mmol/L (ref 96–106)
Creatinine, Ser: 1.75 mg/dL — ABNORMAL HIGH (ref 0.76–1.27)
Globulin, Total: 2.5 g/dL (ref 1.5–4.5)
Glucose: 74 mg/dL (ref 65–99)
Potassium: 4.5 mmol/L (ref 3.5–5.2)
Sodium: 138 mmol/L (ref 134–144)
Total Protein: 6.6 g/dL (ref 6.0–8.5)
eGFR: 46 mL/min/{1.73_m2} — ABNORMAL LOW (ref >59)

## 2020-09-05 LAB — CBC WITH DIFFERENTIAL/PLATELET
Basophils Absolute: 0.1 10*3/uL (ref 0.0–0.2)
Basos: 1 %
EOS (ABSOLUTE): 0.5 10*3/uL — ABNORMAL HIGH (ref 0.0–0.4)
Eos: 9 %
Hematocrit: 39.8 % (ref 37.5–51.0)
Hemoglobin: 13 g/dL (ref 13.0–17.7)
Immature Grans (Abs): 0 10*3/uL (ref 0.0–0.1)
Immature Granulocytes: 0 %
Lymphocytes Absolute: 1.9 10*3/uL (ref 0.7–3.1)
Lymphs: 38 %
MCH: 30.3 pg (ref 26.6–33.0)
MCHC: 32.7 g/dL (ref 31.5–35.7)
MCV: 93 fL (ref 79–97)
Monocytes Absolute: 0.5 10*3/uL (ref 0.1–0.9)
Monocytes: 10 %
Neutrophils Absolute: 2.2 10*3/uL (ref 1.4–7.0)
Neutrophils: 42 %
Platelets: 210 10*3/uL (ref 150–450)
RBC: 4.29 x10E6/uL (ref 4.14–5.80)
RDW: 14.3 % (ref 11.6–15.4)
WBC: 5.1 10*3/uL (ref 3.4–10.8)

## 2020-09-17 ENCOUNTER — Telehealth: Payer: Medicare Other | Admitting: Physician Assistant

## 2020-09-17 DIAGNOSIS — R509 Fever, unspecified: Secondary | ICD-10-CM

## 2020-09-17 DIAGNOSIS — K625 Hemorrhage of anus and rectum: Secondary | ICD-10-CM

## 2020-09-17 NOTE — Progress Notes (Signed)
Based on what you shared with me, I feel your condition warrants further evaluation and I recommend that you be seen for a face to face office visit. Giving that you answered no to most of the symptoms that would raise risk for hemorrhoid, the mention of fever and darker blood, you need to be seen in person for full evaluation to determine cause of symptoms as these do not typically come with a hemorrhoid. Typically is brighter blood noted with wiping in the absence of fever. Please be seen today at your PCP office or urgent care. Please do not delay care!   NOTE: If you entered your credit card information for this eVisit, you will not be charged. You may see a "hold" on your card for the $35 but that hold will drop off and you will not have a charge processed.   If you are having a true medical emergency please call 911.      For an urgent face to face visit, [facility] has five urgent care centers for your convenience:     [facility] at [facility] Directions [phone] [address] [place], [place] [address] . 10 am - 6pm Monday - Friday    [facility]) Get Driving Directions [phone] [address] [place], [place] [address] . 10 am to 8 pm Monday-Friday . 12 pm to 8 pm [facility] at [facility] Get Driving Directions [phone] [place], [place] [address] . 8 am to 8 pm Monday-Friday . 9 am to 6 pm Saturday . 11 am to 6 pm Sunday     [facility] at [facility] Get Driving Directions  [phone] [address] [place], [place] [address] . 8 am to 8 pm Monday-Friday . 8 am to 4 pm [facility]gent Care at [facility] Directions [phone] [address] [place], [place] [address] . 12 pm to 6 pm Monday-Friday      Your e-visit answers were  reviewed by a board certified advanced clinical practitioner to complete your personal care plan.  Thank you for using e-Visits.

## 2020-11-15 ENCOUNTER — Ambulatory Visit: Payer: Self-pay | Admitting: Nurse Practitioner

## 2020-12-26 DIAGNOSIS — F250 Schizoaffective disorder, bipolar type: Secondary | ICD-10-CM | POA: Diagnosis not present

## 2021-01-15 ENCOUNTER — Telehealth (INDEPENDENT_AMBULATORY_CARE_PROVIDER_SITE_OTHER): Payer: Self-pay | Admitting: Primary Care

## 2021-04-18 DIAGNOSIS — F250 Schizoaffective disorder, bipolar type: Secondary | ICD-10-CM | POA: Diagnosis not present

## 2021-05-20 ENCOUNTER — Telehealth (INDEPENDENT_AMBULATORY_CARE_PROVIDER_SITE_OTHER): Payer: Self-pay | Admitting: Primary Care

## 2021-05-20 NOTE — Telephone Encounter (Signed)
Left message asking patient to return call to office to provide further details on need for lab work. Has not bee seen in office in over 6 months.

## 2021-05-20 NOTE — Telephone Encounter (Signed)
Patient called in to get orders for lab work.

## 2021-06-27 ENCOUNTER — Encounter (INDEPENDENT_AMBULATORY_CARE_PROVIDER_SITE_OTHER): Payer: Self-pay

## 2021-06-27 ENCOUNTER — Ambulatory Visit (INDEPENDENT_AMBULATORY_CARE_PROVIDER_SITE_OTHER): Payer: Medicare HMO | Admitting: Primary Care

## 2021-06-27 ENCOUNTER — Other Ambulatory Visit: Payer: Self-pay

## 2021-07-11 DIAGNOSIS — F250 Schizoaffective disorder, bipolar type: Secondary | ICD-10-CM | POA: Diagnosis not present

## 2021-09-26 DIAGNOSIS — F250 Schizoaffective disorder, bipolar type: Secondary | ICD-10-CM | POA: Diagnosis not present

## 2021-10-07 DIAGNOSIS — F250 Schizoaffective disorder, bipolar type: Secondary | ICD-10-CM | POA: Diagnosis not present

## 2021-11-11 DIAGNOSIS — Z1211 Encounter for screening for malignant neoplasm of colon: Secondary | ICD-10-CM | POA: Diagnosis not present

## 2021-11-11 DIAGNOSIS — F17200 Nicotine dependence, unspecified, uncomplicated: Secondary | ICD-10-CM | POA: Diagnosis not present

## 2021-11-11 DIAGNOSIS — F331 Major depressive disorder, recurrent, moderate: Secondary | ICD-10-CM | POA: Diagnosis not present

## 2021-11-11 DIAGNOSIS — F209 Schizophrenia, unspecified: Secondary | ICD-10-CM | POA: Diagnosis not present

## 2021-11-11 DIAGNOSIS — F1020 Alcohol dependence, uncomplicated: Secondary | ICD-10-CM | POA: Diagnosis not present

## 2021-11-11 DIAGNOSIS — I252 Old myocardial infarction: Secondary | ICD-10-CM | POA: Diagnosis not present

## 2021-11-11 DIAGNOSIS — N529 Male erectile dysfunction, unspecified: Secondary | ICD-10-CM | POA: Diagnosis not present

## 2021-11-11 DIAGNOSIS — Z7689 Persons encountering health services in other specified circumstances: Secondary | ICD-10-CM | POA: Diagnosis not present

## 2022-02-17 DIAGNOSIS — F250 Schizoaffective disorder, bipolar type: Secondary | ICD-10-CM | POA: Diagnosis not present

## 2022-04-12 IMAGING — CT CT ABD-PELV W/ CM
2 of 5 series · 13 of 46 positions shown, 15 images · IV contrast (Omni 300)
Comparison: None.

CLINICAL DATA: Abdominal distension, bloody stool.

EXAM:
CT ABDOMEN AND PELVIS WITH CONTRAST
TECHNIQUE: Multidetector CT imaging of the abdomen and pelvis was performed
using the standard protocol following bolus administration of
intravenous contrast.
CONTRAST:  100mL OMNIPAQUE IOHEXOL 300 MG/ML  SOLN

[Series 3: a/p w/ 5mm · axial · 0.64mm/px · z∈[+866,+1271]mm · 10 of 93 slices shown, 12 images]
[im 6/93  soft-tissue]
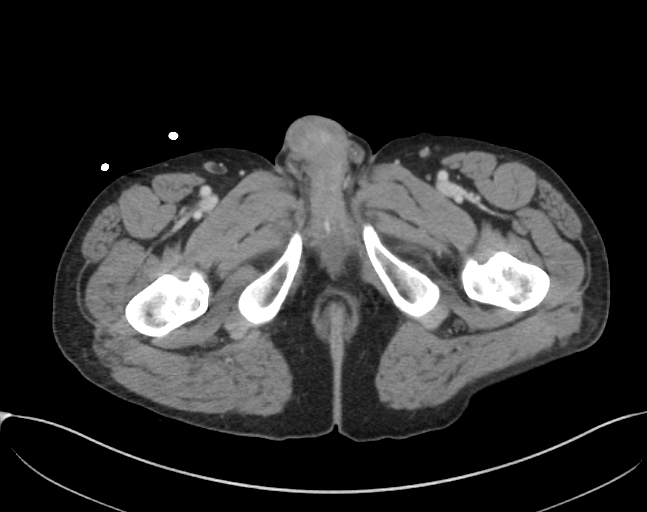
[im 6/93  bone]
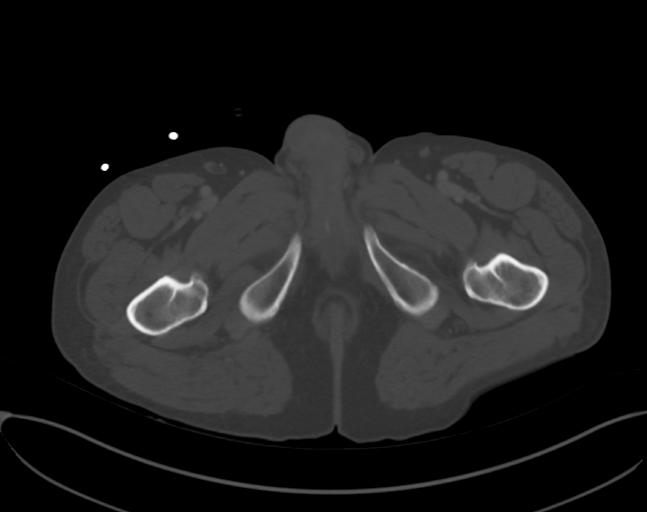
[im 16/93  soft-tissue]
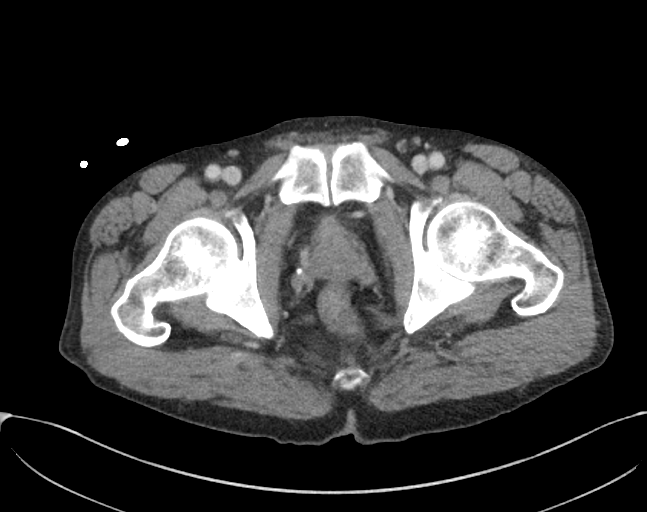
[im 26/93  soft-tissue]
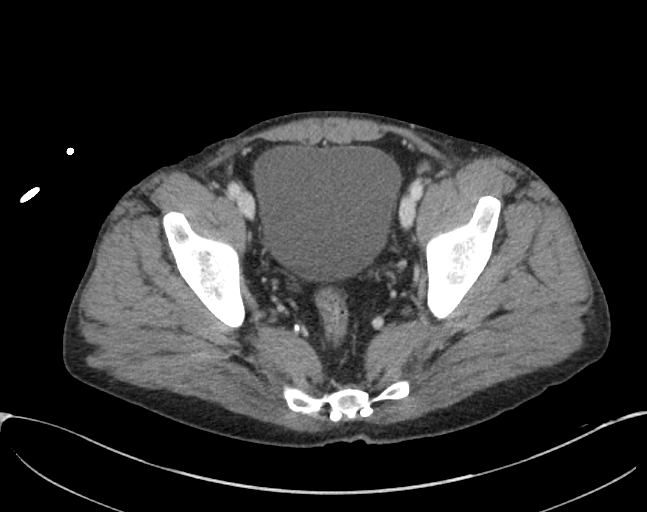
[im 31/93  soft-tissue]
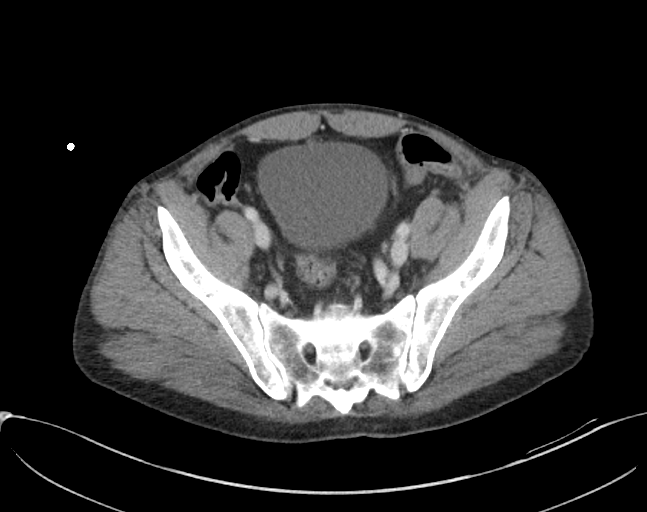
[im 41/93  soft-tissue]
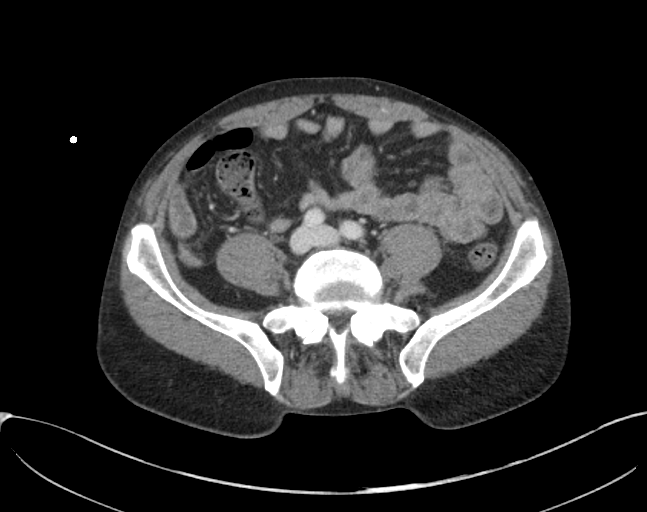
[im 52/93  soft-tissue]
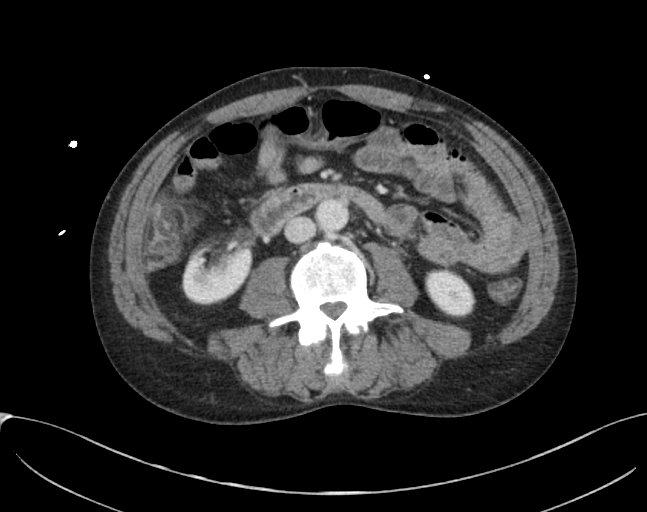
[im 62/93  soft-tissue]
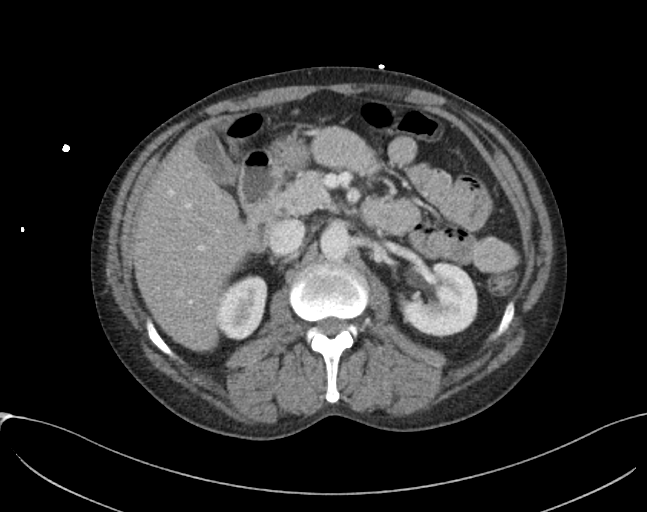
[im 67/93  soft-tissue]
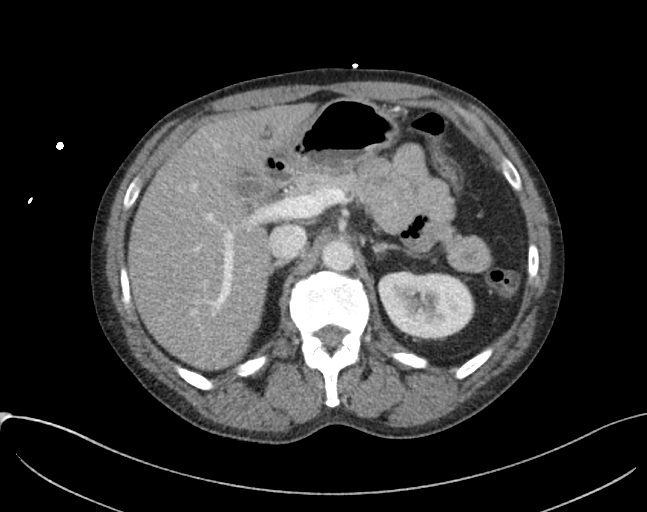
[im 77/93  soft-tissue]
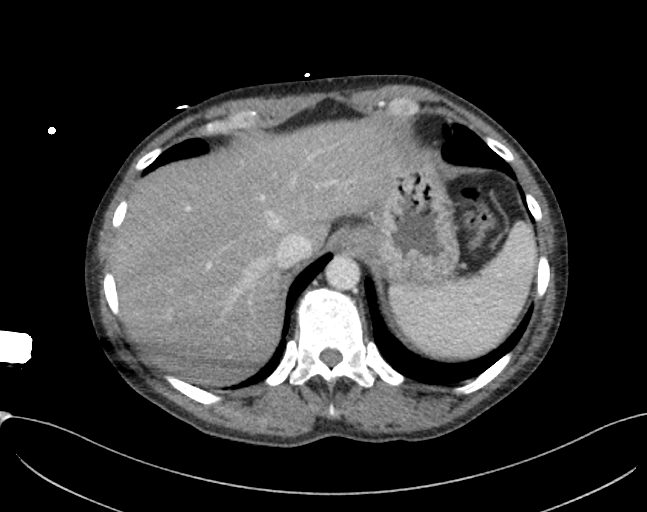
[im 77/93  bone]
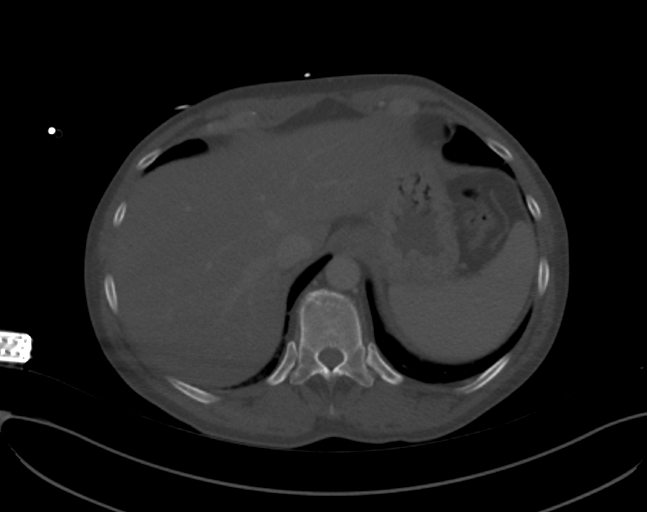
[im 87/93  soft-tissue]
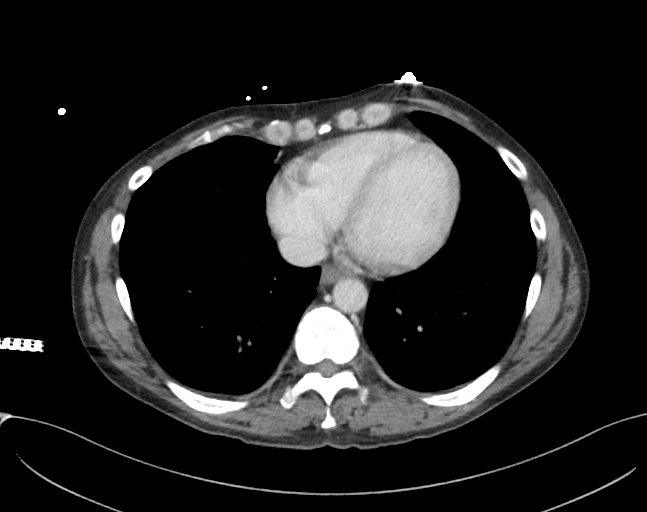

[Series 6: a/p w/ cor · coronal · 0.79mm/px · 3 of 150 slices shown]
[im 50/150  soft-tissue]
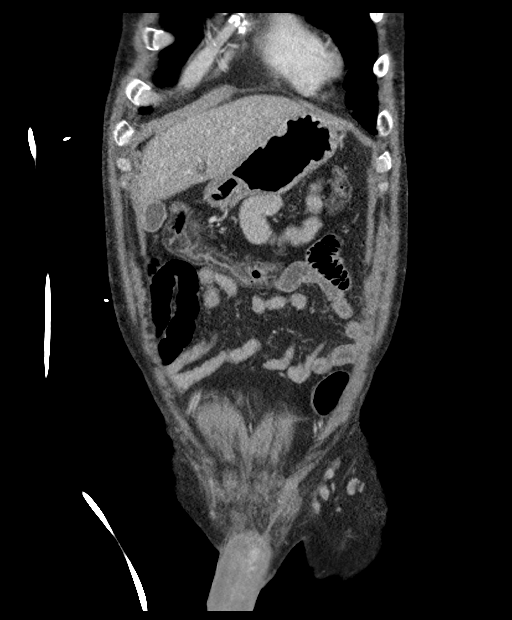
[im 67/150  soft-tissue]
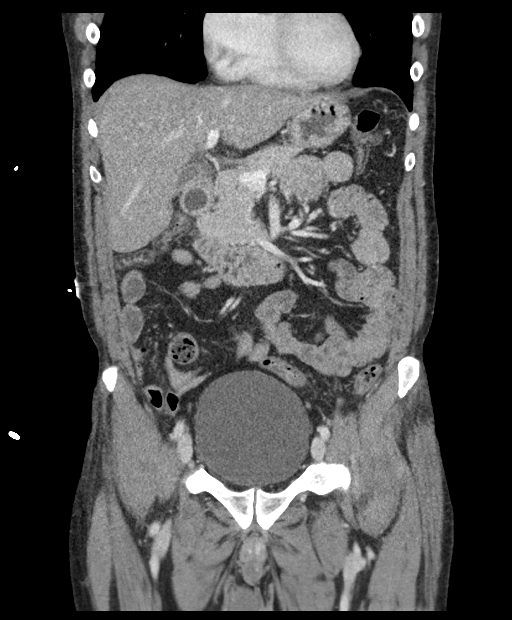
[im 83/150  soft-tissue]
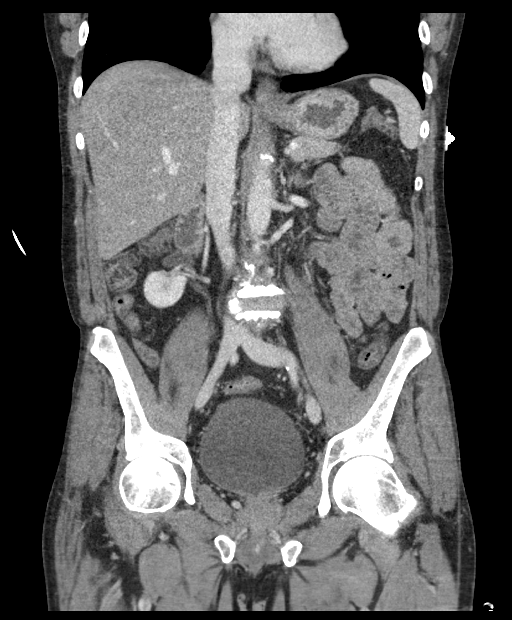

[13 of 46 positions shown; findings below may reference images not displayed]

FINDINGS: Lower chest: No acute abnormality.

Hepatobiliary: No focal liver abnormality is seen. No gallstones,
gallbladder wall thickening, or biliary dilatation.

Pancreas: Unremarkable. No pancreatic ductal dilatation or
surrounding inflammatory changes.

Spleen: Normal in size without focal abnormality.

Adrenals/Urinary Tract: Adrenal glands are unremarkable. Kidneys are
normal, without renal calculi, focal lesion, or hydronephrosis.
Bladder is unremarkable.

Stomach/Bowel: Stomach is within normal limits. Appendix appears
normal. No evidence of bowel wall thickening, distention, or
inflammatory changes.

Vascular/Lymphatic: No significant vascular findings are present. No
enlarged abdominal or pelvic lymph nodes.

Reproductive: Prostate is unremarkable.

Other: No abdominal wall hernia or abnormality. No abdominopelvic
ascites.

Musculoskeletal: No acute or significant osseous findings.
IMPRESSION: No abnormality seen in the abdomen or pelvis.

## 2022-05-01 DIAGNOSIS — F250 Schizoaffective disorder, bipolar type: Secondary | ICD-10-CM | POA: Diagnosis not present

## 2022-07-21 DIAGNOSIS — F250 Schizoaffective disorder, bipolar type: Secondary | ICD-10-CM | POA: Diagnosis not present

## 2022-09-29 DIAGNOSIS — F250 Schizoaffective disorder, bipolar type: Secondary | ICD-10-CM | POA: Diagnosis not present

## 2022-10-08 DIAGNOSIS — F250 Schizoaffective disorder, bipolar type: Secondary | ICD-10-CM | POA: Diagnosis not present

## 2022-12-09 ENCOUNTER — Ambulatory Visit (INDEPENDENT_AMBULATORY_CARE_PROVIDER_SITE_OTHER): Payer: Medicare HMO | Admitting: Internal Medicine

## 2022-12-09 ENCOUNTER — Encounter: Payer: Self-pay | Admitting: Internal Medicine

## 2022-12-09 VITALS — BP 110/70 | HR 56 | Temp 97.8°F | Ht 75.0 in | Wt 189.0 lb

## 2022-12-09 DIAGNOSIS — K921 Melena: Secondary | ICD-10-CM | POA: Diagnosis not present

## 2022-12-09 DIAGNOSIS — F1010 Alcohol abuse, uncomplicated: Secondary | ICD-10-CM | POA: Diagnosis not present

## 2022-12-09 DIAGNOSIS — Z1211 Encounter for screening for malignant neoplasm of colon: Secondary | ICD-10-CM

## 2022-12-09 DIAGNOSIS — F259 Schizoaffective disorder, unspecified: Secondary | ICD-10-CM | POA: Diagnosis not present

## 2022-12-09 MED ORDER — PANTOPRAZOLE SODIUM 40 MG PO TBEC
40.0000 mg | DELAYED_RELEASE_TABLET | Freq: Two times a day (BID) | ORAL | 0 refills | Status: DC
Start: 2022-12-09 — End: 2023-04-30

## 2022-12-09 NOTE — Progress Notes (Signed)
New Patient Office Visit     CC/Reason for Visit: Establish care, discuss acute concern Previous PCP: None Last Visit: Unknown  HPI: Dean Howard. is a 57 y.o. male who is coming in today for the above mentioned reasons. Past Medical History is significant for: Schizophrenia and bipolar disorder, alcohol abuse.  He is here to establish care.  He has not had a primary physician in years.  He has been complaining of melena x 2 months.  He has also been having epigastric pain, nausea, decreased appetite and about a 20 pound weight loss in that timeframe.  He has never had a colonoscopy.  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  He drinks a sixpack of beer a day, smokes about 1/2 pack a day and has done so for around 25 years.  Very infrequent NSAID use.   Past Medical/Surgical History: Past Medical History:  Diagnosis Date   Alcohol abuse    Anxiety    Coronary artery disease    Depression    Heart murmur    MI, old    Sleep apnea    Tick fever     Past Surgical History:  Procedure Laterality Date   HERNIA REPAIR     KNEE SURGERY      Social History:  reports that he has been smoking cigarettes. He has a 7.50 pack-year smoking history. He has never used smokeless tobacco. He reports current alcohol use of about 6.0 standard drinks of alcohol per week. He reports current drug use. Drug: Marijuana.  Allergies: No Known Allergies  Family History:  Family History  Problem Relation Age of Onset   COPD Mother      Current Outpatient Medications:    cariprazine 4.5 MG CAPS, Take 1 capsule (4.5 mg total) by mouth daily., Disp: 30 capsule, Rfl: 1   lithium carbonate (ESKALITH) 450 MG CR tablet, Take 1 tablet (450 mg total) by mouth at bedtime., Disp: 30 tablet, Rfl: 1   pantoprazole (PROTONIX) 40 MG tablet, Take 1 tablet (40 mg total) by mouth 2 (two) times daily., Disp: 180 tablet, Rfl: 0   traZODone (DESYREL) 50 MG tablet, Take 1 tablet (50 mg total) by  mouth at bedtime., Disp:  , Rfl:   Review of Systems:  Negative except as indicated in HPI.   Physical Exam: Vitals:   12/09/22 1442  BP: 110/70  Pulse: (!) 56  Temp: 97.8 F (36.6 C)  TempSrc: Oral  SpO2: 100%  Weight: 189 lb (85.7 kg)  Height: 6\' 3"  (1.905 m)   Body mass index is 23.62 kg/m.  Physical Exam Vitals reviewed.  Constitutional:      Appearance: Normal appearance.  HENT:     Head: Normocephalic and atraumatic.  Eyes:     Conjunctiva/sclera: Conjunctivae normal.     Pupils: Pupils are equal, round, and reactive to light.  Cardiovascular:     Rate and Rhythm: Normal rate and regular rhythm.  Pulmonary:     Effort: Pulmonary effort is normal.     Breath sounds: Normal breath sounds.  Skin:    General: Skin is warm and dry.  Neurological:     General: No focal deficit present.     Mental Status: He is alert and oriented to person, place, and time.  Psychiatric:        Mood and Affect: Mood normal.        Behavior: Behavior normal.  Thought Content: Thought content normal.        Judgment: Judgment normal.       Impression and Plan:  Melena -     CBC with Differential/Platelet; Future -     Comprehensive metabolic panel; Future -     Ambulatory referral to Gastroenterology -     Pantoprazole Sodium; Take 1 tablet (40 mg total) by mouth 2 (two) times daily.  Dispense: 180 tablet; Refill: 0  Screening for malignant neoplasm of colon  Alcohol abuse  Schizo-affective schizophrenia, chronic condition with acute exacerbation (HCC)  -Concern for peptic ulcer disease given symptoms.  Check CBC and CMP today.  Start twice daily Protonix, refer urgently to GI for EGD, is also overdue for colonoscopy.  Advised on alcohol cessation as well as no further NSAID use.  Time spent: 46 minutes reviewing chart, interviewing and examining patient and formulating plan of care.    Chaya Jan, MD [facility] at [facility]

## 2022-12-10 LAB — COMPREHENSIVE METABOLIC PANEL
ALT: 16 U/L (ref 0–53)
AST: 24 U/L (ref 0–37)
Albumin: 4 g/dL (ref 3.5–5.2)
Alkaline Phosphatase: 63 U/L (ref 39–117)
BUN: 13 mg/dL (ref 6–23)
CO2: 26 mEq/L (ref 19–32)
Calcium: 9.2 mg/dL (ref 8.4–10.5)
Chloride: 104 mEq/L (ref 96–112)
Creatinine, Ser: 1.59 mg/dL — ABNORMAL HIGH (ref 0.40–1.50)
GFR: 48.53 mL/min — ABNORMAL LOW (ref >60.00)
Glucose, Bld: 64 mg/dL — ABNORMAL LOW (ref 70–99)
Potassium: 3.9 mEq/L (ref 3.5–5.1)
Sodium: 136 mEq/L (ref 135–145)
Total Bilirubin: 0.5 mg/dL (ref 0.2–1.2)
Total Protein: 7.3 g/dL (ref 6.0–8.3)

## 2022-12-10 LAB — CBC WITH DIFFERENTIAL/PLATELET
Basophils Absolute: 0 10*3/uL (ref 0.0–0.1)
Basophils Relative: 0.7 % (ref 0.0–3.0)
Eosinophils Absolute: 0.3 10*3/uL (ref 0.0–0.7)
Eosinophils Relative: 8.4 % — ABNORMAL HIGH (ref 0.0–5.0)
HCT: 43.7 % (ref 39.0–52.0)
Hemoglobin: 14.3 g/dL (ref 13.0–17.0)
Lymphocytes Relative: 48.7 % — ABNORMAL HIGH (ref 12.0–46.0)
Lymphs Abs: 1.8 10*3/uL (ref 0.7–4.0)
MCHC: 32.8 g/dL (ref 30.0–36.0)
MCV: 94.8 fl (ref 78.0–100.0)
Monocytes Absolute: 0.5 10*3/uL (ref 0.1–1.0)
Monocytes Relative: 13.5 % — ABNORMAL HIGH (ref 3.0–12.0)
Neutro Abs: 1.1 10*3/uL — ABNORMAL LOW (ref 1.4–7.7)
Neutrophils Relative %: 28.7 % — ABNORMAL LOW (ref 43.0–77.0)
Platelets: 241 10*3/uL (ref 150.0–400.0)
RBC: 4.61 Mil/uL (ref 4.22–5.81)
RDW: 15.2 % (ref 11.5–15.5)
WBC: 3.8 10*3/uL — ABNORMAL LOW (ref 4.0–10.5)

## 2022-12-11 ENCOUNTER — Encounter: Payer: Self-pay | Admitting: Internal Medicine

## 2022-12-11 ENCOUNTER — Other Ambulatory Visit: Payer: Self-pay | Admitting: *Deleted

## 2022-12-11 DIAGNOSIS — N183 Chronic kidney disease, stage 3 unspecified: Secondary | ICD-10-CM

## 2022-12-11 DIAGNOSIS — R899 Unspecified abnormal finding in specimens from other organs, systems and tissues: Secondary | ICD-10-CM

## 2022-12-15 ENCOUNTER — Encounter: Payer: Self-pay | Admitting: Gastroenterology

## 2022-12-26 DIAGNOSIS — F250 Schizoaffective disorder, bipolar type: Secondary | ICD-10-CM | POA: Diagnosis not present

## 2023-01-16 DIAGNOSIS — F250 Schizoaffective disorder, bipolar type: Secondary | ICD-10-CM | POA: Diagnosis not present

## 2023-01-24 ENCOUNTER — Other Ambulatory Visit: Payer: Self-pay

## 2023-01-24 ENCOUNTER — Emergency Department (HOSPITAL_COMMUNITY)
Admission: EM | Admit: 2023-01-24 | Discharge: 2023-01-24 | Disposition: A | Discharge status: Home / Self Care | Payer: Medicare HMO | Attending: Emergency Medicine | Admitting: Emergency Medicine

## 2023-01-24 ENCOUNTER — Emergency Department (HOSPITAL_COMMUNITY): Payer: Medicare HMO

## 2023-01-24 ENCOUNTER — Encounter (HOSPITAL_COMMUNITY): Payer: Self-pay

## 2023-01-24 DIAGNOSIS — M549 Dorsalgia, unspecified: Secondary | ICD-10-CM | POA: Diagnosis not present

## 2023-01-24 DIAGNOSIS — M545 Low back pain, unspecified: Secondary | ICD-10-CM | POA: Insufficient documentation

## 2023-01-24 DIAGNOSIS — M5459 Other low back pain: Secondary | ICD-10-CM | POA: Diagnosis not present

## 2023-01-24 DIAGNOSIS — M5136 Other intervertebral disc degeneration, lumbar region: Secondary | ICD-10-CM | POA: Diagnosis not present

## 2023-01-24 DIAGNOSIS — F17200 Nicotine dependence, unspecified, uncomplicated: Secondary | ICD-10-CM | POA: Insufficient documentation

## 2023-01-24 DIAGNOSIS — M47816 Spondylosis without myelopathy or radiculopathy, lumbar region: Secondary | ICD-10-CM | POA: Diagnosis not present

## 2023-01-24 DIAGNOSIS — M5137 Other intervertebral disc degeneration, lumbosacral region: Secondary | ICD-10-CM | POA: Diagnosis not present

## 2023-01-24 MED ORDER — ACETAMINOPHEN 500 MG PO TABS
1000.0000 mg | ORAL_TABLET | Freq: Once | ORAL | Status: AC
  Administered 2023-01-24: 1000 mg via ORAL
  Filled 2023-01-24: qty 2

## 2023-01-24 MED ORDER — METHOCARBAMOL 500 MG PO TABS: 500.0000 mg | ORAL_TABLET | Freq: Two times a day (BID) | ORAL | 0 refills | Status: DC

## 2023-01-24 MED ORDER — LIDOCAINE 5 % EX PTCH
1.0000 | MEDICATED_PATCH | CUTANEOUS | Status: DC
  Administered 2023-01-24: 1 via TRANSDERMAL
  Filled 2023-01-24: qty 1

## 2023-01-24 NOTE — ED Triage Notes (Signed)
Pt BIB [facility] from home d/t back pain. Working hard on Tuesday might had twisted his back. Does not radiate. Mildly relieved by OTC. A&O X4. VSS.

## 2023-01-24 NOTE — ED Provider Notes (Signed)
[place] EMERGENCY DEPARTMENT AT [facility] Provider Note   CSN: [id number] Arrival date & time: 01/24/23  4098     History Chief Complaint  Patient presents with   Back Pain    Dean Howard. is a 57 y.o. male with h/o bipolar/schizophrenia presents emerged from today for evaluation of low back pain since Tuesday.  Patient reports that starting back pain after lifting up heavy bags of trash.  He did not bend his back or have any blunt back trauma.  He denies any radiation of his pain.  He denies any trouble with urination or any fecal or urinary incontinence.  Denies any saddle anesthesia, fevers, or any IV drug use.  He reports he tried some Goody's powder for back pain which helped him some.  Reports some pain with walking, but no leg weakness. He is a smoker but denies any IV drug use specifically.  Occasional alcohol use with 3-4 beers per week.   Back Pain Associated symptoms: no chest pain, no dysuria, no fever, no numbness and no weakness        Home Medications Prior to Admission medications   Medication Sig Start Date End Date Taking? Authorizing Provider  cariprazine 4.5 MG CAPS Take 1 capsule (4.5 mg total) by mouth daily. 08/06/19   Money, Gerlene Burdock, FNP  lithium carbonate (ESKALITH) 450 MG CR tablet Take 1 tablet (450 mg total) by mouth at bedtime. 08/05/19   Money, Gerlene Burdock, FNP  pantoprazole (PROTONIX) 40 MG tablet Take 1 tablet (40 mg total) by mouth 2 (two) times daily. 12/09/22   Philip Aspen, Limmie Patricia, MD  traZODone (DESYREL) 50 MG tablet Take 1 tablet (50 mg total) by mouth at bedtime. 11/30/19   Duayne Cal, NP      Allergies    Patient has no known allergies.    Review of Systems   Review of Systems  Constitutional:  Negative for chills and fever.  Respiratory:  Negative for shortness of breath.   Cardiovascular:  Negative for chest pain.  Gastrointestinal:  Negative for abdominal distention, constipation, diarrhea, nausea and  vomiting.       Denies any fecal incontinence  Genitourinary:  Negative for dysuria and hematuria.       Denies any urinary incontinence or urinary retention.  Musculoskeletal:  Positive for back pain.       Denies any saddle anesthesia  Neurological:  Negative for weakness and numbness.    Physical Exam Updated Vital Signs BP 120/72 (BP Location: Right Arm)   Pulse (!) 50   Temp 97.8 F (36.6 C) (Oral)   Resp 20   SpO2 98%  Physical Exam Vitals and nursing note reviewed.  Constitutional:      General: He is not in acute distress.    Appearance: He is not ill-appearing or toxic-appearing.  Eyes:     General: No scleral icterus. Pulmonary:     Effort: Pulmonary effort is normal. No respiratory distress.  Musculoskeletal:        General: Tenderness present.       Back:     Comments: Diffuse lower back tenderness palpation.  No specific point tenderness.  No overlying skin changes noted.  No overlying warmth or erythema.  No fluctuance or induration.  No rashes or lesions seen.  No other midline or paraspinal tenderness to palpation.  Strength is intact in patient's upper and lower bilateral extremities.  Sensation intact throughout reportedly per patient.  He has palpable  radial pulses as well as DP and PT pulses bilaterally.  Strong plantar and dorsiflexion.  Does have positive straight leg raise on the left.  Skin:    General: Skin is warm and dry.  Neurological:     General: No focal deficit present.     Mental Status: He is alert.     Motor: No weakness.  Psychiatric:        Attention and Perception: Attention normal.        Mood and Affect: Mood normal.        Speech: Speech normal.        Behavior: Behavior is cooperative.        Thought Content: Thought content is not paranoid.     ED Results / Procedures / Treatments   Labs (all labs ordered are listed, but only abnormal results are displayed) Labs Reviewed - No data to display  EKG None  Radiology CT  Lumbar Spine Wo Contrast  Result Date: 01/24/2023 CLINICAL DATA:  Back pain.  Possible twisting injury. EXAM: CT LUMBAR SPINE WITHOUT CONTRAST TECHNIQUE: Multidetector CT imaging of the lumbar spine was performed without intravenous contrast administration. Multiplanar CT image reconstructions were also generated. RADIATION DOSE REDUCTION: This exam was performed according to the departmental dose-optimization program which includes automated exposure control, adjustment of the mA and/or kV according to patient size and/or use of iterative reconstruction technique. COMPARISON:  None Available. FINDINGS: Segmentation: 5 lumbar type vertebrae with sacralization of the L5 level. Alignment: Normal. Vertebrae: Degenerative changes noted inferior L3 vertebral body with anterior spurring around the L3-4 disc space. No worrisome lytic or sclerotic osseous abnormality. Paraspinal and other soft tissues: Negative. Disc levels: T12-L1 through L3-4: Widely patent without central canal or foraminal stenosis. L4-5: Mild posterior broad-based bulging disc with some thickening of the ligamentum flavum. This results in mild multifactorial central canal stenosis. There is mild foraminal encroachment bilaterally, mainly due to congenitally short pedicles. L5-S1: Posterior broad-based bulging disc with mild multifactorial central canal stenosis. IMPRESSION: No acute bony abnormality. Degenerative changes noted L3-4 with mild multifactorial central canal stenosis at L4-5 and L5-S1. Electronically Signed   By: Kennith Center M.D.   On: 01/24/2023 09:53    Procedures Procedures   Medications Ordered in ED Medications  lidocaine (LIDODERM) 5 % 1 patch (1 patch Transdermal Patch Applied 01/24/23 1042)  acetaminophen (TYLENOL) tablet 1,000 mg (1,000 mg Oral Given 01/24/23 1042)    ED Course/ Medical Decision Making/ A&P Clinical Course as of 01/24/23 1047  Sat Jan 24, 2023  1020 CT Lumbar Spine Wo Contrast [RR]    Clinical Course  User Index [RR] Achille Rich, PA-C    Medical Decision Making Amount and/or Complexity of Data Reviewed Radiology: ordered. Decision-making details documented in ED Course.  Risk OTC drugs. Prescription drug management.  57 y.o. male presents to the ER for evaluation of low back pain. Differential diagnosis includes but is not limited to Fracture (acute/chronic), muscle strain, cauda equina, spinal stenosis, DDD, ligamentous injury, disk herniation, metastatic cancer, vertebral osteomyelitis, kidney stone, pyelonephritis, AAA, pancreatitis, bowel obstruction, meningitis. Vital signs show bradycardia which is typical for the patient, otherwise unremarkable. Physical exam as noted above.   CT of the lumbar shows No acute bony abnormality. Degenerative changes noted L3-4 with mild multifactorial central canal stenosis at L4-5 and L5-S1.  The patient does not have any saddle anesthesia, fever, or history of IV drug use.  He denies any fecal or urinary incontinence.  Denies any trouble with  his urination.  Given this, very low suspicion for any cauda equina.  He does not have any focal weakness or any trouble walking but does have pain with walking.  He has diffuse lower back tenderness to the lower lumbar.  No specific point tenderness.  I do not appreciate any overlying skin changes or step-offs or deformities.  He cams afebrile with reassuring vital signs and denies any IV drug use, I did have listed for any osteomyelitis or epidural abscess.  He has no nuchal rigidity or any altered mental status or confusion, again afebrile, doubt any meningitis.  He has palpable DP and PT pulses bilaterally as well as good sensation.  Good strength as well.  I have a lower excision for any AAA.  He does not have any focal deficit for any central cord syndrome.  Will discharge home with neurosurgery follow-up.  Patient does have a history of gastric ulcers in the past, recommend Tylenol as well as topical lidocaine  patches. Recommended avoiding ibuprofen and Goody's powders.  Will prescribe him his muscle relaxers as well.  We discussed the results of the labs/imaging. The plan is follow up with neurosurgery, take medication as prescribed. We discussed strict return precautions and red flag symptoms. The patient verbalized their understanding and agrees to the plan. The patient is stable and being discharged home in good condition.  Portions of this report may have been transcribed using voice recognition software. Every effort was made to ensure accuracy; however, inadvertent computerized transcription errors may be present.   Final Clinical Impression(s) / ED Diagnoses Final diagnoses:  Acute bilateral low back pain without sciatica    Rx / DC Orders ED Discharge Orders          Ordered    methocarbamol (ROBAXIN) 500 MG tablet  2 times daily        01/24/23 1123              Achille Rich, [place] 01/24/23 1129    Lonell Grandchild, MD 01/25/23 1035

## 2023-01-24 NOTE — Discharge Instructions (Addendum)
You were seen in the emergency room today for evaluation of your back pain.  Your CT scan shows some degenerative disc disease.  I would like for you to follow-up with [facility] about this.  For pain, like you to take at 1000mg  of Tylenol every 6 hours as needed.  You can also try over-the-counter lidocaine patches as well.  I have prescribed you a few muscle relaxers to take as needed.  Please do not drive or operate heavy machinery while on these medications as they can make you sleepy.  Please do not drink alcohol while on the muscle relaxer as well.  Again, please make sure you call the neurosurgeon to schedule follow-up appointment.  If you have any concerns, new or worsening symptoms, please return to your nearest emergency department for evaluation.  Contact a health care provider if: You have pain that is not relieved with rest or medicine. You have increasing pain going down into your legs or buttocks. Your pain does not improve after 2 weeks. You have pain at night. You lose weight without trying. You have a fever or chills. You develop nausea or vomiting. You develop abdominal pain. Get help right away if: You develop new bowel or bladder control problems. You have unusual weakness or numbness in your arms or legs. You feel faint. These symptoms may represent a serious problem that is an emergency. Do not wait to see if the symptoms will go away. Get medical help right away. Call your local emergency services (911 in the [place] Do not drive yourself to the hospital.

## 2023-02-11 ENCOUNTER — Inpatient Hospital Stay: Payer: Medicare HMO | Admitting: Internal Medicine

## 2023-02-18 ENCOUNTER — Encounter: Payer: Self-pay | Admitting: Gastroenterology

## 2023-02-18 ENCOUNTER — Ambulatory Visit (INDEPENDENT_AMBULATORY_CARE_PROVIDER_SITE_OTHER): Payer: Medicare HMO | Admitting: Gastroenterology

## 2023-02-18 VITALS — BP 110/72 | HR 64 | Ht 75.0 in | Wt 185.4 lb

## 2023-02-18 DIAGNOSIS — K625 Hemorrhage of anus and rectum: Secondary | ICD-10-CM | POA: Insufficient documentation

## 2023-02-18 DIAGNOSIS — K219 Gastro-esophageal reflux disease without esophagitis: Secondary | ICD-10-CM

## 2023-02-18 MED ORDER — NA SULFATE-K SULFATE-MG SULF 17.5-3.13-1.6 GM/177ML PO SOLN: 1.0000 | Freq: Once | ORAL | 0 refills | Status: AC

## 2023-02-18 NOTE — Progress Notes (Signed)
02/18/2023 Dean Howard [id number] 11/01/1965   HISTORY OF PRESENT ILLNESS: This is a 57 year old male who is new to our office.  He is here today to discuss and schedule colonoscopy.  He said he has never undergone colonoscopy in the past.  He does have intermittent rectal bleeding.  Says most the time it is bright red in color, but is dark in color at times.  He also tells me that he has been having a lot of acid reflux.  Started on pantoprazole 40 mg twice daily it looks like maybe back in June that was given to him from his PCP.  Has noticed some mild improvement with that.  Does not use NSAIDs.  Recent hemoglobin is normal.  No dysphagia.   Past Medical History:  Diagnosis Date   Alcohol abuse    Anxiety    CKD (chronic kidney disease) stage 3, GFR 30-59 ml/min (HCC) 12/11/2022   Coronary artery disease    Depression    Heart murmur    MI, old    Sleep apnea    Tick fever    Past Surgical History:  Procedure Laterality Date   HERNIA REPAIR     KNEE SURGERY      reports that he has been smoking cigarettes. He started smoking about 21 years ago. He has a 7.5 pack-year smoking history. He has never used smokeless tobacco. He reports current alcohol use of about 6.0 standard drinks of alcohol per week. He reports current drug use. Drug: Marijuana. family history includes COPD in his mother. No Known Allergies    Outpatient Encounter Medications as of 02/18/2023  Medication Sig   cariprazine 4.5 MG CAPS Take 1 capsule (4.5 mg total) by mouth daily.   lithium carbonate (ESKALITH) 450 MG CR tablet Take 1 tablet (450 mg total) by mouth at bedtime.   methocarbamol (ROBAXIN) 500 MG tablet Take 1 tablet (500 mg total) by mouth 2 (two) times daily.   pantoprazole (PROTONIX) 40 MG tablet Take 1 tablet (40 mg total) by mouth 2 (two) times daily.   traZODone (DESYREL) 50 MG tablet Take 1 tablet (50 mg total) by mouth at bedtime.   No facility-administered encounter  medications on file as of 02/18/2023.    REVIEW OF SYSTEMS  : All other systems reviewed and negative except where noted in the History of Present Illness.   PHYSICAL EXAM: BP 110/72   Pulse 64   Ht 6\' 3"  (1.905 m)   Wt 185 lb 6.4 oz (84.1 kg)   BMI 23.17 kg/m  General: Well developed male in no acute distress Head: Normocephalic and atraumatic Eyes:  Sclerae anicteric, conjunctiva pink. Ears: Normal auditory acuity Lungs: Clear throughout to auscultation; no W/R/R. Heart: Regular rate and rhythm; no M/R/G. Abdomen: Soft, non-distended.  BS present.  Non-tender. Rectal:  Will be done at the time of colonoscopy. Musculoskeletal: Symmetrical with no gross deformities  Skin: No lesions on visible extremities Extremities: No edema  Neurological: Alert oriented x 4, grossly non-focal Psychological:  Alert and cooperative. Normal mood and affect  ASSESSMENT AND PLAN: *Rectal bleeding: Intermittent red blood, sometimes darker in color.  Never had colonoscopy in the past.  Will schedule Dr. Rhea Belton. *GERD: He was having a lot of acid reflux, but just started pantoprazole 40 mg twice daily from his PCP for the last couple of months with some mild improvement.  Will plan for EGD for baseline assessment as well.  **The risks, benefits, and alternatives  to EGD and colonoscopy were discussed with the patient and he consents to proceed.  CC:  Philip Aspen, [place]*

## 2023-02-18 NOTE — Patient Instructions (Signed)
You have been scheduled for an endoscopy and colonoscopy. Please follow the written instructions given to you at your visit today.  Please pick up your prep supplies at the pharmacy within the next 1-3 days.  If you use inhalers (even only as needed), please bring them with you on the day of your procedure.  DO NOT TAKE 7 DAYS PRIOR TO TEST- Trulicity (dulaglutide) Ozempic, Wegovy (semaglutide) Mounjaro (tirzepatide) Bydureon Bcise (exanatide extended release)  DO NOT TAKE 1 DAY PRIOR TO YOUR TEST Rybelsus (semaglutide) Adlyxin (lixisenatide) Victoza (liraglutide) Byetta (exanatide) _______________________________________________________  If your blood pressure at your visit was 140/90 or greater, please contact your primary care physician to follow up on this.  _______________________________________________________  If you are age 27 or older, your body mass index should be between 23-30. Your Body mass index is 23.17 kg/m. If this is out of the aforementioned range listed, please consider follow up with your Primary Care Provider.  If you are age 57 or younger, your body mass index should be between 19-25. Your Body mass index is 23.17 kg/m. If this is out of the aformentioned range listed, please consider follow up with your Primary Care Provider.   ________________________________________________________  The [facility] providers would like to encourage you to use [facility] to communicate with providers for non-urgent requests or questions.  Due to long hold times on the telephone, sending your provider a message by [facility] may be a faster and more efficient way to get a response.  Please allow 48 business hours for a response.  Please remember that this is for non-urgent requests.  _______________________________________________________

## 2023-02-19 NOTE — Progress Notes (Signed)
Addendum: Reviewed and agree with assessment and management plan. Pyrtle, Jay M, MD  

## 2023-02-27 ENCOUNTER — Telehealth: Payer: Self-pay | Admitting: Gastroenterology

## 2023-02-27 MED ORDER — NA SULFATE-K SULFATE-MG SULF 17.5-3.13-1.6 GM/177ML PO SOLN: 1.0000 | Freq: Once | ORAL | 0 refills | Status: AC

## 2023-02-27 NOTE — Telephone Encounter (Signed)
Inbound call from patient stating pharmacy has not received prep for 11/7 procedures. Patient requesting a call back regarding prescription. Stated it is okay to call work phone number if he is unable to answer mobile phone number. Please advise, thank you.

## 2023-03-02 ENCOUNTER — Ambulatory Visit: Payer: Medicare HMO | Admitting: Internal Medicine

## 2023-03-02 NOTE — Telephone Encounter (Signed)
Script sent to pharmacy.

## 2023-03-27 ENCOUNTER — Other Ambulatory Visit: Payer: Self-pay | Admitting: Internal Medicine

## 2023-03-27 DIAGNOSIS — Z1211 Encounter for screening for malignant neoplasm of colon: Secondary | ICD-10-CM

## 2023-03-27 DIAGNOSIS — Z1212 Encounter for screening for malignant neoplasm of rectum: Secondary | ICD-10-CM

## 2023-04-20 DIAGNOSIS — F250 Schizoaffective disorder, bipolar type: Secondary | ICD-10-CM | POA: Diagnosis not present

## 2023-04-30 ENCOUNTER — Ambulatory Visit: Payer: Medicare HMO | Admitting: Internal Medicine

## 2023-04-30 ENCOUNTER — Encounter: Payer: Self-pay | Admitting: Internal Medicine

## 2023-04-30 VITALS — BP 102/58 | HR 50 | Temp 96.9°F | Resp 11 | Ht 75.0 in | Wt 185.0 lb

## 2023-04-30 DIAGNOSIS — K620 Anal polyp: Secondary | ICD-10-CM | POA: Diagnosis not present

## 2023-04-30 DIAGNOSIS — K219 Gastro-esophageal reflux disease without esophagitis: Secondary | ICD-10-CM | POA: Diagnosis not present

## 2023-04-30 DIAGNOSIS — F419 Anxiety disorder, unspecified: Secondary | ICD-10-CM | POA: Diagnosis not present

## 2023-04-30 DIAGNOSIS — K625 Hemorrhage of anus and rectum: Secondary | ICD-10-CM

## 2023-04-30 DIAGNOSIS — G4733 Obstructive sleep apnea (adult) (pediatric): Secondary | ICD-10-CM | POA: Diagnosis not present

## 2023-04-30 DIAGNOSIS — K2970 Gastritis, unspecified, without bleeding: Secondary | ICD-10-CM

## 2023-04-30 DIAGNOSIS — F319 Bipolar disorder, unspecified: Secondary | ICD-10-CM | POA: Diagnosis not present

## 2023-04-30 DIAGNOSIS — K3189 Other diseases of stomach and duodenum: Secondary | ICD-10-CM

## 2023-04-30 MED ORDER — SODIUM CHLORIDE 0.9 % IV SOLN: 500.0000 mL | Freq: Once | INTRAVENOUS | Status: DC

## 2023-04-30 NOTE — Progress Notes (Signed)
GASTROENTEROLOGY PROCEDURE H&P NOTE   Primary Care Physician: Dean Howard, Dean Patricia, MD    Reason for Procedure:   GERD and rectal bleeding  Plan:    EGD and colonoscopy  Patient is appropriate for endoscopic procedure(s) in the ambulatory ([facility]) setting.  The nature of the procedure, as well as the risks, benefits, and alternatives were carefully and thoroughly reviewed with the patient. Ample time for discussion and questions allowed. The patient understood, was satisfied, and agreed to proceed.     HPI: Dean Howard. is a 57 y.o. male who presents for EGD and colonoscopy.  Medical history as below.  Tolerated the prep.  No recent chest pain or shortness of breath.  No abdominal pain today.  Past Medical History:  Diagnosis Date   Alcohol abuse    Anxiety    CKD (chronic kidney disease) stage 3, GFR 30-59 ml/min (HCC) 12/11/2022   Coronary artery disease    Depression    Gastroesophageal reflux disease 02/18/2023   Heart murmur    MI, old    Sleep apnea    Tick fever     Past Surgical History:  Procedure Laterality Date   HERNIA REPAIR     KNEE SURGERY      Prior to Admission medications   Medication Sig Start Date End Date Taking? Authorizing Provider  lithium carbonate (ESKALITH) 450 MG CR tablet Take 1 tablet (450 mg total) by mouth at bedtime. 08/05/19  Yes Money, Dean Burdock, FNP  traZODone (DESYREL) 50 MG tablet Take 1 tablet (50 mg total) by mouth at bedtime. 11/30/19  Yes Dean Cal, NP  cariprazine 4.5 MG CAPS Take 1 capsule (4.5 mg total) by mouth daily. 08/06/19   Money, Dean Burdock, FNP    Current Outpatient Medications  Medication Sig Dispense Refill   lithium carbonate (ESKALITH) 450 MG CR tablet Take 1 tablet (450 mg total) by mouth at bedtime. 30 tablet 1   traZODone (DESYREL) 50 MG tablet Take 1 tablet (50 mg total) by mouth at bedtime.     cariprazine 4.5 MG CAPS Take 1 capsule (4.5 mg total) by mouth daily. 30 capsule 1   Current  Facility-Administered Medications  Medication Dose Route Frequency Provider Last Rate Last Admin   0.9 %  sodium chloride infusion  500 mL Intravenous Once Dean Howard, Dean Caddy, MD        Allergies as of 04/30/2023   (No Known Allergies)    Family History  Problem Relation Age of Onset   COPD Mother    Colon cancer Neg Hx    Esophageal cancer Neg Hx    Rectal cancer Neg Hx    Stomach cancer Neg Hx     Social History   Socioeconomic History   Marital status: Married    Spouse name: Not on file   Number of children: Not on file   Years of education: Not on file   Highest education level: Not on file  Occupational History   Not on file  Tobacco Use   Smoking status: Every Day    Current packs/day: 0.00    Average packs/day: 0.5 packs/day for 15.0 years (7.5 ttl pk-yrs)    Types: Cigarettes    Start date: 03/04/2001    Last attempt to quit: 03/04/2016    Years since quitting: 7.1   Smokeless tobacco: Never  Vaping Use   Vaping status: Never Used  Substance and Sexual Activity   Alcohol use: Yes  Alcohol/week: 6.0 standard drinks of alcohol    Types: 6 Cans of beer per week    Comment: 12 oz   Drug use: Yes    Frequency: 30.0 times per week    Types: Marijuana    Comment: tuesdya  11/5   Sexual activity: Yes    Birth control/protection: None  Other Topics Concern   Not on file  Social History Narrative   Not on file   Social Determinants of Health   Financial Resource Strain: Not on file  Food Insecurity: Not on file  Transportation Needs: Not on file  Physical Activity: Not on file  Stress: Not on file  Social Connections: Not on file  Intimate Partner Violence: Not on file    Physical Exam: Vital signs in last 24 hours: @BP  (!) 98/55   Pulse (!) 59   Temp (!) 96.9 F (36.1 C)   Ht 6\' 3"  (1.905 m)   Wt 185 lb (83.9 kg)   SpO2 98%   BMI 23.12 kg/m  GEN: NAD EYE: Sclerae anicteric ENT: MMM CV: Non-tachycardic Pulm: CTA b/l GI: Soft, NT/ND NEURO:   Alert & Oriented x 3   Dean Blinks, MD [facility]  04/30/2023 1:45 PM

## 2023-04-30 NOTE — Progress Notes (Signed)
Report to PACU, RN, vss, BBS= Clear.  

## 2023-04-30 NOTE — Patient Instructions (Addendum)
Resume all of your previous medications today as ordered.  Read allof the handouts given to you by your recovery room nurse.  YOU HAD AN ENDOSCOPIC PROCEDURE TODAY AT THE [facility]:   Refer to the procedure report that was given to you for any specific questions about what was found during the examination.  If the procedure report does not answer your questions, please call your gastroenterologist to clarify.  If you requested that your care partner not be given the details of your procedure findings, then the procedure report has been included in a sealed envelope for you to review at your convenience later.  YOU SHOULD EXPECT: Some feelings of bloating in the abdomen. Passage of more gas than usual.  Walking can help get rid of the air that was put into your GI tract during the procedure and reduce the bloating. If you had a lower endoscopy (such as a colonoscopy or flexible sigmoidoscopy) you may notice spotting of blood in your stool or on the toilet paper. If you underwent a bowel prep for your procedure, you may not have a normal bowel movement for a few days.  Please Note:  You might notice some irritation and congestion in your nose or some drainage.  This is from the oxygen used during your procedure.  There is no need for concern and it should clear up in a day or so.  SYMPTOMS TO REPORT IMMEDIATELY:  Following lower endoscopy (colonoscopy or flexible sigmoidoscopy):  Excessive amounts of blood in the stool  Significant tenderness or worsening of abdominal pains  Swelling of the abdomen that is new, acute  Fever of 100F or higher  Following upper endoscopy (EGD)  Vomiting of blood or coffee ground material  New chest pain or pain under the shoulder blades  Painful or persistently difficult swallowing  New shortness of breath  Fever of 100F or higher  Black, tarry-looking stools  For urgent or emergent issues, a gastroenterologist can be reached at any hour by  calling [phone]. Do not use MyChart messaging for urgent concerns.    DIET:  We do recommend a small meal at first, but then you may proceed to your regular diet.  Drink plenty of fluids but you should avoid alcoholic beverages for 24 hours. Try to eat a high fiber diet, and drink plenty of water.  ACTIVITY:  You should plan to take it easy for the rest of today and you should NOT DRIVE or use heavy machinery until tomorrow (because of the sedation medicines used during the test).    FOLLOW UP: Our staff will call the number listed on your records the next business day following your procedure.  We will call around 7:15- 8:00 am to check on you and address any questions or concerns that you may have regarding the information given to you following your procedure. If we do not reach you, we will leave a message.     If any biopsies were taken you will be contacted by phone or by letter within the next 1-3 weeks.  Please call us at [phone] if you have not heard about the biopsies in 3 weeks.    SIGNATURES/CONFIDENTIALITY: You and/or your care partner have signed paperwork which will be entered into your electronic medical record.  These signatures attest to the fact that that the information above on your After Visit Summary has been reviewed and is understood.  Full responsibility of the confidentiality of this discharge information lies with  you and/or your care-partner.

## 2023-04-30 NOTE — Progress Notes (Signed)
Called to room to assist during endoscopic procedure.  Patient ID and intended procedure confirmed with present staff. Received instructions for my participation in the procedure from the performing physician.  

## 2023-04-30 NOTE — Progress Notes (Signed)
Pt's states no medical or surgical changes since previsit or office visit. 

## 2023-04-30 NOTE — Op Note (Signed)
[facility] Patient Name: Dean Howard Procedure Date: 04/30/2023 1:30 PM MRN: [id number] Endoscopist: Beverley Fiedler , MD, [id number] Age: 57 Referring MD:  Date of Birth: July 25, 1965 Gender: Male Account #: [id number] Procedure:                Colonoscopy Indications:              Rectal bleeding Medicines:                Monitored Anesthesia Care Procedure:                Pre-Anesthesia Assessment:                           - Prior to the procedure, a History and Physical                            was performed, and patient medications and                            allergies were reviewed. The patient's tolerance of                            previous anesthesia was also reviewed. The risks                            and benefits of the procedure and the sedation                            options and risks were discussed with the patient.                            All questions were answered, and informed consent                            was obtained. Prior Anticoagulants: The patient has                            taken no anticoagulant or antiplatelet agents. ASA                            Grade Assessment: III - A patient with severe                            systemic disease. After reviewing the risks and                            benefits, the patient was deemed in satisfactory                            condition to undergo the procedure.                           After obtaining informed consent, the colonoscope  was passed under direct vision. Throughout the                            procedure, the patient's blood pressure, pulse, and                            oxygen saturations were monitored continuously. The                            CF [id number] #[id number] was introduced through the anus                            and advanced to the terminal ileum. The colonoscopy                            was performed without difficulty.  The patient                            tolerated the procedure well. The quality of the                            bowel preparation was good. The terminal ileum,                            ileocecal valve, appendiceal orifice, and rectum                            were photographed. Scope In: 2:04:20 PM Scope Out: 2:13:52 PM Scope Withdrawal Time: 0 hours 8 minutes 1 second  Total Procedure Duration: 0 hours 9 minutes 32 seconds  Findings:                 The digital rectal exam was normal.                           The terminal ileum appeared normal.                           A few medium-mouthed diverticula were found in the                            ascending colon.                           The entire examined colon appeared otherwise normal.                           A 4 mm polyp was found in the anus. The polyp was                            flat. Biopsies were taken with a cold forceps for                            histology.  Internal hemorrhoids were found during                            retroflexion. The hemorrhoids were small. Complications:            No immediate complications. Estimated Blood Loss:     Estimated blood loss was minimal. Impression:               - The examined portion of the ileum was normal.                           - Mild diverticulosis in the ascending colon.                           - The entire examined colon is otherwise normal.                           - One 4 mm polyp at the anus. Biopsied to exclude                            AIN.                           - Small internal hemorrhoids. Recommendation:           - Patient has a contact number available for                            emergencies. The signs and symptoms of potential                            delayed complications were discussed with the                            patient. Return to normal activities tomorrow.                            Written  discharge instructions were provided to the                            patient.                           - Resume previous diet.                           - Continue present medications.                           - Await pathology results.                           - Repeat colonoscopy is recommended. The                            colonoscopy date will be determined after pathology  results from today's exam become available for                            review. Beverley Fiedler, MD 04/30/2023 2:20:25 PM This report has been signed electronically.

## 2023-04-30 NOTE — Op Note (Signed)
[facility] Patient Name: Dean Howard Procedure Date: 04/30/2023 1:31 PM MRN: [id number] Endoscopist: Beverley Fiedler , MD, [id number] Age: 57 Referring MD:  Date of Birth: 1965-10-13 Gender: Male Account #: [id number] Procedure:                Upper GI endoscopy Indications:              Gastro-esophageal reflux disease, improved with                            current BID pantoprazole Medicines:                Monitored Anesthesia Care Procedure:                Pre-Anesthesia Assessment:                           - Prior to the procedure, a History and Physical                            was performed, and patient medications and                            allergies were reviewed. The patient's tolerance of                            previous anesthesia was also reviewed. The risks                            and benefits of the procedure and the sedation                            options and risks were discussed with the patient.                            All questions were answered, and informed consent                            was obtained. Prior Anticoagulants: The patient has                            taken no anticoagulant or antiplatelet agents. ASA                            Grade Assessment: III - A patient with severe                            systemic disease. After reviewing the risks and                            benefits, the patient was deemed in satisfactory                            condition to undergo the procedure.  After obtaining informed consent, the endoscope was                            passed under direct vision. Throughout the                            procedure, the patient's blood pressure, pulse, and                            oxygen saturations were monitored continuously. The                            GIF HQ190 #[id number] was introduced through the                            mouth, and advanced to the  second part of duodenum.                            The upper GI endoscopy was accomplished without                            difficulty. The patient tolerated the procedure                            well. Scope In: Scope Out: Findings:                 The examined esophagus was normal.                           Moderate inflammation characterized by erythema,                            friability and granularity was found at the                            incisura and in the gastric antrum. Biopsies were                            taken with a cold forceps for histology and                            Helicobacter pylori testing.                           A single 10 mm papule (nodule) with no bleeding was                            found in the gastric antrum. Biopsies were taken                            with a cold forceps for histology.                           The examined duodenum was normal. Complications:  No immediate complications. Estimated Blood Loss:     Estimated blood loss was minimal. Impression:               - Normal esophagus.                           - Gastritis. Biopsied.                           - A single papule (nodule) found in the stomach.                            Biopsied.                           - Normal examined duodenum. Recommendation:           - Patient has a contact number available for                            emergencies. The signs and symptoms of potential                            delayed complications were discussed with the                            patient. Return to normal activities tomorrow.                            Written discharge instructions were provided to the                            patient.                           - Resume previous diet.                           - Continue present medications.                           - Await pathology results.                           - See the other procedure note  for documentation of                            additional recommendations. Beverley Fiedler, MD 04/30/2023 2:16:37 PM This report has been signed electronically.

## 2023-05-01 ENCOUNTER — Telehealth: Payer: Self-pay | Admitting: *Deleted

## 2023-05-01 NOTE — Telephone Encounter (Signed)
  Follow up Call-     04/30/2023   12:46 PM  Call back number  Post procedure Call Back phone  # [phone]  Permission to leave phone message Yes   [facility]

## 2023-05-05 LAB — SURGICAL PATHOLOGY

## 2023-05-11 ENCOUNTER — Encounter: Payer: Self-pay | Admitting: Internal Medicine

## 2023-05-26 ENCOUNTER — Other Ambulatory Visit: Payer: Self-pay | Admitting: Internal Medicine

## 2023-05-26 ENCOUNTER — Other Ambulatory Visit: Payer: Medicare HMO

## 2023-05-26 ENCOUNTER — Ambulatory Visit: Payer: Medicare HMO | Admitting: Internal Medicine

## 2023-05-26 DIAGNOSIS — F259 Schizoaffective disorder, unspecified: Secondary | ICD-10-CM

## 2023-05-27 LAB — LITHIUM LEVEL: Lithium Lvl: 0.4 mmol/L — ABNORMAL LOW (ref 0.6–1.2)

## 2023-06-03 ENCOUNTER — Encounter: Payer: Self-pay | Admitting: Internal Medicine

## 2023-06-08 ENCOUNTER — Telehealth: Payer: Self-pay | Admitting: Internal Medicine

## 2023-06-08 NOTE — Telephone Encounter (Signed)
Left message on machine returning patient's call 

## 2023-06-08 NOTE — Telephone Encounter (Signed)
Pt stated he is returning your call and want a call back about his labs.

## 2023-06-10 NOTE — Telephone Encounter (Signed)
Left message on machine for patient to return our call 

## 2023-06-11 NOTE — Telephone Encounter (Signed)
3rd attempt to call the patient.  Left message on machine for patient to return our call.

## 2023-07-27 DIAGNOSIS — F250 Schizoaffective disorder, bipolar type: Secondary | ICD-10-CM | POA: Diagnosis not present

## 2023-10-01 DIAGNOSIS — F250 Schizoaffective disorder, bipolar type: Secondary | ICD-10-CM | POA: Diagnosis not present

## 2023-10-26 DIAGNOSIS — F1020 Alcohol dependence, uncomplicated: Secondary | ICD-10-CM | POA: Diagnosis not present

## 2023-10-26 DIAGNOSIS — F17210 Nicotine dependence, cigarettes, uncomplicated: Secondary | ICD-10-CM | POA: Diagnosis not present

## 2023-10-26 DIAGNOSIS — F1220 Cannabis dependence, uncomplicated: Secondary | ICD-10-CM | POA: Diagnosis not present

## 2023-10-26 DIAGNOSIS — F4310 Post-traumatic stress disorder, unspecified: Secondary | ICD-10-CM | POA: Diagnosis not present

## 2023-10-26 DIAGNOSIS — Z653 Problems related to other legal circumstances: Secondary | ICD-10-CM | POA: Diagnosis not present

## 2023-10-26 DIAGNOSIS — F250 Schizoaffective disorder, bipolar type: Secondary | ICD-10-CM | POA: Diagnosis not present

## 2023-10-26 DIAGNOSIS — F17200 Nicotine dependence, unspecified, uncomplicated: Secondary | ICD-10-CM | POA: Diagnosis not present

## 2024-03-30 DIAGNOSIS — F250 Schizoaffective disorder, bipolar type: Secondary | ICD-10-CM | POA: Diagnosis not present
